# Patient Record
Sex: Female | Born: 1960 | Race: White | Hispanic: No | Marital: Married | Smoking: Never smoker
Health system: Southern US, Community
[De-identification: ages and names within clinical notes are randomized; demographics above are authoritative.]

## PROBLEM LIST (undated history)

## (undated) DIAGNOSIS — K219 Gastro-esophageal reflux disease without esophagitis: Secondary | ICD-10-CM

## (undated) DIAGNOSIS — F419 Anxiety disorder, unspecified: Secondary | ICD-10-CM

## (undated) DIAGNOSIS — I447 Left bundle-branch block, unspecified: Secondary | ICD-10-CM

## (undated) DIAGNOSIS — J309 Allergic rhinitis, unspecified: Secondary | ICD-10-CM

## (undated) DIAGNOSIS — E119 Type 2 diabetes mellitus without complications: Secondary | ICD-10-CM

## (undated) DIAGNOSIS — I10 Essential (primary) hypertension: Secondary | ICD-10-CM

## (undated) DIAGNOSIS — E785 Hyperlipidemia, unspecified: Secondary | ICD-10-CM

## (undated) DIAGNOSIS — C801 Malignant (primary) neoplasm, unspecified: Secondary | ICD-10-CM

## (undated) DIAGNOSIS — R0600 Dyspnea, unspecified: Secondary | ICD-10-CM

## (undated) DIAGNOSIS — J45909 Unspecified asthma, uncomplicated: Secondary | ICD-10-CM

## (undated) DIAGNOSIS — J398 Other specified diseases of upper respiratory tract: Secondary | ICD-10-CM

## (undated) DIAGNOSIS — E039 Hypothyroidism, unspecified: Secondary | ICD-10-CM

## (undated) HISTORY — DX: Unspecified asthma, uncomplicated: J45.909

## (undated) HISTORY — PX: TONSILLECTOMY: SUR1361

## (undated) HISTORY — PX: BREAST SURGERY: SHX581

## (undated) HISTORY — DX: Essential (primary) hypertension: I10

## (undated) HISTORY — DX: Hyperlipidemia, unspecified: E78.5

## (undated) HISTORY — PX: COLONOSCOPY: SHX174

## (undated) HISTORY — DX: Allergic rhinitis, unspecified: J30.9

## (undated) HISTORY — PX: DILATION AND CURETTAGE OF UTERUS: SHX78

## (undated) HISTORY — DX: Left bundle-branch block, unspecified: I44.7

## (undated) HISTORY — DX: Other specified diseases of upper respiratory tract: J39.8

---

## 2017-07-09 DIAGNOSIS — Z9289 Personal history of other medical treatment: Secondary | ICD-10-CM

## 2017-07-09 HISTORY — DX: Personal history of other medical treatment: Z92.89

## 2017-11-22 ENCOUNTER — Institutional Professional Consult (permissible substitution): Payer: Self-pay | Admitting: Pulmonary Disease

## 2017-12-16 ENCOUNTER — Encounter: Payer: Self-pay | Admitting: Emergency Medicine

## 2017-12-16 ENCOUNTER — Ambulatory Visit: Payer: BLUE CROSS/BLUE SHIELD | Admitting: Emergency Medicine

## 2017-12-16 VITALS — BP 120/78 | HR 76 | Ht 61.0 in | Wt 197.4 lb

## 2017-12-16 DIAGNOSIS — R062 Wheezing: Secondary | ICD-10-CM | POA: Diagnosis not present

## 2017-12-16 DIAGNOSIS — R053 Chronic cough: Secondary | ICD-10-CM

## 2017-12-16 DIAGNOSIS — R05 Cough: Secondary | ICD-10-CM | POA: Diagnosis not present

## 2017-12-16 MED ORDER — VALSARTAN 160 MG PO TABS: 160.0000 mg | ORAL_TABLET | Freq: Every day | ORAL | 4 refills | Status: DC

## 2017-12-16 NOTE — Progress Notes (Signed)
Subjective:    Patient ID: Caitlyn Anderson, female    DOB: Jan 11, 1961, 57 y.o.   MRN: [id number]  HPI 57 year old woman, never smoker, with a history of hypertension (on lisinopril) left bundle branch block, diabetes, hypercholesterolemia, allergic rhinitis. She was given a diagnosis of asthma around age 46.   She is a self referral today for SOB, noise with breathing, cough. She has a lot of nasal congestion, underwent allergy testing in the past and was on immunotherapy. May have helped her some. She has anxiety as well, wonders if this could be a contributor.   She describes severe SOB w exertion, even w walking short distance. Localizes noise to her UA nad it is loud, easy to hear by others. She coughs every day, worse in the am and sometimes at night, does not wake her from sleep. She is on flonase. She has been on symbicort for 3 yrs, unclear whether it helps her. She also use to be on loratadine. She has been on lisinopril for several years. She has GERD, uses TUMS about once a week.    Review of Systems  Constitutional: Negative for fever and unexpected weight change.  HENT: Positive for congestion, postnasal drip, rhinorrhea, sinus pressure and sneezing. Negative for dental problem, ear pain, nosebleeds, sore throat and trouble swallowing.   Eyes: Positive for itching. Negative for redness.  Respiratory: Positive for cough, chest tightness, shortness of breath and wheezing.   Cardiovascular: Positive for palpitations. Negative for leg swelling.  Gastrointestinal: Negative for nausea and vomiting.  Genitourinary: Negative for dysuria.  Musculoskeletal: Negative for joint swelling.  Skin: Negative for rash.  Allergic/Immunologic: Positive for environmental allergies. Negative for food allergies and immunocompromised state.  Neurological: Negative for headaches.  Hematological: Does not bruise/bleed easily.  Psychiatric/Behavioral: Negative for dysphoric mood. The patient is  nervous/anxious.    Past Medical History:  Diagnosis Date  . Allergic rhinitis   . Asthma   . Hyperlipidemia   . Hypertension   . Left bundle branch block      No family history on file.   Social History   Socioeconomic History  . Marital status: Married    Spouse name: Not on file  . Number of children: Not on file  . Years of education: Not on file  . Highest education level: Not on file  Occupational History  . Not on file  Social Needs  . Financial resource strain: Not on file  . Food insecurity:    Worry: Not on file    Inability: Not on file  . Transportation needs:    Medical: Not on file    Non-medical: Not on file  Tobacco Use  . Smoking status: Never Smoker  . Smokeless tobacco: Never Used  Substance and Sexual Activity  . Alcohol use: Not on file  . Drug use: Not on file  . Sexual activity: Not on file  Lifestyle  . Physical activity:    Days per week: Not on file    Minutes per session: Not on file  . Stress: Not on file  Relationships  . Social connections:    Talks on phone: Not on file    Gets together: Not on file    Attends religious service: Not on file    Active member of club or organization: Not on file    Attends meetings of clubs or organizations: Not on file    Relationship status: Not on file  . Intimate partner violence:    Fear  of current or ex partner: Not on file    Emotionally abused: Not on file    Physically abused: Not on file    Forced sexual activity: Not on file  Other Topics Concern  . Not on file  Social History Narrative  . 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   Allergies  Allergen Reactions  . Penicillins Hives and Rash     Outpatient Medications Prior to Visit  Medication Sig Dispense Refill  . albuterol (PROAIR HFA) 108 (90 Base) MCG/ACT inhaler Inhale 2 puffs into the lungs 4 (four) times daily as needed.    Marland Kitchen aspirin 81 MG chewable tablet Chew 81 mg  by mouth daily.    Marland Kitchen atorvastatin (LIPITOR) 10 MG tablet Take 2 tablets by mouth daily.    . budesonide-formoterol (SYMBICORT) 160-4.5 MCG/ACT inhaler Take 2 puffs by mouth 2 (two) times daily.    . Cholecalciferol 1000 units capsule Take 1 capsule by mouth daily.    . cycloSPORINE (RESTASIS) 0.05 % ophthalmic emulsion 1 drop 2 (two) times daily.    Marland Kitchen escitalopram (LEXAPRO) 10 MG tablet Take 1 capsule by mouth daily.    . fluticasone (FLONASE) 50 MCG/ACT nasal spray Inhale 1 spray into the lungs daily.    Marland Kitchen levothyroxine (SYNTHROID, LEVOTHROID) 75 MCG tablet Take 75 mcg by mouth daily before breakfast.    . metFORMIN (GLUCOPHAGE) 1000 MG tablet Take 1 capsule by mouth daily.    Marland Kitchen lisinopril (PRINIVIL,ZESTRIL) 10 MG tablet Take 1 capsule by mouth daily.     No facility-administered medications prior to visit.         Objective:   Physical Exam Vitals:   12/16/17 1628 12/16/17 1632  BP:  120/78  Pulse:  76  SpO2:  95%  Weight: 197 lb 6.4 oz (89.5 kg)   Height: 5\' 1"  (1.549 m)    Gen: Pleasant, overwt woman, in no distress,  normal affect  ENT: No lesions,  mouth clear,  oropharynx clear, no postnasal drip  Neck: No JVD, she does have some soft insp and exp stridor  Lungs: No use of accessory muscles, soft exp wheeze or referred UA noise  Cardiovascular: RRR, heart sounds normal, no murmur or gallops, trace peripheral edema  Musculoskeletal: No deformities, no cyanosis or clubbing  Neuro: alert, non focal  Skin: Warm, no lesions or rash      Assessment & Plan:  Wheezing She carries a diagnosis of asthma although she clearly describes upper airway symptoms.  She is not sure that her Symbicort or albuterol ever change her symptoms very much.  She very well may have some asthma but I believe that upper airway instability is driving most of her daily symptoms and problems.  We will need to tease out how much true obstruction she has and decide how to treat.  For now would like  to stop the Symbicort since could be an upper airway irritant.  We will stop her lisinopril and change to valsartan.  We will try to treat allergic rhinitis more aggressively although she may end up needing to see an allergist to be back on immunotherapy.  Unclear how much GERD she may have but she does describe some.  We may decide to start her on empiric GERD therapy as we go forward.  She needs full pulmonary function testing for [place] to determine whether or bronchodilators are indicated.  We will do this  at her next visit.  Please stop Symbicort for now. Keep your albuterol available to use 2 puffs if needed for shortness of breath, chest tightness, cough, wheezing. We will perform full pulmonary function testing at your next visit.  Stop lisinopril We will start valsartan 160 mg once a day. Please continue fluticasone nasal spray, take 2 sprays each nostril once a day. Please start Zyrtec 10 mg once daily. Depending on how you do we may need to refer you back to an allergist to consider allergy shots again. Keep track of how often you have reflux symptoms, how often you use Tums.  We will talk about this at your next visit. Follow with Dr Lamonte Sakai in 1 month or next available with PFT on the same day  Baltazar Apo, MD, PhD 12/16/2017, 5:25 PM [facility] [phone] or if no answer [phone]

## 2017-12-16 NOTE — Patient Instructions (Addendum)
Please stop Symbicort for now. Keep your albuterol available to use 2 puffs if needed for shortness of breath, chest tightness, cough, wheezing. We will perform full pulmonary function testing at your next visit.  Stop lisinopril We will start valsartan 160 mg once a day. Please continue fluticasone nasal spray, take 2 sprays each nostril once a day. Please start Zyrtec 10 mg once daily. Depending on how you do we may need to refer you back to an allergist to consider allergy shots again. Keep track of how often you have reflux symptoms, how often you use Tums.  We will talk about this at your next visit. Follow with Caitlyn Anderson in 1 month or next available with PFT on the same day

## 2017-12-16 NOTE — Assessment & Plan Note (Signed)
She carries a diagnosis of asthma although she clearly describes upper airway symptoms.  She is not sure that her Symbicort or albuterol ever change her symptoms very much.  She very well may have some asthma but I believe that upper airway instability is driving most of her daily symptoms and problems.  We will need to tease out how much true obstruction she has and decide how to treat.  For now would like to stop the Symbicort since could be an upper airway irritant.  We will stop her lisinopril and change to valsartan.  We will try to treat allergic rhinitis more aggressively although she may end up needing to see an allergist to be back on immunotherapy.  Unclear how much GERD she may have but she does describe some.  We may decide to start her on empiric GERD therapy as we go forward.  She needs full pulmonary function testing for [place] to determine whether or bronchodilators are indicated.  We will do this at her next visit.  Please stop Symbicort for now. Keep your albuterol available to use 2 puffs if needed for shortness of breath, chest tightness, cough, wheezing. We will perform full pulmonary function testing at your next visit.  Stop lisinopril We will start valsartan 160 mg once a day. Please continue fluticasone nasal spray, take 2 sprays each nostril once a day. Please start Zyrtec 10 mg once daily. Depending on how you do we may need to refer you back to an allergist to consider allergy shots again. Keep track of how often you have reflux symptoms, how often you use Tums.  We will talk about this at your next visit. Follow with Dr Lamonte Sakai in 1 month or next available with PFT on the same day

## 2018-01-03 ENCOUNTER — Telehealth: Payer: Self-pay | Admitting: Emergency Medicine

## 2018-01-03 MED ORDER — PREDNISONE 10 MG PO TABS: ORAL_TABLET | ORAL | 0 refills | Status: DC

## 2018-01-03 NOTE — Telephone Encounter (Signed)
Called and spoke with patient she is aware. Medication sent in.

## 2018-01-03 NOTE — Telephone Encounter (Signed)
Have her take prednisone 40mg  daily for 5 days. Then call us to let us know if she is improved.

## 2018-01-03 NOTE — Telephone Encounter (Signed)
Called and spoke with patient regarding not breathing well in last 4 days. Pt last seen RB on 12/16/17 for chronic cough. Pt reports increase of SOB at rest and more with exertion, worsening wheezing, dry cough. Pt denies chest tightness, denies any fever or chills. Pt is using albuterol inhaler 2 puffs 3 times daily, valsartan daily, fluticasone daily, and zyrtec daily. Offered a acute ov today; pt denies as she lives in [place]. Pt is requesting if RB could send any medication in to help her feel better.  RB please advise

## 2018-01-28 ENCOUNTER — Ambulatory Visit (INDEPENDENT_AMBULATORY_CARE_PROVIDER_SITE_OTHER): Payer: BLUE CROSS/BLUE SHIELD | Admitting: Emergency Medicine

## 2018-01-28 ENCOUNTER — Ambulatory Visit: Payer: BLUE CROSS/BLUE SHIELD | Admitting: Emergency Medicine

## 2018-01-28 ENCOUNTER — Encounter: Payer: Self-pay | Admitting: Emergency Medicine

## 2018-01-28 DIAGNOSIS — R062 Wheezing: Secondary | ICD-10-CM

## 2018-01-28 DIAGNOSIS — R0600 Dyspnea, unspecified: Secondary | ICD-10-CM | POA: Insufficient documentation

## 2018-01-28 DIAGNOSIS — R0609 Other forms of dyspnea: Secondary | ICD-10-CM

## 2018-01-28 DIAGNOSIS — R05 Cough: Secondary | ICD-10-CM | POA: Diagnosis not present

## 2018-01-28 DIAGNOSIS — R053 Chronic cough: Secondary | ICD-10-CM

## 2018-01-28 LAB — PULMONARY FUNCTION TEST
DL/VA % pred: 93 %
DL/VA: 3.97 ml/min/mmHg/L
DLCO UNC % PRED: 93 %
DLCO UNC: 17.63 ml/min/mmHg
FEF 25-75 POST: 1.05 L/s
FEF 25-75 PRE: 1.33 L/s
FEF2575-%CHANGE-POST: -20 %
FEF2575-%PRED-POST: 48 %
FEF2575-%PRED-PRE: 61 %
FEV1-%Change-Post: -11 %
FEV1-%Pred-Post: 76 %
FEV1-%Pred-Pre: 86 %
FEV1-Post: 1.7 L
FEV1-Pre: 1.92 L
FEV1FVC-%CHANGE-POST: 0 %
FEV1FVC-%PRED-PRE: 83 %
FEV6-%CHANGE-POST: -12 %
FEV6-%Pred-Post: 92 %
FEV6-%Pred-Pre: 105 %
FEV6-POST: 2.54 L
FEV6-Pre: 2.9 L
FEV6FVC-%CHANGE-POST: 0 %
FEV6FVC-%Pred-Post: 102 %
FEV6FVC-%Pred-Pre: 102 %
FVC-%CHANGE-POST: -10 %
FVC-%PRED-POST: 91 %
FVC-%Pred-Pre: 102 %
FVC-Post: 2.61 L
FVC-Pre: 2.93 L
PRE FEV1/FVC RATIO: 65 %
Post FEV1/FVC ratio: 65 %
Post FEV6/FVC ratio: 99 %
Pre FEV6/FVC Ratio: 99 %
RV % pred: 143 %
RV: 2.53 L
TLC % pred: 118 %
TLC: 5.26 L

## 2018-01-28 NOTE — Assessment & Plan Note (Signed)
Exertional shortness of breath that is almost always associated with stridor.  Her pulmonary function testing shows some possible mild obstruction but as mentioned evidence for a fixed obstruction.  I believe this needs to be evaluated as above.  Keep albuterol available to use as needed.  No indication for schedule bronchodilator at this time.

## 2018-01-28 NOTE — Assessment & Plan Note (Signed)
Pulmonary function testing show some mild obstruction, must consider possible presence of mild intermittent asthma.  More interestingly the flow volume loops show evidence for a fixed obstruction in the upper airway.  Certainly she may have a component of VCD or intermittent obstruction but her flow volume loops are all consistent and I am concerned about a fixed lesion.  We talked about this in detail.  We will arrange for a bronchoscopy to inspect her airways.  Continue her albuterol as needed for now.  We will continue to try and treat potential contributors to upper airway irritation including GERD and rhinitis.  We will arrange for bronchoscopy as above.

## 2018-01-28 NOTE — Patient Instructions (Addendum)
Please continue Zyrtec and take his own nasal spray as you have been taking it. Please start Nexium 40 mg daily until next visit.  Take this medication 1 hour before eating. We will not restart Symbicort at this time. Keep albuterol available to use 2 puffs if needed for shortness of breath, wheezing, chest tightness. We will arrange for bronchoscopy to inspect your upper airway, trachea and bronchial tubes. Follow with Dr Lamonte Sakai in 1 month or next available.

## 2018-01-28 NOTE — H&P (View-Only) (Signed)
Subjective:    Patient ID: Caitlyn Anderson, female    DOB: [birth year], 59 y.o.   MRN: [id number]  HPI 57 year old woman, never smoker, with a history of hypertension (on lisinopril) left bundle branch block, diabetes, hypercholesterolemia, allergic rhinitis. She was given a diagnosis of asthma around age 82.   She is a self referral today for SOB, noise with breathing, cough. She has a lot of nasal congestion, underwent allergy testing in the past and was on immunotherapy. May have helped her some. She has anxiety as well, wonders if this could be a contributor.   She describes severe SOB w exertion, even w walking short distance. Localizes noise to her UA nad it is loud, easy to hear by others. She coughs every day, worse in the am and sometimes at night, does not wake her from sleep. She is on flonase. She has been on symbicort for 3 yrs, unclear whether it helps her. She also use to be on loratadine. She has been on lisinopril for several years. She has GERD, uses TUMS about once a week.   ROV 01/28/18 --this is a follow-up visit and a never smoker for dyspnea on exertion, stridor, cough.  At her initial visit I stopped her lisinopril and change to valsartan.  We also stopped Symbicort.  We added Zyrtec to fluticasone nasal spray.  She tolerated stopping Symbicort. Question whether she may have GERD that needs to be treated as well -   Uses Tums.  She reports some increased GERD sx. She reports today that her cough is improved but she continues to have upper airway noise and what sounds like stridor. Since last visit she was treated x 1 with prednisone for increased dyspnea and tightness. Some improvement in PND.   She underwent pulmonary function testing on 7/23 and I have reviewed.  This shows evidence for mild obstruction with out a clear bronchodilator response.  Interestingly her flow volume loop has a pattern consistent with a possible fixed intrathoracic obstruction.  Her lung volumes are  hyperinflated and her diffusion capacity is normal.    Review of Systems  Constitutional: Negative for fever and unexpected weight change.  HENT: Positive for congestion, postnasal drip, rhinorrhea, sinus pressure and sneezing. Negative for dental problem, ear pain, nosebleeds, sore throat and trouble swallowing.   Eyes: Positive for itching. Negative for redness.  Respiratory: Positive for cough, chest tightness, shortness of breath and wheezing.   Cardiovascular: Positive for palpitations. Negative for leg swelling.  Gastrointestinal: Negative for nausea and vomiting.  Genitourinary: Negative for dysuria.  Musculoskeletal: Negative for joint swelling.  Skin: Negative for rash.  Allergic/Immunologic: Positive for environmental allergies. Negative for food allergies and immunocompromised state.  Neurological: Negative for headaches.  Hematological: Does not bruise/bleed easily.  Psychiatric/Behavioral: Negative for dysphoric mood. The patient is nervous/anxious.    Past Medical History:  Diagnosis Date  . Allergic rhinitis   . Asthma   . Hyperlipidemia   . Hypertension   . Left bundle branch block      History reviewed. No pertinent family history.   Social History   Socioeconomic History  . Marital status: Married    Spouse name: Not on file  . Number of children: Not on file  . Years of education: Not on file  . Highest education level: Not on file  Occupational History  . Not on file  Social Needs  . Financial resource strain: Not on file  . Food insecurity:    Worry: Not on  file    Inability: Not on file  . Transportation needs:    Medical: Not on file    Non-medical: Not on file  Tobacco Use  . Smoking status: Never Smoker  . Smokeless tobacco: Never Used  Substance and Sexual Activity  . Alcohol use: Not on file  . Drug use: Not on file  . Sexual activity: Not on file  Lifestyle  . Physical activity:    Days per week: Not on file    Minutes per session:  Not on file  . Stress: Not on file  Relationships  . Social connections:    Talks on phone: Not on file    Gets together: Not on file    Attends religious service: Not on file    Active member of club or organization: Not on file    Attends meetings of clubs or organizations: Not on file    Relationship status: Not on file  . Intimate partner violence:    Fear of current or ex partner: Not on file    Emotionally abused: Not on file    Physically abused: Not on file    Forced sexual activity: Not on file  Other Topics Concern  . Not on file  Social History Narrative  . 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   Allergies  Allergen Reactions  . Penicillins Hives and Rash     Outpatient Medications Prior to Visit  Medication Sig Dispense Refill  . albuterol (PROAIR HFA) 108 (90 Base) MCG/ACT inhaler Inhale 2 puffs into the lungs 4 (four) times daily as needed.    Marland Kitchen aspirin 81 MG chewable tablet Chew 81 mg by mouth daily.    Marland Kitchen atorvastatin (LIPITOR) 10 MG tablet Take 2 tablets by mouth daily.    . budesonide-formoterol (SYMBICORT) 160-4.5 MCG/ACT inhaler Take 2 puffs by mouth 2 (two) times daily.    . Cholecalciferol 1000 units capsule Take 1 capsule by mouth daily.    Marland Kitchen escitalopram (LEXAPRO) 10 MG tablet Take 1 capsule by mouth daily.    . fluticasone (FLONASE) 50 MCG/ACT nasal spray Inhale 1 spray into the lungs daily.    Marland Kitchen levothyroxine (SYNTHROID, LEVOTHROID) 75 MCG tablet Take 75 mcg by mouth daily before breakfast.    . metFORMIN (GLUCOPHAGE) 1000 MG tablet Take 1 capsule by mouth daily.    . valsartan (DIOVAN) 160 MG tablet Take 1 tablet (160 mg total) by mouth daily. 31 tablet 4  . cycloSPORINE (RESTASIS) 0.05 % ophthalmic emulsion 1 drop 2 (two) times daily.    . predniSONE (DELTASONE) 10 MG tablet Take 40 mg daily for 5 days 20 tablet 0   No facility-administered medications prior to visit.         Objective:    Physical Exam Vitals:   01/28/18 1514  BP: 122/84  Pulse: (!) 104  SpO2: 92%   Gen: Pleasant, overwt woman, in no distress,  normal affect  ENT: No lesions,  mouth clear,  oropharynx clear, no postnasal drip  Neck: No JVD, she does have some soft insp and exp stridor  Lungs: No use of accessory muscles, soft exp wheeze or referred UA noise  Cardiovascular: RRR, heart sounds normal, no murmur or gallops, trace peripheral edema  Musculoskeletal: No deformities, no cyanosis or clubbing  Neuro: alert, non focal  Skin: Warm, no lesions or rash      Assessment & Plan:  Wheezing  Pulmonary function testing show some mild obstruction, must consider possible presence of mild intermittent asthma.  More interestingly the flow volume loops show evidence for a fixed obstruction in the upper airway.  Certainly she may have a component of VCD or intermittent obstruction but her flow volume loops are all consistent and I am concerned about a fixed lesion.  We talked about this in detail.  We will arrange for a bronchoscopy to inspect her airways.  Continue her albuterol as needed for now.  We will continue to try and treat potential contributors to upper airway irritation including GERD and rhinitis.  We will arrange for bronchoscopy as above.  Dyspnea Exertional shortness of breath that is almost always associated with stridor.  Her pulmonary function testing shows some possible mild obstruction but as mentioned evidence for a fixed obstruction.  I believe this needs to be evaluated as above.  Keep albuterol available to use as needed.  No indication for schedule bronchodilator at this time.  Baltazar Apo, MD, PhD 01/28/2018, 5:34 PM [facility] [phone] or if no answer [phone]

## 2018-01-28 NOTE — Progress Notes (Signed)
Subjective:    Patient ID: Caitlyn Anderson, female    DOB: 10-Oct-1960, 57 y.o.   MRN: [id number]  HPI 57 year old woman, never smoker, with a history of hypertension (on lisinopril) left bundle branch block, diabetes, hypercholesterolemia, allergic rhinitis. She was given a diagnosis of asthma around age 10.   She is a self referral today for SOB, noise with breathing, cough. She has a lot of nasal congestion, underwent allergy testing in the past and was on immunotherapy. May have helped her some. She has anxiety as well, wonders if this could be a contributor.   She describes severe SOB w exertion, even w walking short distance. Localizes noise to her UA nad it is loud, easy to hear by others. She coughs every day, worse in the am and sometimes at night, does not wake her from sleep. She is on flonase. She has been on symbicort for 3 yrs, unclear whether it helps her. She also use to be on loratadine. She has been on lisinopril for several years. She has GERD, uses TUMS about once a week.   ROV 01/28/18 --this is a follow-up visit and a never smoker for dyspnea on exertion, stridor, cough.  At her initial visit I stopped her lisinopril and change to valsartan.  We also stopped Symbicort.  We added Zyrtec to fluticasone nasal spray.  She tolerated stopping Symbicort. Question whether she may have GERD that needs to be treated as well -   Uses Tums.  She reports some increased GERD sx. She reports today that her cough is improved but she continues to have upper airway noise and what sounds like stridor. Since last visit she was treated x 1 with prednisone for increased dyspnea and tightness. Some improvement in PND.   She underwent pulmonary function testing on 7/23 and I have reviewed.  This shows evidence for mild obstruction with out a clear bronchodilator response.  Interestingly her flow volume loop has a pattern consistent with a possible fixed intrathoracic obstruction.  Her lung volumes are  hyperinflated and her diffusion capacity is normal.    Review of Systems  Constitutional: Negative for fever and unexpected weight change.  HENT: Positive for congestion, postnasal drip, rhinorrhea, sinus pressure and sneezing. Negative for dental problem, ear pain, nosebleeds, sore throat and trouble swallowing.   Eyes: Positive for itching. Negative for redness.  Respiratory: Positive for cough, chest tightness, shortness of breath and wheezing.   Cardiovascular: Positive for palpitations. Negative for leg swelling.  Gastrointestinal: Negative for nausea and vomiting.  Genitourinary: Negative for dysuria.  Musculoskeletal: Negative for joint swelling.  Skin: Negative for rash.  Allergic/Immunologic: Positive for environmental allergies. Negative for food allergies and immunocompromised state.  Neurological: Negative for headaches.  Hematological: Does not bruise/bleed easily.  Psychiatric/Behavioral: Negative for dysphoric mood. The patient is nervous/anxious.    Past Medical History:  Diagnosis Date  . Allergic rhinitis   . Asthma   . Hyperlipidemia   . Hypertension   . Left bundle branch block      History reviewed. No pertinent family history.   Social History   Socioeconomic History  . Marital status: Married    Spouse name: Not on file  . Number of children: Not on file  . Years of education: Not on file  . Highest education level: Not on file  Occupational History  . Not on file  Social Needs  . Financial resource strain: Not on file  . Food insecurity:    Worry: Not on  file    Inability: Not on file  . Transportation needs:    Medical: Not on file    Non-medical: Not on file  Tobacco Use  . Smoking status: Never Smoker  . Smokeless tobacco: Never Used  Substance and Sexual Activity  . Alcohol use: Not on file  . Drug use: Not on file  . Sexual activity: Not on file  Lifestyle  . Physical activity:    Days per week: Not on file    Minutes per session:  Not on file  . Stress: Not on file  Relationships  . Social connections:    Talks on phone: Not on file    Gets together: Not on file    Attends religious service: Not on file    Active member of club or organization: Not on file    Attends meetings of clubs or organizations: Not on file    Relationship status: Not on file  . Intimate partner violence:    Fear of current or ex partner: Not on file    Emotionally abused: Not on file    Physically abused: Not on file    Forced sexual activity: Not on file  Other Topics Concern  . Not on file  Social History Narrative  . 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   Allergies  Allergen Reactions  . Penicillins Hives and Rash     Outpatient Medications Prior to Visit  Medication Sig Dispense Refill  . albuterol (PROAIR HFA) 108 (90 Base) MCG/ACT inhaler Inhale 2 puffs into the lungs 4 (four) times daily as needed.    Marland Kitchen aspirin 81 MG chewable tablet Chew 81 mg by mouth daily.    Marland Kitchen atorvastatin (LIPITOR) 10 MG tablet Take 2 tablets by mouth daily.    . budesonide-formoterol (SYMBICORT) 160-4.5 MCG/ACT inhaler Take 2 puffs by mouth 2 (two) times daily.    . Cholecalciferol 1000 units capsule Take 1 capsule by mouth daily.    Marland Kitchen escitalopram (LEXAPRO) 10 MG tablet Take 1 capsule by mouth daily.    . fluticasone (FLONASE) 50 MCG/ACT nasal spray Inhale 1 spray into the lungs daily.    Marland Kitchen levothyroxine (SYNTHROID, LEVOTHROID) 75 MCG tablet Take 75 mcg by mouth daily before breakfast.    . metFORMIN (GLUCOPHAGE) 1000 MG tablet Take 1 capsule by mouth daily.    . valsartan (DIOVAN) 160 MG tablet Take 1 tablet (160 mg total) by mouth daily. 31 tablet 4  . cycloSPORINE (RESTASIS) 0.05 % ophthalmic emulsion 1 drop 2 (two) times daily.    . predniSONE (DELTASONE) 10 MG tablet Take 40 mg daily for 5 days 20 tablet 0   No facility-administered medications prior to visit.         Objective:    Physical Exam Vitals:   01/28/18 1514  BP: 122/84  Pulse: (!) 104  SpO2: 92%   Gen: Pleasant, overwt woman, in no distress,  normal affect  ENT: No lesions,  mouth clear,  oropharynx clear, no postnasal drip  Neck: No JVD, she does have some soft insp and exp stridor  Lungs: No use of accessory muscles, soft exp wheeze or referred UA noise  Cardiovascular: RRR, heart sounds normal, no murmur or gallops, trace peripheral edema  Musculoskeletal: No deformities, no cyanosis or clubbing  Neuro: alert, non focal  Skin: Warm, no lesions or rash      Assessment & Plan:  Wheezing  Pulmonary function testing show some mild obstruction, must consider possible presence of mild intermittent asthma.  More interestingly the flow volume loops show evidence for a fixed obstruction in the upper airway.  Certainly she may have a component of VCD or intermittent obstruction but her flow volume loops are all consistent and I am concerned about a fixed lesion.  We talked about this in detail.  We will arrange for a bronchoscopy to inspect her airways.  Continue her albuterol as needed for now.  We will continue to try and treat potential contributors to upper airway irritation including GERD and rhinitis.  We will arrange for bronchoscopy as above.  Dyspnea Exertional shortness of breath that is almost always associated with stridor.  Her pulmonary function testing shows some possible mild obstruction but as mentioned evidence for a fixed obstruction.  I believe this needs to be evaluated as above.  Keep albuterol available to use as needed.  No indication for schedule bronchodilator at this time.  Baltazar Apo, MD, PhD 01/28/2018, 5:34 PM [facility] [phone] or if no answer [phone]

## 2018-01-28 NOTE — Progress Notes (Signed)
PFT done today. 

## 2018-02-03 ENCOUNTER — Encounter (HOSPITAL_COMMUNITY): Payer: Self-pay | Admitting: Respiratory Therapy

## 2018-02-03 ENCOUNTER — Ambulatory Visit (HOSPITAL_COMMUNITY)
Admission: RE | Admit: 2018-02-03 | Discharge: 2018-02-03 | Disposition: A | Discharge status: Home / Self Care | Payer: BLUE CROSS/BLUE SHIELD | Source: Ambulatory Visit | Attending: Emergency Medicine | Admitting: Emergency Medicine

## 2018-02-03 ENCOUNTER — Encounter (HOSPITAL_COMMUNITY)
Admission: RE | Disposition: A | Discharge status: Home / Self Care | Payer: Self-pay | Source: Ambulatory Visit | Attending: Emergency Medicine

## 2018-02-03 DIAGNOSIS — J398 Other specified diseases of upper respiratory tract: Secondary | ICD-10-CM

## 2018-02-03 DIAGNOSIS — Z7984 Long term (current) use of oral hypoglycemic drugs: Secondary | ICD-10-CM | POA: Diagnosis not present

## 2018-02-03 DIAGNOSIS — R061 Stridor: Secondary | ICD-10-CM | POA: Diagnosis not present

## 2018-02-03 DIAGNOSIS — R062 Wheezing: Secondary | ICD-10-CM

## 2018-02-03 DIAGNOSIS — K219 Gastro-esophageal reflux disease without esophagitis: Secondary | ICD-10-CM | POA: Diagnosis not present

## 2018-02-03 DIAGNOSIS — R0602 Shortness of breath: Secondary | ICD-10-CM | POA: Insufficient documentation

## 2018-02-03 DIAGNOSIS — I10 Essential (primary) hypertension: Secondary | ICD-10-CM | POA: Diagnosis not present

## 2018-02-03 DIAGNOSIS — F419 Anxiety disorder, unspecified: Secondary | ICD-10-CM | POA: Insufficient documentation

## 2018-02-03 DIAGNOSIS — J45909 Unspecified asthma, uncomplicated: Secondary | ICD-10-CM | POA: Insufficient documentation

## 2018-02-03 DIAGNOSIS — E119 Type 2 diabetes mellitus without complications: Secondary | ICD-10-CM | POA: Diagnosis not present

## 2018-02-03 DIAGNOSIS — E7800 Pure hypercholesterolemia, unspecified: Secondary | ICD-10-CM | POA: Insufficient documentation

## 2018-02-03 DIAGNOSIS — E785 Hyperlipidemia, unspecified: Secondary | ICD-10-CM | POA: Insufficient documentation

## 2018-02-03 DIAGNOSIS — Z7982 Long term (current) use of aspirin: Secondary | ICD-10-CM | POA: Insufficient documentation

## 2018-02-03 DIAGNOSIS — Z880 Allergy status to penicillin: Secondary | ICD-10-CM | POA: Insufficient documentation

## 2018-02-03 DIAGNOSIS — I447 Left bundle-branch block, unspecified: Secondary | ICD-10-CM | POA: Insufficient documentation

## 2018-02-03 DIAGNOSIS — Z79899 Other long term (current) drug therapy: Secondary | ICD-10-CM | POA: Diagnosis not present

## 2018-02-03 DIAGNOSIS — R0609 Other forms of dyspnea: Secondary | ICD-10-CM

## 2018-02-03 HISTORY — PX: VIDEO BRONCHOSCOPY: SHX5072

## 2018-02-03 SURGERY — VIDEO BRONCHOSCOPY WITHOUT FLUORO
Anesthesia: Moderate Sedation | Laterality: Bilateral

## 2018-02-03 MED ORDER — PHENYLEPHRINE HCL 0.25 % NA SOLN
NASAL | Status: DC | PRN
  Administered 2018-02-03: 2 via NASAL

## 2018-02-03 MED ORDER — LIDOCAINE HCL (PF) 1 % IJ SOLN
INTRAMUSCULAR | Status: DC | PRN
  Administered 2018-02-03: 6 mL

## 2018-02-03 MED ORDER — LIDOCAINE HCL URETHRAL/MUCOSAL 2 % EX GEL
CUTANEOUS | Status: DC | PRN
  Administered 2018-02-03: 1

## 2018-02-03 MED ORDER — MIDAZOLAM HCL 10 MG/2ML IJ SOLN
INTRAMUSCULAR | Status: DC | PRN
  Administered 2018-02-03: 1 mg via INTRAVENOUS
  Administered 2018-02-03: 3 mg via INTRAVENOUS
  Administered 2018-02-03: 1 mg via INTRAVENOUS

## 2018-02-03 MED ORDER — MIDAZOLAM HCL 5 MG/ML IJ SOLN
INTRAMUSCULAR | Status: AC
  Filled 2018-02-03: qty 2

## 2018-02-03 MED ORDER — FENTANYL CITRATE (PF) 100 MCG/2ML IJ SOLN
INTRAMUSCULAR | Status: DC | PRN
  Administered 2018-02-03: 50 ug via INTRAVENOUS
  Administered 2018-02-03 (×2): 25 ug via INTRAVENOUS

## 2018-02-03 MED ORDER — FENTANYL CITRATE (PF) 100 MCG/2ML IJ SOLN
INTRAMUSCULAR | Status: AC
  Filled 2018-02-03: qty 4

## 2018-02-03 MED ORDER — SODIUM CHLORIDE 0.9 % IV SOLN
Freq: Once | INTRAVENOUS | Status: AC
  Administered 2018-02-03: 13:00:00 via INTRAVENOUS

## 2018-02-03 NOTE — Discharge Instructions (Signed)
Flexible Bronchoscopy, Care After These instructions give you information on caring for yourself after your procedure. Your doctor may also give you more specific instructions. Call your doctor if you have any problems or questions after your procedure. Follow these instructions at home:  Do not eat or drink anything for 2 hours after your procedure. If you try to eat or drink before the medicine wears off, food or drink could go into your lungs. You could also burn yourself.  After 2 hours have passed and when you can cough and gag normally, you may eat soft food and drink liquids slowly.  The day after the test, you may eat your normal diet.  You may do your normal activities.  Keep all doctor visits. Get help right away if:  You get more and more short of breath.  You get light-headed.  You feel like you are going to pass out (faint).  You have chest pain.  You have new problems that worry you.  You cough up more than a little blood.  You cough up more blood than before.  Do not eat or drink anything until 3:30 pm on 02/03/2018.  Please call our office for any questions or concerns. [phone].    This information is not intended to replace advice given to you by your health care provider. Make sure you discuss any questions you have with your health care provider. Document Released: 04/22/2009 Document Revised: 12/01/2015 Document Reviewed: 02/27/2013 Elsevier Interactive Patient Education  2017 [employer].

## 2018-02-03 NOTE — Interval H&P Note (Signed)
PCCM Interval Note  Patient presents today for further evaluation of her dyspnea and her abnormal spirometry which was suggestive of a possible fixed intrathoracic obstruction.  That she is had slight increase in her shortness of breath since her last visit, has had some increased nasal obstruction and congestion that may be contributing.  No other new issues reported.  Her cough is about the same.  No evidence of dysphasia.  No change in her upper airway wheezing that she hears intermittently especially with exertion.  Risk and benefits of bronchoscopy discussed and explained.  She understands and agrees to proceed.  No barriers identified.  Note she does have some moderate hypertension currently she did take her blood pressure medicine last night per her usual schedule.  I will follow her pressure once sedation is initiated, consider treatment if no resolution.  Baltazar Apo, MD, PhD 02/03/2018, 1:10 PM [facility] [phone] or if no answer [phone]

## 2018-02-03 NOTE — Progress Notes (Signed)
Video bronchoscopy performed.  Intervention bronchial brushing.  No complications noted.  Will continue to monitor.

## 2018-02-03 NOTE — Op Note (Signed)
[facility] Cardiopulmonary Patient Name: Caitlyn Anderson Pocedure Date: 02/03/2018 MRN: [id number] Attending MD: Collene Gobble , MD Date of Birth: 09-Jul-1961 CSN: Finalized Age: 57 Admit Type: Outpatient Gender: Female Procedure:            Bronchoscopy Indications:          Shortness of breath, Stridor Providers:            Collene Gobble, MD, Alice "Alex" Dunlap RRT, RCP, Phillis Knack RRT, RCP Referring MD:          Medicines:            Midazolam 5 mg IV, Fentanyl 573 mcg IV Complications:        No immediate complications Estimated Blood Loss: Estimated blood loss: none. Procedure:            Pre-Anesthesia Assessment:                       - A History and Physical has been performed. Patient                        meds and allergies have been reviewed. The risks and                        benefits of the procedure and the sedation options and                        risks were discussed with the patient. All questions                        were answered and informed consent was obtained.                        Patient identification and proposed procedure were                        verified prior to the procedure by the physician in the                        procedure room. Mental Status Examination: alert and                        oriented. Airway Examination: normal oropharyngeal                        airway. Respiratory Examination: clear to auscultation.                        CV Examination: RRR, no murmurs, no S3 or S4. ASA Grade                        Assessment: II - A patient with mild systemic disease.                        After reviewing the risks and benefits, the patient was                        deemed in  satisfactory condition to undergo the                        procedure. The anesthesia plan was to use moderate                        sedation / analgesia (conscious sedation). Immediately                        prior to  administration of medications, the patient was                        re-assessed for adequacy to receive sedatives. The                        heart rate, respiratory rate, oxygen saturations, blood                        pressure, adequacy of pulmonary ventilation, and                        response to care were monitored throughout the                        procedure. The physical status of the patient was                        re-assessed after the procedure.                       After obtaining informed consent, the bronchoscope was                        passed under direct vision. Throughout the procedure,                        the patient's blood pressure, pulse, and oxygen                        saturations were monitored continuously. the BF-H190                        ([id number]) Olympus Diagnostic Bronchoscope was                        introduced through the right nostril and advanced to                        the carina. The procedure was accomplished without                        difficulty. The patient tolerated the procedure well. Scope In: 1:17:26 PM Scope Out: 1:25:22 PM Findings:      Larynx: GERD findings were visualized.      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 The lesion has a benign, weblike appearance. The airway lumen       is about 80% occluded. The lesion was successfully traversed with some       mild force, but no further inspection could be performed because she  could not move any air with the bronchoscope traversing the obstruction.       There was no significant bleeding noted at the stricture site due to       scope trauma.      Brushings of a lesion were obtained in the trachea with a cytology brush       and sent for routine cytology. One sample was obtained. Impression:           - Shortness of breath                       - Stridor                       - Abnormality in the posterior pharynx  suspected to be                        secondary to gastroesophageal reflux disease (GERD) was                        found.                       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 The                        lesion has a benign, webline appearance.                       - Brushings were obtained at the tracheal stricture. Moderate Sedation:      Moderate (conscious) sedation was personally administered by the       endoscopist. The following parameters were monitored: oxygen saturation,       heart rate, blood pressure, respiratory rate, EKG, adequacy of pulmonary       ventilation, and response to care. Total physician intraservice time was       18 minutes. Recommendation:       - Consider refer to/consult with Thoracic Surgery or                        Interventional Pulmonology.                       - Await brushing results. Procedure Code(s):    --- Professional ---                       [phone], Bronchoscopy, rigid or flexible, including                        fluoroscopic guidance, when performed; with brushing or                        protected brushings                       99152, Moderate sedation services provided by the same                        physician or other qualified health care professional                        performing the diagnostic or therapeutic  service that                        the sedation supports, requiring the presence of an                        independent trained observer to assist in the                        monitoring of the patient's level of consciousness and                        physiological status; initial 15 minutes of                        intraservice time, patient age 63 years or older Diagnosis Code(s):    --- Professional ---                       R06.02, Shortness of breath                       K22.9, Disease of esophagus, unspecified                       J98.4, Other disorders of lung                        R06.1, Stridor CPT copyright 2017 American Medical Association. All rights reserved. The codes documented in this report are preliminary and upon coder review may  be revised to meet current compliance requirements. Collene Gobble, MD Collene Gobble, MD 02/03/2018 1:54:48 PM Number of Addenda: 0

## 2018-02-04 ENCOUNTER — Encounter (HOSPITAL_COMMUNITY): Payer: Self-pay | Admitting: Emergency Medicine

## 2018-02-05 ENCOUNTER — Telehealth: Payer: Self-pay | Admitting: Emergency Medicine

## 2018-02-05 DIAGNOSIS — J398 Other specified diseases of upper respiratory tract: Secondary | ICD-10-CM

## 2018-02-05 NOTE — Telephone Encounter (Signed)
Spoke with pt's husband, Shanon Brow. States that Dr. Lamonte Sakai wants the pt to have surgery for blockage. Reports that he was supposed to call them about getting this set up but they haven't heard anything.  Dr. Lamonte Sakai - please advise. Thanks!

## 2018-02-07 NOTE — Telephone Encounter (Signed)
Patient is calling back and wanted to see if RB found someone to do her surgery and if her results were back. CB is [phone].

## 2018-02-07 NOTE — Telephone Encounter (Signed)
Spoke with the pt's spouse  He is asking about procedure that RB had discussed setting up for pt  Please advise thanks

## 2018-02-10 NOTE — Telephone Encounter (Signed)
Pt is calling back [phone]

## 2018-02-10 NOTE — Telephone Encounter (Signed)
Spoke with pt. She is still waiting to hear back from [facility] about her original message and the results from her biopsy.  RB - please advise. Thanks.

## 2018-02-10 NOTE — Telephone Encounter (Signed)
Pt husband is calling back because he hasn't heard back yet. CB is [phone].

## 2018-02-11 ENCOUNTER — Telehealth: Payer: Self-pay | Admitting: Emergency Medicine

## 2018-02-11 NOTE — Telephone Encounter (Signed)
Pt called back, aware of order placed.  Will come to get labs as she is available.  Nothing further needed.

## 2018-02-11 NOTE — Telephone Encounter (Signed)
Per [facility], pt is needing a BMET lab for CT scans due to pt being diabetic Placed lab order today LVM for pt to return call back in need of having BMET completed

## 2018-02-11 NOTE — Telephone Encounter (Signed)
Pt was calling regarding another phone note.  RB please advise on biopsy results.  Thanks!

## 2018-02-11 NOTE — Addendum Note (Signed)
Addended by: Georjean Mode on: 02/11/2018 03:34 PM   Modules accepted: Orders

## 2018-02-11 NOTE — Telephone Encounter (Signed)
Please let them know that I reviewed her case with Dr Roxan Hockey in Thoracic Surgery to discuss the best next steps. He recommended that we perform a Ct scan of her neck and chest to better define the extent of tracheal narrowing, and then have her review the scans with Dr Roxan Hockey here in [place] to discuss options for procedure here vs referral out of town.   If she agrees please set up CT neck and Ct chest, both with contrast, to evaluate tracheal stricture. And refer her to see Dr Roxan Hockey at [facility] to review the scans.

## 2018-02-11 NOTE — Telephone Encounter (Signed)
Called and spoke with pt regarding RB recommendations Placed orders for CT neck, CT chest both with contrast Evaluate for tracheal stricture Placed referral for Dr. Roxan Hockey TCTS Pt verbalized understanding, and had no further questions. Nothing further needed.

## 2018-02-11 NOTE — Telephone Encounter (Signed)
Pt requesting biopsy results.   RB please advise.  Thanks!

## 2018-02-11 NOTE — Telephone Encounter (Signed)
Pt is calling back [phone]

## 2018-02-14 ENCOUNTER — Other Ambulatory Visit (INDEPENDENT_AMBULATORY_CARE_PROVIDER_SITE_OTHER): Payer: BLUE CROSS/BLUE SHIELD

## 2018-02-14 DIAGNOSIS — J398 Other specified diseases of upper respiratory tract: Secondary | ICD-10-CM

## 2018-02-14 LAB — BASIC METABOLIC PANEL
BUN: 13 mg/dL (ref 6–23)
CALCIUM: 9.8 mg/dL (ref 8.4–10.5)
CO2: 31 mEq/L (ref 19–32)
CREATININE: 1.07 mg/dL (ref 0.40–1.20)
Chloride: 99 mEq/L (ref 96–112)
GFR: 55.78 mL/min — AB (ref >60.00)
Glucose, Bld: 122 mg/dL — ABNORMAL HIGH (ref 70–99)
Potassium: 4 mEq/L (ref 3.5–5.1)
Sodium: 137 mEq/L (ref 135–145)

## 2018-02-19 ENCOUNTER — Ambulatory Visit (INDEPENDENT_AMBULATORY_CARE_PROVIDER_SITE_OTHER)
Admission: RE | Admit: 2018-02-19 | Discharge: 2018-02-19 | Disposition: A | Discharge status: Home / Self Care | Payer: BLUE CROSS/BLUE SHIELD | Source: Ambulatory Visit | Attending: Emergency Medicine | Admitting: Emergency Medicine

## 2018-02-19 DIAGNOSIS — J398 Other specified diseases of upper respiratory tract: Secondary | ICD-10-CM

## 2018-02-19 MED ORDER — IOPAMIDOL (ISOVUE-300) INJECTION 61%
80.0000 mL | Freq: Once | INTRAVENOUS | Status: AC | PRN
  Administered 2018-02-19: 80 mL via INTRAVENOUS

## 2018-02-28 ENCOUNTER — Encounter: Payer: Self-pay | Admitting: Emergency Medicine

## 2018-02-28 ENCOUNTER — Encounter: Payer: BLUE CROSS/BLUE SHIELD | Admitting: Thoracic Surgery (Cardiothoracic Vascular Surgery)

## 2018-02-28 ENCOUNTER — Ambulatory Visit: Payer: BLUE CROSS/BLUE SHIELD | Admitting: Emergency Medicine

## 2018-02-28 DIAGNOSIS — R062 Wheezing: Secondary | ICD-10-CM

## 2018-02-28 DIAGNOSIS — J398 Other specified diseases of upper respiratory tract: Secondary | ICD-10-CM | POA: Diagnosis not present

## 2018-02-28 NOTE — Progress Notes (Signed)
Subjective:    Patient ID: Caitlyn Anderson, female    DOB: [birth year], 57 y.o.   MRN: [id number]  HPI  ROV 02/28/18 --Caitlyn Anderson returns today to discuss her shortness of breath stridor and cough.  She underwent bronchoscopy on 02/03/2018 after her spirometry showed an abnormal flow volume loop consistent with a suspected intrathoracic fixed obstruction.  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  The bronchoscope was able to pass with some difficulty through the remaining orifice and the distal airways appeared to be normal.  Based on this I obtained a CT scan of her neck and chest on 02/19/2018 which I have reviewed.  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 Cytology on the lesion showed atypical cells, no malignancy.  The thoracic trachea and other airways appear to be normal.  I do not see any parenchymal abnormality.  In addition to the work-up above we have continued her on Nexium for any potential contribution of GERD to her upper airway irritation, continued Zyrtec as well.  She is scheduled to see Dr. Roxan Hockey with thoracic surgery next week to discuss the options for addressing the proximal tracheal anatomical obstruction.   For some reason her insurance is rejecting the FOB as an "investigational procedure" - not covering it.    Review of Systems  Constitutional: Negative for fever and unexpected weight change.  HENT: Positive for congestion, postnasal drip, rhinorrhea, sinus pressure and sneezing. Negative for dental problem, ear pain, nosebleeds, sore throat and trouble swallowing.   Eyes: Positive for itching. Negative for redness.  Respiratory: Positive for cough, chest tightness, shortness of breath and wheezing.   Cardiovascular: Positive for palpitations. Negative for leg swelling.  Gastrointestinal: Negative for nausea and vomiting.    Genitourinary: Negative for dysuria.  Musculoskeletal: Negative for joint swelling.  Skin: Negative for rash.  Allergic/Immunologic: Positive for environmental allergies. Negative for food allergies and immunocompromised state.  Neurological: Negative for headaches.  Hematological: Does not bruise/bleed easily.  Psychiatric/Behavioral: Negative for dysphoric mood. The patient is nervous/anxious.    Past Medical History:  Diagnosis Date  . Allergic rhinitis   . Asthma   . Hyperlipidemia   . Hypertension   . Left bundle branch block      No family history on file.   Social History   Socioeconomic History  . Marital status: Married    Spouse name: Not on file  . Number of children: Not on file  . Years of education: Not on file  . Highest education level: Not on file  Occupational History  . Not on file  Social Needs  . Financial resource strain: Not on file  . Food insecurity:    Worry: Not on file    Inability: Not on file  . Transportation needs:    Medical: Not on file    Non-medical: Not on file  Tobacco Use  . Smoking status: Never Smoker  . Smokeless tobacco: Never Used  Substance and Sexual Activity  . Alcohol use: Not on file  . Drug use: Not on file  . Sexual activity: Not on file  Lifestyle  . Physical activity:    Days per week: Not on file    Minutes per session: Not on file  . Stress: Not on file  Relationships  . Social connections:    Talks on phone: Not on file    Gets together: Not on  file    Attends religious service: Not on file    Active member of club or organization: Not on file    Attends meetings of clubs or organizations: Not on file    Relationship status: Not on file  . Intimate partner violence:    Fear of current or ex partner: Not on file    Emotionally abused: Not on file    Physically abused: Not on file    Forced sexual activity: Not on file  Other Topics Concern  . Not on file  Social History Narrative  . Not on file   has done office work.  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   Allergies  Allergen Reactions  . Penicillins Hives and Rash     Outpatient Medications Prior to Visit  Medication Sig Dispense Refill  . albuterol (PROAIR HFA) 108 (90 Base) MCG/ACT inhaler Inhale 2 puffs into the lungs 4 (four) times daily as needed.    Marland Kitchen aspirin 81 MG chewable tablet Chew 81 mg by mouth daily.    Marland Kitchen atorvastatin (LIPITOR) 10 MG tablet Take 2 tablets by mouth daily.    . budesonide-formoterol (SYMBICORT) 160-4.5 MCG/ACT inhaler Take 2 puffs by mouth 2 (two) times daily.    . Cholecalciferol 1000 units capsule Take 1 capsule by mouth daily.    Marland Kitchen escitalopram (LEXAPRO) 10 MG tablet Take 1 capsule by mouth daily.    . fluticasone (FLONASE) 50 MCG/ACT nasal spray Inhale 1 spray into the lungs daily.    Marland Kitchen levothyroxine (SYNTHROID, LEVOTHROID) 75 MCG tablet Take 75 mcg by mouth daily before breakfast.    . metFORMIN (GLUCOPHAGE) 1000 MG tablet Take 1 capsule by mouth daily.    . valsartan (DIOVAN) 160 MG tablet Take 1 tablet (160 mg total) by mouth daily. 31 tablet 4   No facility-administered medications prior to visit.         Objective:   Physical Exam Vitals:   02/28/18 1210  BP: (!) 162/74  Pulse: (!) 134  SpO2: 94%  Weight: 194 lb (88 kg)  Height: 5' (1.524 m)   Gen: Pleasant, overwt woman, in no distress,  normal affect  ENT: No lesions,  mouth clear,  oropharynx clear, no postnasal drip  Neck: No JVD, clear no stridor  Lungs: No use of accessory muscles, no stridor today, no referred noise  Cardiovascular: RRR, heart sounds normal, no murmur or gallops, trace peripheral edema  Musculoskeletal: No deformities, no cyanosis or clubbing  Neuro: alert, non focal  Skin: Warm, no lesions or rash      Assessment & Plan:  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  Her CT scan of the neck shows that this is apparently a thin web.   I referred her to see Dr. Roxan Hockey with thoracic surgery to discuss the options that may include laser debridement, balloon dilation, etc. Once performed I can repeat her spirometry to establish a new baseline, allow [place] to follow for any interval recurrence.    Wheezing Suspect that this is principally upper airway but spirometry after any definitive tracheal procedure will allow me to look for any underlying mild asthma or obstructive disease.  Baltazar Apo, MD, PhD 02/28/2018, 12:28 PM [facility] [phone] or if no answer [phone]

## 2018-02-28 NOTE — Assessment & Plan Note (Signed)
Suspected based on spirometry and then confirmed on bronchoscopy.  Her CT scan of the neck shows that this is apparently a thin web.  I referred her to see Dr. Roxan Hockey with thoracic surgery to discuss the options that may include laser debridement, balloon dilation, etc. Once performed I can repeat her spirometry to establish a new baseline, allow [place] to follow for any interval recurrence.

## 2018-02-28 NOTE — Patient Instructions (Signed)
Please keep your appointment with Dr. Roxan Hockey next week.  I suspect that there is a procedure that can be performed to help open your tracheal blockage and make your breathing better. We will investigate and try to assist with insurance coverage for your bronchoscopy.  Not clear to me why this was not covered adequately. Continue your other medications as you are taking them for now. We will probably repeat your spirometry after any procedure by Dr. Roxan Hockey to establish a baseline and to investigate for any possible underlying asthma that is currently difficult to detect. Follow with Dr Lamonte Sakai in 2 months or sooner if you have any problems.

## 2018-02-28 NOTE — Assessment & Plan Note (Signed)
Suspect that this is principally upper airway but spirometry after any definitive tracheal procedure will allow me to look for any underlying mild asthma or obstructive disease.

## 2018-03-03 ENCOUNTER — Encounter: Payer: Self-pay | Admitting: Thoracic Surgery (Cardiothoracic Vascular Surgery)

## 2018-03-03 ENCOUNTER — Other Ambulatory Visit: Payer: Self-pay

## 2018-03-03 ENCOUNTER — Institutional Professional Consult (permissible substitution): Payer: BLUE CROSS/BLUE SHIELD | Admitting: Thoracic Surgery (Cardiothoracic Vascular Surgery)

## 2018-03-03 VITALS — BP 122/74 | HR 85 | Resp 18 | Ht 60.0 in | Wt 194.0 lb

## 2018-03-03 DIAGNOSIS — J398 Other specified diseases of upper respiratory tract: Secondary | ICD-10-CM | POA: Diagnosis not present

## 2018-03-03 NOTE — Progress Notes (Signed)
PCP is Dairl Ponder, MD Referring Provider is Collene Gobble, MD  Chief Complaint  Patient presents with  . Shortness of Breath    stridor, cough...referral for tracheal stricture.Marland KitchenMarland KitchenCT NECK/CHEST 02/19/18, BRONCH 02/03/18    HPI: Caitlyn Anderson is sent for consultation regarding a tracheal stricture.  Caitlyn Anderson is a 57 year old woman with a past medical history significant for hypertension, hyperlipidemia, type 2 diabetes, a left bundle branch block, allergies, arthritis, obesity, hypothyroidism, and "asthma."  He says she is been having difficulty with asthma and wheezing for many years.  It has gotten worse over the past few years.  She gets short of breath with exertion and feels tired much of the time.  She has not had any response to bronchodilators.  She saw Dr. West Carbo.  Her pulmonary function testing was consistent with a fixed upper airway obstruction.  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  The lesion appeared benign grossly.  Brushings showed atypical cells.  CT of the chest and neck was done.  It showed a simple stricture 2 cm below the larynx.  It also showed an asymmetrically prominent left tonsil without a discrete mass.  She had a tonsillectomy as a child  Zubrod Score: At the time of surgery this patient's most appropriate activity status/level should be described as: []     0    Normal activity, no symptoms [x]     1    Restricted in physical strenuous activity but ambulatory, able to do out light work []     2    Ambulatory and capable of self care, unable to do work activities, up and about >50 % of waking hours                              []     3    Only limited self care, in bed greater than 50% of waking hours []     4    Completely disabled, no self care, confined to bed or chair []     5    Moribund   Past Medical History:  Diagnosis Date  . Allergic rhinitis   . Asthma   .  Hyperlipidemia   . Hypertension   . Left bundle branch block     Past Surgical History:  Procedure Laterality Date  . BREAST SURGERY    . CESAREAN SECTION    . DILATION AND CURETTAGE OF UTERUS    . TONSILLECTOMY    . VIDEO BRONCHOSCOPY Bilateral 02/03/2018   Procedure: VIDEO BRONCHOSCOPY WITHOUT FLUORO;  Surgeon: Collene Gobble, MD;  Location: [facility] ENDOSCOPY;  Service: Cardiopulmonary;  Laterality: Bilateral;    No family history on file.  Social History Social History   Tobacco Use  . Smoking status: Never Smoker  . Smokeless tobacco: Never Used  Substance Use Topics  . Alcohol use: Not on file  . Drug use: Not on file    Current Outpatient Medications  Medication Sig Dispense Refill  . albuterol (PROAIR HFA) 108 (90 Base) MCG/ACT inhaler Inhale 2 puffs into the lungs 4 (four) times daily as needed.    Marland Kitchen aspirin 81 MG chewable tablet Chew 81 mg by mouth daily.    Marland Kitchen atorvastatin (LIPITOR) 10 MG tablet Take 2 tablets by mouth daily.    . cetirizine (ZYRTEC) 10 MG tablet Take 10 mg by mouth daily.    Marland Kitchen  Cholecalciferol 1000 units capsule Take 1 capsule by mouth daily.    Marland Kitchen escitalopram (LEXAPRO) 10 MG tablet Take 1 capsule by mouth daily.    . fluticasone (FLONASE) 50 MCG/ACT nasal spray Inhale 1 spray into the lungs daily.    Marland Kitchen levothyroxine (SYNTHROID, LEVOTHROID) 75 MCG tablet Take 75 mcg by mouth daily before breakfast.    . metFORMIN (GLUCOPHAGE) 1000 MG tablet Take 1 capsule by mouth daily.    . valsartan (DIOVAN) 160 MG tablet Take 1 tablet (160 mg total) by mouth daily. 31 tablet 4   No current facility-administered medications for this visit.     Allergies  Allergen Reactions  . Penicillins Hives and Rash    Review of Systems  Constitutional: Positive for fatigue. Negative for activity change and unexpected weight change.  HENT: Negative for trouble swallowing and voice change.   Respiratory: Positive for cough, shortness of breath, wheezing and stridor.    Gastrointestinal: Positive for abdominal pain (reflux).  Genitourinary: Negative for difficulty urinating and dysuria.  Musculoskeletal: Positive for arthralgias and gait problem.  Neurological: Positive for numbness (hands). Negative for syncope.  Hematological: Negative for adenopathy. Does not bruise/bleed easily.  All other systems reviewed and are negative.   BP 122/74 (BP Location: Right Arm, Patient Position: Sitting, Cuff Size: Large)   Pulse 85   Resp 18   Ht 5' (1.524 m)   Wt 194 lb (88 kg)   SpO2 94% Comment: ON RA  BMI 37.89 kg/m  Physical Exam  Constitutional: She is oriented to person, place, and time. She appears well-developed and well-nourished. No distress.  HENT:  Head: Normocephalic and atraumatic.  Mouth/Throat: No oropharyngeal exudate.  Eyes: Pupils are equal, round, and reactive to light. Conjunctivae and EOM are normal. No scleral icterus.  Neck: Neck supple. No thyromegaly present.  Cardiovascular: Normal rate, regular rhythm, normal heart sounds and intact distal pulses. Exam reveals no gallop and no friction rub.  No murmur heard. Pulmonary/Chest: Effort normal. Stridor present. No respiratory distress. She has no wheezes. She has no rales.  Abdominal: Soft. She exhibits no distension. There is no tenderness.  Musculoskeletal: She exhibits no edema or deformity.  Lymphadenopathy:    She has no cervical adenopathy.  Neurological: She is alert and oriented to person, place, and time. No cranial nerve deficit. She exhibits normal muscle tone. Coordination normal.  Skin: Skin is warm and dry.  Vitals reviewed.    Diagnostic Tests: CT NECK WITH CONTRAST  TECHNIQUE: Multidetector CT imaging of the neck was performed using the standard protocol following the bolus administration of intravenous contrast.  CONTRAST:  63mL ISOVUE-300 IOPAMIDOL (ISOVUE-300) INJECTION 61%  COMPARISON:  None.  FINDINGS: PHARYNX AND LARYNX: Asymmetrically prominent  LEFT lingual tonsil without discrete mass. Normal larynx. Linear soft tissue proximal trachea, approximately 2 cm below the larynx with mural puckering.  SALIVARY GLANDS: A trophic submandibular glands. Fatty replaced parotid glands.  THYROID: Normal.  LYMPH NODES: No lymphadenopathy by CT size criteria.  VASCULAR: Mild calcific atherosclerosis RIGHT carotid bifurcation.  LIMITED INTRACRANIAL: Normal.  VISUALIZED ORBITS: Normal.  MASTOIDS AND VISUALIZED PARANASAL SINUSES: Trace paranasal sinus mucosal thickening without air-fluid levels. Minimal RIGHT mastoid effusion.  SKELETON: Nonacute. Moderate to severe C5-6 and C6-7 degenerative discs with multilevel moderate facet arthropathy. Severe RIGHT C3-4 facet arthropathy.  UPPER CHEST: Lung apices are clear. No superior mediastinal lymphadenopathy.  OTHER: None.  IMPRESSION: 1. 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 2. Asymmetrically prominent LEFT lingual tonsil, recommend direct  inspection.   Electronically Signed   By: Elon Alas M.D.   On: 02/20/2018 05:06 I personally reviewed the Ct images and concur with the findings noted above  Pulmonary function testing FVC= 2.93 (102%) FEV1= 1.92(86%) Worse with bronchodilator dlco 17.63 (93%)  Impression: Mrs. Reffitt is a 57 year old woman with a long history of "asthma."  Her symptoms have progressed over the past few years.  She saw Dr. Lamonte Sakai who noted that she had evidence of a fixed upper airway obstruction.  Bronchoscopy revealed a well within the trachea a couple of centimeters distal to the vocal cords.  The airway appeared normal beyond that.  He felt that this had a benign appearance.  Brushings showed atypical cells but based on the photograph this does not look like a tumor.  Given the relatively discrete nature of the stenosis I think endoscopic treatment would be the first choice.  Surgical resection is also an option, but  with significantly more morbidity.  There is a higher likelihood of recurrence with endoscopic treatment but surgery would remain an option if needed down the road.  I described the post procedure Mrs. Dulude and her husband.  They understand this would be endoscopic in nature.  They understand there is no guarantee of a complete success.  They understand there is a possibility of recurrence.  We discussed the indications, risk, benefits, and alternatives.  They understand the risks include, but not limited to death, loss of airway, hypoxic injury, MI, stroke, bleeding, airway fire, as well as the possibility of other unforeseeable complications.  She understands that the risk of these types of complications are very small.  Asymmetric left tonsil on CT.  She had a tonsillectomy as a child.  This may just be postsurgical changes, but I think this should be evaluated by ENT before we do any interventional bronchoscopy procedure.  Plan: Referral to ENT to evaluate asymmetric left tonsil noted on CT.  Flexible and rigid bronchoscopy and possible laser bronchoscopy for treatment of tracheal stenosis.  Melrose Nakayama, MD [facility] [phone]

## 2018-03-03 NOTE — H&P (View-Only) (Signed)
PCP is Dairl Ponder, MD Referring Provider is Collene Gobble, MD  Chief Complaint  Patient presents with  . Shortness of Breath    stridor, cough...referral for tracheal stricture.Marland KitchenMarland KitchenCT NECK/CHEST 02/19/18, BRONCH 02/03/18    HPI: Caitlyn Anderson is sent for consultation regarding a tracheal stricture.  Caitlyn Anderson is a 57 year old woman with a past medical history significant for hypertension, hyperlipidemia, type 2 diabetes, a left bundle branch block, allergies, arthritis, obesity, hypothyroidism, and "asthma."  He says she is been having difficulty with asthma and wheezing for many years.  It has gotten worse over the past few years.  She gets short of breath with exertion and feels tired much of the time.  She has not had any response to bronchodilators.  She saw Dr. West Carbo.  Her pulmonary function testing was consistent with a fixed upper airway obstruction.  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  The lesion appeared benign grossly.  Brushings showed atypical cells.  CT of the chest and neck was done.  It showed a simple stricture 2 cm below the larynx.  It also showed an asymmetrically prominent left tonsil without a discrete mass.  She had a tonsillectomy as a child  Zubrod Score: At the time of surgery this patient's most appropriate activity status/level should be described as: []     0    Normal activity, no symptoms [x]     1    Restricted in physical strenuous activity but ambulatory, able to do out light work []     2    Ambulatory and capable of self care, unable to do work activities, up and about >50 % of waking hours                              []     3    Only limited self care, in bed greater than 50% of waking hours []     4    Completely disabled, no self care, confined to bed or chair []     5    Moribund   Past Medical History:  Diagnosis Date  . Allergic rhinitis   . Asthma   .  Hyperlipidemia   . Hypertension   . Left bundle branch block     Past Surgical History:  Procedure Laterality Date  . BREAST SURGERY    . CESAREAN SECTION    . DILATION AND CURETTAGE OF UTERUS    . TONSILLECTOMY    . VIDEO BRONCHOSCOPY Bilateral 02/03/2018   Procedure: VIDEO BRONCHOSCOPY WITHOUT FLUORO;  Surgeon: Collene Gobble, MD;  Location: [facility] END[facility];  Service: Cardiopulmonary;  Laterality: Bilateral;    No family history on file.  Social History Social History   Tobacco Use  . Smoking status: Never Smoker  . Smokeless tobacco: Never Used  Substance Use Topics  . Alcohol use: Not on file  . Drug use: Not on file    Current Outpatient Medications  Medication Sig Dispense Refill  . albuterol (PROAIR HFA) 108 (90 Base) MCG/ACT inhaler Inhale 2 puffs into the lungs 4 (four) times daily as needed.    Marland Kitchen aspirin 81 MG chewable tablet Chew 81 mg by mouth daily.    Marland Kitchen atorvastatin (LIPITOR) 10 MG tablet Take 2 tablets by mouth daily.    . cetirizine (ZYRTEC) 10 MG tablet Take 10 mg by mouth daily.    Marland Kitchen  Cholecalciferol 1000 units capsule Take 1 capsule by mouth daily.    Marland Kitchen escitalopram (LEXAPRO) 10 MG tablet Take 1 capsule by mouth daily.    . fluticasone (FLONASE) 50 MCG/ACT nasal spray Inhale 1 spray into the lungs daily.    Marland Kitchen levothyroxine (SYNTHROID, LEVOTHROID) 75 MCG tablet Take 75 mcg by mouth daily before breakfast.    . metFORMIN (GLUCOPHAGE) 1000 MG tablet Take 1 capsule by mouth daily.    . valsartan (DIOVAN) 160 MG tablet Take 1 tablet (160 mg total) by mouth daily. 31 tablet 4   No current facility-administered medications for this visit.     Allergies  Allergen Reactions  . Penicillins Hives and Rash    Review of Systems  Constitutional: Positive for fatigue. Negative for activity change and unexpected weight change.  HENT: Negative for trouble swallowing and voice change.   Respiratory: Positive for cough, shortness of breath, wheezing and stridor.    Gastrointestinal: Positive for abdominal pain (reflux).  Genitourinary: Negative for difficulty urinating and dysuria.  Musculoskeletal: Positive for arthralgias and gait problem.  Neurological: Positive for numbness (hands). Negative for syncope.  Hematological: Negative for adenopathy. Does not bruise/bleed easily.  All other systems reviewed and are negative.   BP 122/74 (BP Location: Right Arm, Patient Position: Sitting, Cuff Size: Large)   Pulse 85   Resp 18   Ht 5' (1.524 m)   Wt 194 lb (88 kg)   SpO2 94% Comment: ON RA  BMI 37.89 kg/m  Physical Exam  Constitutional: She is oriented to person, place, and time. She appears well-developed and well-nourished. No distress.  HENT:  Head: Normocephalic and atraumatic.  Mouth/Throat: No oropharyngeal exudate.  Eyes: Pupils are equal, round, and reactive to light. Conjunctivae and EOM are normal. No scleral icterus.  Neck: Neck supple. No thyromegaly present.  Cardiovascular: Normal rate, regular rhythm, normal heart sounds and intact distal pulses. Exam reveals no gallop and no friction rub.  No murmur heard. Pulmonary/Chest: Effort normal. Stridor present. No respiratory distress. She has no wheezes. She has no rales.  Abdominal: Soft. She exhibits no distension. There is no tenderness.  Musculoskeletal: She exhibits no edema or deformity.  Lymphadenopathy:    She has no cervical adenopathy.  Neurological: She is alert and oriented to person, place, and time. No cranial nerve deficit. She exhibits normal muscle tone. Coordination normal.  Skin: Skin is warm and dry.  Vitals reviewed.    Diagnostic Tests: CT NECK WITH CONTRAST  TECHNIQUE: Multidetector CT imaging of the neck was performed using the standard protocol following the bolus administration of intravenous contrast.  CONTRAST:  85mL ISOVUE-300 IOPAMIDOL (ISOVUE-300) INJECTION 61%  COMPARISON:  None.  FINDINGS: PHARYNX AND LARYNX: Asymmetrically prominent  LEFT lingual tonsil without discrete mass. Normal larynx. Linear soft tissue proximal trachea, approximately 2 cm below the larynx with mural puckering.  SALIVARY GLANDS: A trophic submandibular glands. Fatty replaced parotid glands.  THYROID: Normal.  LYMPH NODES: No lymphadenopathy by CT size criteria.  VASCULAR: Mild calcific atherosclerosis RIGHT carotid bifurcation.  LIMITED INTRACRANIAL: Normal.  VISUALIZED ORBITS: Normal.  MASTOIDS AND VISUALIZED PARANASAL SINUSES: Trace paranasal sinus mucosal thickening without air-fluid levels. Minimal RIGHT mastoid effusion.  SKELETON: Nonacute. Moderate to severe C5-6 and C6-7 degenerative discs with multilevel moderate facet arthropathy. Severe RIGHT C3-4 facet arthropathy.  UPPER CHEST: Lung apices are clear. No superior mediastinal lymphadenopathy.  OTHER: None.  IMPRESSION: 1. 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 2. Asymmetrically prominent LEFT lingual tonsil, recommend direct  inspection.   Electronically Signed   By: Elon Alas M.D.   On: 02/20/2018 05:06 I personally reviewed the Ct images and concur with the findings noted above  Pulmonary function testing FVC= 2.93 (102%) FEV1= 1.92(86%) Worse with bronchodilator dlco 17.63 (93%)  Impression: Caitlyn Anderson is a 57 year old woman with a long history of "asthma."  Her symptoms have progressed over the past few years.  She saw Dr. Lamonte Sakai who noted that she had evidence of a fixed upper airway obstruction.  Bronchoscopy revealed a well within the trachea a couple of centimeters distal to the vocal cords.  The airway appeared normal beyond that.  He felt that this had a benign appearance.  Brushings showed atypical cells but based on the photograph this does not look like a tumor.  Given the relatively discrete nature of the stenosis I think endoscopic treatment would be the first choice.  Surgical resection is also an option, but  with significantly more morbidity.  There is a higher likelihood of recurrence with endoscopic treatment but surgery would remain an option if needed down the road.  I described the post procedure Caitlyn Anderson and her husband.  They understand this would be endoscopic in nature.  They understand there is no guarantee of a complete success.  They understand there is a possibility of recurrence.  We discussed the indications, risk, benefits, and alternatives.  They understand the risks include, but not limited to death, loss of airway, hypoxic injury, MI, stroke, bleeding, airway fire, as well as the possibility of other unforeseeable complications.  She understands that the risk of these types of complications are very small.  Asymmetric left tonsil on CT.  She had a tonsillectomy as a child.  This may just be postsurgical changes, but I think this should be evaluated by ENT before we do any interventional bronchoscopy procedure.  Plan: Referral to ENT to evaluate asymmetric left tonsil noted on CT.  Flexible and rigid bronchoscopy and possible laser bronchoscopy for treatment of tracheal stenosis.  Melrose Nakayama, MD [facility] [phone]

## 2018-03-14 ENCOUNTER — Other Ambulatory Visit: Payer: Self-pay | Admitting: *Deleted

## 2018-03-14 DIAGNOSIS — J398 Other specified diseases of upper respiratory tract: Secondary | ICD-10-CM

## 2018-03-21 ENCOUNTER — Other Ambulatory Visit: Payer: Self-pay

## 2018-03-21 ENCOUNTER — Encounter (HOSPITAL_COMMUNITY): Payer: Self-pay | Admitting: *Deleted

## 2018-03-21 NOTE — Progress Notes (Signed)
Caitlyn Anderson has type II DM, she does not check CBG. Patient's PCP is Dr Norville Haggard, cardiologist is Dr Toula Moos , both in [place].  I have requested records from both offices.  Caitlyn Anderson reports that she has a BBB, but does not have any problems with her heart.

## 2018-03-24 ENCOUNTER — Ambulatory Visit (HOSPITAL_COMMUNITY)
Admission: RE | Admit: 2018-03-24 | Discharge: 2018-03-24 | Disposition: A | Discharge status: Home / Self Care | Payer: BLUE CROSS/BLUE SHIELD | Source: Ambulatory Visit | Attending: Thoracic Surgery (Cardiothoracic Vascular Surgery) | Admitting: Thoracic Surgery (Cardiothoracic Vascular Surgery)

## 2018-03-24 ENCOUNTER — Ambulatory Visit (HOSPITAL_COMMUNITY): Payer: BLUE CROSS/BLUE SHIELD | Admitting: Certified Registered"

## 2018-03-24 ENCOUNTER — Encounter (HOSPITAL_COMMUNITY): Payer: Self-pay | Admitting: *Deleted

## 2018-03-24 ENCOUNTER — Encounter (HOSPITAL_COMMUNITY)
Admission: RE | Disposition: A | Discharge status: Home / Self Care | Payer: Self-pay | Source: Ambulatory Visit | Attending: Thoracic Surgery (Cardiothoracic Vascular Surgery)

## 2018-03-24 ENCOUNTER — Ambulatory Visit (HOSPITAL_COMMUNITY): Payer: BLUE CROSS/BLUE SHIELD

## 2018-03-24 DIAGNOSIS — M1990 Unspecified osteoarthritis, unspecified site: Secondary | ICD-10-CM | POA: Insufficient documentation

## 2018-03-24 DIAGNOSIS — Z7951 Long term (current) use of inhaled steroids: Secondary | ICD-10-CM | POA: Insufficient documentation

## 2018-03-24 DIAGNOSIS — I10 Essential (primary) hypertension: Secondary | ICD-10-CM | POA: Insufficient documentation

## 2018-03-24 DIAGNOSIS — E119 Type 2 diabetes mellitus without complications: Secondary | ICD-10-CM | POA: Diagnosis not present

## 2018-03-24 DIAGNOSIS — I447 Left bundle-branch block, unspecified: Secondary | ICD-10-CM | POA: Insufficient documentation

## 2018-03-24 DIAGNOSIS — J398 Other specified diseases of upper respiratory tract: Secondary | ICD-10-CM | POA: Insufficient documentation

## 2018-03-24 DIAGNOSIS — Z6838 Body mass index (BMI) 38.0-38.9, adult: Secondary | ICD-10-CM | POA: Diagnosis not present

## 2018-03-24 DIAGNOSIS — J45909 Unspecified asthma, uncomplicated: Secondary | ICD-10-CM | POA: Diagnosis not present

## 2018-03-24 DIAGNOSIS — E039 Hypothyroidism, unspecified: Secondary | ICD-10-CM | POA: Diagnosis not present

## 2018-03-24 DIAGNOSIS — E669 Obesity, unspecified: Secondary | ICD-10-CM | POA: Insufficient documentation

## 2018-03-24 DIAGNOSIS — Z7984 Long term (current) use of oral hypoglycemic drugs: Secondary | ICD-10-CM | POA: Insufficient documentation

## 2018-03-24 DIAGNOSIS — Z7982 Long term (current) use of aspirin: Secondary | ICD-10-CM | POA: Diagnosis not present

## 2018-03-24 DIAGNOSIS — Z79899 Other long term (current) drug therapy: Secondary | ICD-10-CM | POA: Diagnosis not present

## 2018-03-24 DIAGNOSIS — E785 Hyperlipidemia, unspecified: Secondary | ICD-10-CM | POA: Diagnosis not present

## 2018-03-24 HISTORY — PX: LASER BRONCHOSCOPY: SHX6534

## 2018-03-24 HISTORY — DX: Gastro-esophageal reflux disease without esophagitis: K21.9

## 2018-03-24 HISTORY — PX: FLEXIBLE BRONCHOSCOPY: SHX5094

## 2018-03-24 HISTORY — DX: Type 2 diabetes mellitus without complications: E11.9

## 2018-03-24 HISTORY — DX: Dyspnea, unspecified: R06.00

## 2018-03-24 HISTORY — DX: Hypothyroidism, unspecified: E03.9

## 2018-03-24 LAB — COMPREHENSIVE METABOLIC PANEL
ALK PHOS: 66 U/L (ref 38–126)
ALT: 24 U/L (ref 0–44)
ANION GAP: 11 (ref 5–15)
AST: 25 U/L (ref 15–41)
Albumin: 3.9 g/dL (ref 3.5–5.0)
BUN: 11 mg/dL (ref 6–20)
CO2: 23 mmol/L (ref 22–32)
Calcium: 9.6 mg/dL (ref 8.9–10.3)
Chloride: 106 mmol/L (ref 98–111)
Creatinine, Ser: 0.86 mg/dL (ref 0.44–1.00)
GFR calc non Af Amer: >60 mL/min (ref >60)
Glucose, Bld: 149 mg/dL — ABNORMAL HIGH (ref 70–99)
POTASSIUM: 4 mmol/L (ref 3.5–5.1)
SODIUM: 140 mmol/L (ref 135–145)
Total Bilirubin: 0.7 mg/dL (ref 0.3–1.2)
Total Protein: 7.2 g/dL (ref 6.5–8.1)

## 2018-03-24 LAB — CBC
HCT: 46 % (ref 36.0–46.0)
Hemoglobin: 15.5 g/dL — ABNORMAL HIGH (ref 12.0–15.0)
MCH: 32 pg (ref 26.0–34.0)
MCHC: 33.7 g/dL (ref 30.0–36.0)
MCV: 95 fL (ref 78.0–100.0)
PLATELETS: 260 10*3/uL (ref 150–400)
RBC: 4.84 MIL/uL (ref 3.87–5.11)
RDW: 12.5 % (ref 11.5–15.5)
WBC: 8.5 10*3/uL (ref 4.0–10.5)

## 2018-03-24 LAB — GLUCOSE, CAPILLARY
GLUCOSE-CAPILLARY: 140 mg/dL — AB (ref 70–99)
Glucose-Capillary: 129 mg/dL — ABNORMAL HIGH (ref 70–99)

## 2018-03-24 LAB — PROTIME-INR
INR: 1
Prothrombin Time: 13.1 seconds (ref 11.4–15.2)

## 2018-03-24 LAB — APTT: APTT: 36 s (ref 24–36)

## 2018-03-24 SURGERY — BRONCHOSCOPY, FLEXIBLE
Anesthesia: General

## 2018-03-24 MED ORDER — ONDANSETRON HCL 4 MG/2ML IJ SOLN
INTRAMUSCULAR | Status: DC | PRN
  Administered 2018-03-24: 4 mg via INTRAVENOUS

## 2018-03-24 MED ORDER — FENTANYL CITRATE (PF) 100 MCG/2ML IJ SOLN
INTRAMUSCULAR | Status: DC | PRN
  Administered 2018-03-24 (×2): 50 ug via INTRAVENOUS

## 2018-03-24 MED ORDER — ROCURONIUM BROMIDE 50 MG/5ML IV SOSY
PREFILLED_SYRINGE | INTRAVENOUS | Status: AC
  Filled 2018-03-24: qty 10

## 2018-03-24 MED ORDER — MIDAZOLAM HCL 2 MG/2ML IJ SOLN
INTRAMUSCULAR | Status: AC
  Filled 2018-03-24: qty 2

## 2018-03-24 MED ORDER — 0.9 % SODIUM CHLORIDE (POUR BTL) OPTIME
TOPICAL | Status: DC | PRN
  Administered 2018-03-24: 1000 mL

## 2018-03-24 MED ORDER — PROPOFOL 500 MG/50ML IV EMUL
INTRAVENOUS | Status: DC | PRN
  Administered 2018-03-24: 75 ug/kg/min via INTRAVENOUS

## 2018-03-24 MED ORDER — OXYCODONE HCL 5 MG PO TABS: 5.0000 mg | ORAL_TABLET | Freq: Once | ORAL | Status: DC | PRN

## 2018-03-24 MED ORDER — OXYCODONE HCL 5 MG/5ML PO SOLN: 5.0000 mg | Freq: Once | ORAL | Status: DC | PRN

## 2018-03-24 MED ORDER — SODIUM CHLORIDE 0.9 % IV SOLN
INTRAVENOUS | Status: DC | PRN
  Administered 2018-03-24: 25 ug/min via INTRAVENOUS

## 2018-03-24 MED ORDER — ONDANSETRON HCL 4 MG/2ML IJ SOLN: 4.0000 mg | Freq: Four times a day (QID) | INTRAMUSCULAR | Status: DC | PRN

## 2018-03-24 MED ORDER — EPINEPHRINE PF 1 MG/ML IJ SOLN
INTRAMUSCULAR | Status: AC
  Filled 2018-03-24: qty 1

## 2018-03-24 MED ORDER — LIDOCAINE 2% (20 MG/ML) 5 ML SYRINGE
INTRAMUSCULAR | Status: AC
  Filled 2018-03-24: qty 5

## 2018-03-24 MED ORDER — LACTATED RINGERS IV SOLN
INTRAVENOUS | Status: DC
  Administered 2018-03-24: 10:00:00 via INTRAVENOUS

## 2018-03-24 MED ORDER — MIDAZOLAM HCL 2 MG/2ML IJ SOLN
INTRAMUSCULAR | Status: DC | PRN
  Administered 2018-03-24: 2 mg via INTRAVENOUS

## 2018-03-24 MED ORDER — PHENYLEPHRINE 40 MCG/ML (10ML) SYRINGE FOR IV PUSH (FOR BLOOD PRESSURE SUPPORT)
PREFILLED_SYRINGE | INTRAVENOUS | Status: DC | PRN
  Administered 2018-03-24: 40 ug via INTRAVENOUS
  Administered 2018-03-24: 80 ug via INTRAVENOUS
  Administered 2018-03-24 (×2): 40 ug via INTRAVENOUS
  Administered 2018-03-24: 80 ug via INTRAVENOUS
  Administered 2018-03-24: 40 ug via INTRAVENOUS

## 2018-03-24 MED ORDER — LIDOCAINE 2% (20 MG/ML) 5 ML SYRINGE
INTRAMUSCULAR | Status: DC | PRN
  Administered 2018-03-24: 60 mg via INTRAVENOUS

## 2018-03-24 MED ORDER — STERILE WATER FOR IRRIGATION IR SOLN
Status: DC | PRN
  Administered 2018-03-24: 1000 mL

## 2018-03-24 MED ORDER — FENTANYL CITRATE (PF) 250 MCG/5ML IJ SOLN
INTRAMUSCULAR | Status: AC
  Filled 2018-03-24: qty 5

## 2018-03-24 MED ORDER — PHENYLEPHRINE 40 MCG/ML (10ML) SYRINGE FOR IV PUSH (FOR BLOOD PRESSURE SUPPORT)
PREFILLED_SYRINGE | INTRAVENOUS | Status: AC
  Filled 2018-03-24: qty 10

## 2018-03-24 MED ORDER — ROCURONIUM BROMIDE 10 MG/ML (PF) SYRINGE
PREFILLED_SYRINGE | INTRAVENOUS | Status: DC | PRN
  Administered 2018-03-24: 15 mg via INTRAVENOUS
  Administered 2018-03-24: 50 mg via INTRAVENOUS

## 2018-03-24 MED ORDER — PROPOFOL 10 MG/ML IV BOLUS
INTRAVENOUS | Status: DC | PRN
  Administered 2018-03-24: 160 mg via INTRAVENOUS

## 2018-03-24 MED ORDER — PROPOFOL 1000 MG/100ML IV EMUL
INTRAVENOUS | Status: AC
  Filled 2018-03-24: qty 100

## 2018-03-24 MED ORDER — DEXAMETHASONE SODIUM PHOSPHATE 10 MG/ML IJ SOLN
INTRAMUSCULAR | Status: DC | PRN
  Administered 2018-03-24: 10 mg via INTRAVENOUS

## 2018-03-24 MED ORDER — ONDANSETRON HCL 4 MG/2ML IJ SOLN
INTRAMUSCULAR | Status: AC
  Filled 2018-03-24: qty 2

## 2018-03-24 MED ORDER — PROPOFOL 10 MG/ML IV BOLUS
INTRAVENOUS | Status: AC
  Filled 2018-03-24: qty 20

## 2018-03-24 MED ORDER — FENTANYL CITRATE (PF) 100 MCG/2ML IJ SOLN: 25.0000 ug | INTRAMUSCULAR | Status: DC | PRN

## 2018-03-24 MED ORDER — SUGAMMADEX SODIUM 200 MG/2ML IV SOLN
INTRAVENOUS | Status: DC | PRN
  Administered 2018-03-24: 200 mg via INTRAVENOUS

## 2018-03-24 MED ORDER — ONDANSETRON HCL 4 MG/2ML IJ SOLN: INTRAMUSCULAR | Status: DC | PRN

## 2018-03-24 MED ORDER — DEXAMETHASONE SODIUM PHOSPHATE 10 MG/ML IJ SOLN
INTRAMUSCULAR | Status: AC
  Filled 2018-03-24: qty 1

## 2018-03-24 SURGICAL SUPPLY — 41 items
ADAPTER CATH SYR TO TUBING 38M (ADAPTER) ×3 IMPLANT
BNDG GAUZE ELAST 4 BULKY (GAUZE/BANDAGES/DRESSINGS) IMPLANT
CANISTER SUCT 3000ML PPV (MISCELLANEOUS) ×3 IMPLANT
CONT SPEC 4OZ CLIKSEAL STRL BL (MISCELLANEOUS) ×12 IMPLANT
COVER BACK TABLE 60X90IN (DRAPES) ×3 IMPLANT
DRAPE INCISE IOBAN 66X45 STRL (DRAPES) IMPLANT
FILTER STRAW FLUID ASPIR (MISCELLANEOUS) IMPLANT
FORCEPS BIOP RJ4 1.8 (CUTTING FORCEPS) ×3 IMPLANT
FORCEPS RADIAL JAW LRG 4 PULM (INSTRUMENTS) IMPLANT
GAS CARTRIDGE  LASER (MISCELLANEOUS) ×3 IMPLANT
GAUZE SPONGE 4X4 12PLY STRL (GAUZE/BANDAGES/DRESSINGS) ×3 IMPLANT
GAUZE VASELINE FOILPK 1/2 X 72 (GAUZE/BANDAGES/DRESSINGS) IMPLANT
GLOVE SURG SIGNA 7.5 PF LTX (GLOVE) ×3 IMPLANT
GOWN STRL REUS W/ TWL LRG LVL3 (GOWN DISPOSABLE) ×1 IMPLANT
GOWN STRL REUS W/ TWL XL LVL3 (GOWN DISPOSABLE) ×1 IMPLANT
GOWN STRL REUS W/TWL LRG LVL3 (GOWN DISPOSABLE) ×2
GOWN STRL REUS W/TWL XL LVL3 (GOWN DISPOSABLE) ×2
GUARD TEETH (MISCELLANEOUS) IMPLANT
KIT BASIN OR (CUSTOM PROCEDURE TRAY) ×3 IMPLANT
KIT CLEAN ENDO COMPLIANCE (KITS) ×3 IMPLANT
KIT TURNOVER KIT B (KITS) ×3 IMPLANT
LASER FIBER FLEXIBLE (MISCELLANEOUS) IMPLANT
LASER FIBER SLIMLINE 550 DISP (MISCELLANEOUS) ×3 IMPLANT
NEEDLE BLUNT 16X1.5 OR ONLY (NEEDLE) ×3 IMPLANT
NS IRRIG 1000ML POUR BTL (IV SOLUTION) ×6 IMPLANT
PAD ARMBOARD 7.5X6 YLW CONV (MISCELLANEOUS) ×6 IMPLANT
PAD EYE OVAL STERILE LF (GAUZE/BANDAGES/DRESSINGS) IMPLANT
RADIAL JAW LRG 4 PULMONARY (INSTRUMENTS)
SNARE SHORT THROW 13M SML OVAL (MISCELLANEOUS) IMPLANT
SOLUTION ANTI FOG 6CC (MISCELLANEOUS) ×3 IMPLANT
SYR 20ML ECCENTRIC (SYRINGE) ×9 IMPLANT
SYR BULB IRRIGATION 50ML (SYRINGE) ×3 IMPLANT
SYR TOOMEY 50ML (SYRINGE) ×3 IMPLANT
TOWEL GREEN STERILE (TOWEL DISPOSABLE) ×3 IMPLANT
TOWEL GREEN STERILE FF (TOWEL DISPOSABLE) ×3 IMPLANT
TRAP SPECIMEN MUCOUS 40CC (MISCELLANEOUS) ×3 IMPLANT
TUBE CONNECTING 12'X1/4 (SUCTIONS) ×1
TUBE CONNECTING 12X1/4 (SUCTIONS) ×2 IMPLANT
TUBE CONNECTING 20'X1/4 (TUBING) ×1
TUBE CONNECTING 20X1/4 (TUBING) ×2 IMPLANT
WATER STERILE IRR 1000ML POUR (IV SOLUTION) IMPLANT

## 2018-03-24 NOTE — Interval H&P Note (Signed)
History and Physical Interval Note:  03/24/2018 10:25 AM  Caitlyn Anderson  has presented today for surgery, with the diagnosis of TRACHEAL STENOSIS  The various methods of treatment have been discussed with the patient and family. After consideration of risks, benefits and other options for treatment, the patient has consented to  Procedure(s): FLEXIBLE BRONCHOSCOPY (N/A) RIGID BRONCHOSCOPY (N/A) possible LASER BRONCHOSCOPY FOR RESECTION OF TRACHEAL WEB (N/A) as a surgical intervention .  The patient's history has been reviewed, patient examined, no change in status, stable for surgery.  I have reviewed the patient's chart and labs.  Questions were answered to the patient's satisfaction.     Melrose Nakayama

## 2018-03-24 NOTE — Anesthesia Procedure Notes (Signed)
Procedure Name: Intubation Date/Time: 03/24/2018 11:54 AM Performed by: Barrington Ellison, CRNA Pre-anesthesia Checklist: Patient identified, Emergency Drugs available, Suction available and Patient being monitored Patient Re-evaluated:Patient Re-evaluated prior to induction Oxygen Delivery Method: Circle System Utilized Preoxygenation: Pre-oxygenation with 100% oxygen Induction Type: IV induction Ventilation: Mask ventilation without difficulty Laryngoscope Size: Mac and 3 Grade View: Grade II Tube type: Oral Tube size: 7.0 mm Number of attempts: 3 Airway Equipment and Method: Stylet and Oral airway Placement Confirmation: ETT inserted through vocal cords under direct vision,  positive ETCO2 and breath sounds checked- equal and bilateral Secured at: 19 cm Tube secured with: Tape Dental Injury: Teeth and Oropharynx as per pre-operative assessment  Difficulty Due To: Difficulty was anticipated Comments: Intubation by MDA on 3rd attempt, very narrow airway, difficulty passing cuff thru cords, recommend small ETT for future intubations

## 2018-03-24 NOTE — Brief Op Note (Signed)
03/24/2018  12:26 PM  PATIENT:  Caitlyn Anderson  57 y.o. female  PRE-OPERATIVE DIAGNOSIS:  TRACHEAL STENOSIS  POST-OPERATIVE DIAGNOSIS:  TRACHEAL STENOSIS  PROCEDURE:  Procedure(s): FLEXIBLE BRONCHOSCOPY (N/A) LASER BRONCHOSCOPY FOR RESECTION OF TRACHEAL WEB (N/A)  SURGEON:  Surgeon(s) and Role:    * Melrose Nakayama, MD - Primary  PHYSICIAN ASSISTANT:   ASSISTANTS: none   ANESTHESIA:   general  EBL:  3 mL   BLOOD ADMINISTERED:none  DRAINS: none   LOCAL MEDICATIONS USED:  NONE  SPECIMEN:  Source of Specimen:  tracheal web  DISPOSITION OF SPECIMEN:  PATHOLOGY  COUNTS:  NO endoscopic  TOURNIQUET:  * No tourniquets in log *  DICTATION: .Other Dictation: Dictation Number -  PLAN OF CARE: Discharge to home after PACU  PATIENT DISPOSITION:  PACU - hemodynamically stable.   Delay start of Pharmacological VTE agent (>24hrs) due to surgical blood loss or risk of bleeding: not applicable

## 2018-03-24 NOTE — Op Note (Signed)
NAME: Caitlyn Anderson, Caitlyn Anderson MEDICAL RECORD HB:[id number] ACCOUNT [id number] DATE OF BIRTH:[birth year] FACILITY: [facility] LOCATION: [facility] PHYSICIAN:Norman Bier Chaya Jan, MD  OPERATIVE REPORT  DATE OF PROCEDURE:  03/24/2018  PREOPERATIVE DIAGNOSIS:  Tracheal stenosis.  POSTOPERATIVE DIAGNOSIS:  Tracheal stenosis.  PROCEDURE:  Flexible fiberoptic bronchoscopy with laser resection of tracheal web.  SURGEON:  Remo Lipps Jaqwan Wieber,MD  ASSISTANT:  None.  ANESTHESIA:  General.  FINDINGS:  A web-like stenosis in the trachea just below the vocal cords.  Biopsies sent for permanent pathology.  Widely patent trachea post-resection.  CLINICAL NOTE:  The patient is a 57 year old woman who presented with a history of wheezing and shortness of breath which had worsened recently.  Pulmonary function testing was consistent with a fixed airway obstruction.  Dr. Baltazar Apo did  bronchoscopy and noted a tracheal web, which narrowed the trachea by approximately 80% brushings showed atypical cells, but the lesion appeared grossly benign.  The patient was advised to undergo bronchoscopy and possible rigid bronchoscopy for laser  ablation of the tracheal web.  The indications, risks, benefits, and alternatives were discussed in detail with the patient.  She understood and accepted the risks and agreed to proceed.  DESCRIPTION OF PROCEDURE:  The patient brought to the operating room on 03/24/2018.  She had induction of general anesthesia by Dr. Marcie Bal.  She was bag mask ventilated intermittently.  Ventilation was halted and the bronchoscope was inserted.  The  bronchoscope passed the cords easily.  There was a web-like stenosis approximately 8-9 cm below the cords.  Biopsies were taken from 2 sites and sent for pathology.  Saturations were monitored closely by anesthesia and when the saturations dropped the  bronchoscope was removed and the bag mask ventilation with room air was repeated.  This was done repeatedly  throughout the procedure.  The bronchoscope then was placed and the laser fiber was placed.  It was noted that there was extensive thick  secretions distal to the airway stenosis, laser ablation was performed of the tracheal web.  At times the biopsy forceps were used to remove the tissue; at other times of the laser essentially vaporized the tissue.  The trachea was widely patent.  A  final inspection revealed no bleeding.  The bronchoscope was removed for the final time, the patient was intubated.    She then was gradually awakened from general anesthesia and was extubated in the operating room and taken to the [facility] in good condition.  AN/NUANCE  D:03/24/2018 T:03/24/2018 JOB:[id number]

## 2018-03-24 NOTE — Anesthesia Preprocedure Evaluation (Addendum)
Anesthesia Evaluation  Patient identified by MRN, date of birth, ID band Patient awake    Reviewed: Allergy & Precautions, H&P , NPO status , Patient's Chart, lab work & pertinent test results  Airway Mallampati: II   Neck ROM: full    Dental  (+) Teeth Intact, Dental Advisory Given   Pulmonary shortness of breath, asthma ,    breath sounds clear to auscultation       Cardiovascular hypertension,  Rhythm:regular Rate:Normal  LBBB   Neuro/Psych    GI/Hepatic GERD  ,  Endo/Other  diabetes, Type 2Hypothyroidism obese  Renal/GU      Musculoskeletal   Abdominal   Peds  Hematology   Anesthesia Other Findings   Reproductive/Obstetrics                            Anesthesia Physical Anesthesia Plan  ASA: II  Anesthesia Plan: General   Post-op Pain Management:    Induction: Intravenous  PONV Risk Score and Plan: 3 and Ondansetron, Dexamethasone, Midazolam and Treatment may vary due to age or medical condition  Airway Management Planned: Oral ETT  Additional Equipment:   Intra-op Plan:   Post-operative Plan:   Informed Consent: I have reviewed the patients History and Physical, chart, labs and discussed the procedure including the risks, benefits and alternatives for the proposed anesthesia with the patient or authorized representative who has indicated his/her understanding and acceptance.     Plan Discussed with: CRNA, Anesthesiologist and Surgeon  Anesthesia Plan Comments:         Anesthesia Quick Evaluation

## 2018-03-24 NOTE — Transfer of Care (Signed)
Immediate Anesthesia Transfer of Care Note  Patient: Caitlyn Anderson  Procedure(s) Performed: FLEXIBLE BRONCHOSCOPY (N/A ) LASER BRONCHOSCOPY FOR RESECTION OF TRACHEAL WEB (N/A )  Patient Location: PACU  Anesthesia Type:General  Level of Consciousness: awake, alert  and oriented  Airway & Oxygen Therapy: Patient Spontanous Breathing and Patient connected to nasal cannula oxygen  Post-op Assessment: Report given to RN  Post vital signs: Reviewed and stable  Last Vitals:  Vitals Value Taken Time  BP 111/70 03/24/2018 12:13 PM  Temp 36.3 C 03/24/2018 12:14 PM  Pulse 78 03/24/2018 12:22 PM  Resp 16 03/24/2018 12:22 PM  SpO2 97 % 03/24/2018 12:22 PM  Vitals shown include unvalidated device data.  Last Pain:  Vitals:   03/24/18 1214  TempSrc:   PainSc: 3       Patients Stated Pain Goal: 4 (78/67/54 [id number])  Complications: No apparent anesthesia complications

## 2018-03-24 NOTE — Discharge Instructions (Signed)
Do not drive or engage in heavy physical activity for 24 hours  You may cough up small amounts of blood over the next few days  You may use acetaminophen (Tylenol) if needed for discomfort  Call [phone] if you develop chest pain, shortness of breath, fever > 101 F or cough up more than 2 tablespoons of blood  My office will contact you with a follow up appointment

## 2018-03-25 ENCOUNTER — Encounter (HOSPITAL_COMMUNITY): Payer: Self-pay | Admitting: Thoracic Surgery (Cardiothoracic Vascular Surgery)

## 2018-03-25 NOTE — Anesthesia Postprocedure Evaluation (Signed)
Anesthesia Post Note  Patient: Caitlyn Anderson  Procedure(s) Performed: FLEXIBLE BRONCHOSCOPY (N/A ) LASER BRONCHOSCOPY FOR RESECTION OF TRACHEAL WEB (N/A )     Patient location during evaluation: PACU Anesthesia Type: General Level of consciousness: awake and alert Pain management: pain level controlled Vital Signs Assessment: post-procedure vital signs reviewed and stable Respiratory status: spontaneous breathing, nonlabored ventilation, respiratory function stable and patient connected to nasal cannula oxygen Cardiovascular status: blood pressure returned to baseline and stable Postop Assessment: no apparent nausea or vomiting Anesthetic complications: no    Last Vitals:  Vitals:   03/24/18 1230 03/24/18 1251  BP: (!) 127/116 115/83  Pulse: 74 74  Resp: 13 15  Temp:  36.7 C  SpO2: 98% 94%    Last Pain:  Vitals:   03/24/18 1251  TempSrc: Oral  PainSc: 2                  Trace Wirick S

## 2018-03-28 ENCOUNTER — Telehealth: Payer: Self-pay

## 2018-03-28 NOTE — Telephone Encounter (Signed)
Attempted to call patient and husband, Kinn.  Patient's husband called and left a message at the office stating that his wife was coughing and coughing up fluid and stated she was having "trouble".  Call both the patient's number listed and her husbands number.  Voicemail was left.  Will continue to monitor.

## 2018-03-29 ENCOUNTER — Other Ambulatory Visit: Payer: Self-pay

## 2018-03-29 ENCOUNTER — Emergency Department (HOSPITAL_COMMUNITY): Payer: BLUE CROSS/BLUE SHIELD

## 2018-03-29 ENCOUNTER — Inpatient Hospital Stay (HOSPITAL_COMMUNITY)
Admission: EM | Admit: 2018-03-29 | Discharge: 2018-04-03 | DRG: 156 | Disposition: A | Discharge status: Home / Self Care | Payer: BLUE CROSS/BLUE SHIELD | Attending: Family Medicine | Admitting: Family Medicine

## 2018-03-29 ENCOUNTER — Encounter (HOSPITAL_COMMUNITY): Payer: Self-pay | Admitting: Emergency Medicine

## 2018-03-29 DIAGNOSIS — Z7951 Long term (current) use of inhaled steroids: Secondary | ICD-10-CM

## 2018-03-29 DIAGNOSIS — K219 Gastro-esophageal reflux disease without esophagitis: Secondary | ICD-10-CM | POA: Diagnosis not present

## 2018-03-29 DIAGNOSIS — T380X5A Adverse effect of glucocorticoids and synthetic analogues, initial encounter: Secondary | ICD-10-CM | POA: Diagnosis not present

## 2018-03-29 DIAGNOSIS — M1612 Unilateral primary osteoarthritis, left hip: Secondary | ICD-10-CM | POA: Diagnosis present

## 2018-03-29 DIAGNOSIS — J398 Other specified diseases of upper respiratory tract: Secondary | ICD-10-CM | POA: Diagnosis present

## 2018-03-29 DIAGNOSIS — Z79890 Hormone replacement therapy (postmenopausal): Secondary | ICD-10-CM

## 2018-03-29 DIAGNOSIS — J386 Stenosis of larynx: Principal | ICD-10-CM | POA: Diagnosis present

## 2018-03-29 DIAGNOSIS — D72829 Elevated white blood cell count, unspecified: Secondary | ICD-10-CM | POA: Diagnosis present

## 2018-03-29 DIAGNOSIS — I447 Left bundle-branch block, unspecified: Secondary | ICD-10-CM | POA: Diagnosis not present

## 2018-03-29 DIAGNOSIS — E039 Hypothyroidism, unspecified: Secondary | ICD-10-CM | POA: Diagnosis present

## 2018-03-29 DIAGNOSIS — E785 Hyperlipidemia, unspecified: Secondary | ICD-10-CM | POA: Diagnosis not present

## 2018-03-29 DIAGNOSIS — Z8249 Family history of ischemic heart disease and other diseases of the circulatory system: Secondary | ICD-10-CM

## 2018-03-29 DIAGNOSIS — Z23 Encounter for immunization: Secondary | ICD-10-CM

## 2018-03-29 DIAGNOSIS — Z7982 Long term (current) use of aspirin: Secondary | ICD-10-CM | POA: Diagnosis not present

## 2018-03-29 DIAGNOSIS — I10 Essential (primary) hypertension: Secondary | ICD-10-CM | POA: Diagnosis present

## 2018-03-29 DIAGNOSIS — J45909 Unspecified asthma, uncomplicated: Secondary | ICD-10-CM | POA: Diagnosis present

## 2018-03-29 DIAGNOSIS — E876 Hypokalemia: Secondary | ICD-10-CM | POA: Diagnosis present

## 2018-03-29 DIAGNOSIS — E1165 Type 2 diabetes mellitus with hyperglycemia: Secondary | ICD-10-CM | POA: Diagnosis present

## 2018-03-29 DIAGNOSIS — Z79899 Other long term (current) drug therapy: Secondary | ICD-10-CM | POA: Diagnosis not present

## 2018-03-29 DIAGNOSIS — F419 Anxiety disorder, unspecified: Secondary | ICD-10-CM | POA: Diagnosis present

## 2018-03-29 DIAGNOSIS — R061 Stridor: Secondary | ICD-10-CM | POA: Diagnosis not present

## 2018-03-29 DIAGNOSIS — Z880 Allergy status to penicillin: Secondary | ICD-10-CM

## 2018-03-29 DIAGNOSIS — R5381 Other malaise: Secondary | ICD-10-CM

## 2018-03-29 LAB — CBC WITH DIFFERENTIAL/PLATELET
Basophils Absolute: 0 10*3/uL (ref 0.0–0.1)
Basophils Relative: 0 %
EOS PCT: 2 %
Eosinophils Absolute: 0.3 10*3/uL (ref 0.0–0.7)
HCT: 41 % (ref 36.0–46.0)
HEMOGLOBIN: 14 g/dL (ref 12.0–15.0)
LYMPHS ABS: 2.2 10*3/uL (ref 0.7–4.0)
Lymphocytes Relative: 17 %
MCH: 32.7 pg (ref 26.0–34.0)
MCHC: 34.1 g/dL (ref 30.0–36.0)
MCV: 95.8 fL (ref 78.0–100.0)
MONOS PCT: 7 %
Monocytes Absolute: 0.9 10*3/uL (ref 0.1–1.0)
NEUTROS PCT: 74 %
Neutro Abs: 9.6 10*3/uL — ABNORMAL HIGH (ref 1.7–7.7)
Platelets: 265 10*3/uL (ref 150–400)
RBC: 4.28 MIL/uL (ref 3.87–5.11)
RDW: 13 % (ref 11.5–15.5)
WBC: 13 10*3/uL — ABNORMAL HIGH (ref 4.0–10.5)

## 2018-03-29 LAB — BASIC METABOLIC PANEL
Anion gap: 9 (ref 5–15)
BUN: 10 mg/dL (ref 6–20)
CHLORIDE: 101 mmol/L (ref 98–111)
CO2: 28 mmol/L (ref 22–32)
CREATININE: 0.75 mg/dL (ref 0.44–1.00)
Calcium: 9.2 mg/dL (ref 8.9–10.3)
GFR calc Af Amer: >60 mL/min (ref >60)
GFR calc non Af Amer: >60 mL/min (ref >60)
Glucose, Bld: 143 mg/dL — ABNORMAL HIGH (ref 70–99)
Potassium: 3.7 mmol/L (ref 3.5–5.1)
SODIUM: 138 mmol/L (ref 135–145)

## 2018-03-29 LAB — TROPONIN I: Troponin I: <0.03 ng/mL (ref <0.03)

## 2018-03-29 MED ORDER — IPRATROPIUM-ALBUTEROL 0.5-2.5 (3) MG/3ML IN SOLN
3.0000 mL | Freq: Once | RESPIRATORY_TRACT | Status: AC
  Administered 2018-03-29: 3 mL via RESPIRATORY_TRACT
  Filled 2018-03-29: qty 3

## 2018-03-29 MED ORDER — IOPAMIDOL (ISOVUE-300) INJECTION 61%
100.0000 mL | Freq: Once | INTRAVENOUS | Status: AC | PRN
  Administered 2018-03-29: 100 mL via INTRAVENOUS

## 2018-03-29 MED ORDER — LEVOTHYROXINE SODIUM 100 MCG IV SOLR
37.5000 ug | Freq: Every day | INTRAVENOUS | Status: DC
  Administered 2018-03-30: 37.5 ug via INTRAVENOUS
  Filled 2018-03-29: qty 5

## 2018-03-29 MED ORDER — HEPARIN SODIUM (PORCINE) 5000 UNIT/ML IJ SOLN: 5000.0000 [IU] | Freq: Three times a day (TID) | INTRAMUSCULAR | Status: DC

## 2018-03-29 MED ORDER — INSULIN ASPART 100 UNIT/ML ~~LOC~~ SOLN
0.0000 [IU] | SUBCUTANEOUS | Status: DC
  Administered 2018-03-30: 8 [IU] via SUBCUTANEOUS
  Administered 2018-03-30: 5 [IU] via SUBCUTANEOUS
  Administered 2018-03-30 (×3): 3 [IU] via SUBCUTANEOUS
  Administered 2018-03-30: 5 [IU] via SUBCUTANEOUS
  Administered 2018-03-31: 2 [IU] via SUBCUTANEOUS
  Administered 2018-03-31: 3 [IU] via SUBCUTANEOUS
  Administered 2018-03-31: 5 [IU] via SUBCUTANEOUS
  Administered 2018-03-31 (×2): 3 [IU] via SUBCUTANEOUS
  Administered 2018-04-01: 2 [IU] via SUBCUTANEOUS
  Administered 2018-04-01: 3 [IU] via SUBCUTANEOUS
  Administered 2018-04-01: 2 [IU] via SUBCUTANEOUS

## 2018-03-29 MED ORDER — SODIUM CHLORIDE 0.9 % IV SOLN: INTRAVENOUS | Status: DC

## 2018-03-29 MED ORDER — RACEPINEPHRINE HCL 2.25 % IN NEBU: 0.5000 mL | INHALATION_SOLUTION | RESPIRATORY_TRACT | Status: DC | PRN

## 2018-03-29 MED ORDER — METHYLPREDNISOLONE SODIUM SUCC 125 MG IJ SOLR
125.0000 mg | Freq: Once | INTRAMUSCULAR | Status: AC
  Administered 2018-03-29: 125 mg via INTRAVENOUS
  Filled 2018-03-29: qty 2

## 2018-03-29 MED ORDER — METHYLPREDNISOLONE SODIUM SUCC 125 MG IJ SOLR
60.0000 mg | Freq: Four times a day (QID) | INTRAMUSCULAR | Status: DC
  Administered 2018-03-29 – 2018-03-30 (×3): 60 mg via INTRAVENOUS
  Filled 2018-03-29 (×3): qty 2

## 2018-03-29 MED ORDER — HEPARIN SODIUM (PORCINE) 5000 UNIT/ML IJ SOLN
5000.0000 [IU] | Freq: Three times a day (TID) | INTRAMUSCULAR | Status: DC
  Administered 2018-03-30 – 2018-04-03 (×11): 5000 [IU] via SUBCUTANEOUS
  Filled 2018-03-29 (×11): qty 1

## 2018-03-29 MED ORDER — LORAZEPAM 2 MG/ML IJ SOLN
0.5000 mg | Freq: Four times a day (QID) | INTRAMUSCULAR | Status: DC | PRN
  Administered 2018-03-30 – 2018-03-31 (×2): 0.5 mg via INTRAVENOUS
  Filled 2018-03-29 (×2): qty 1

## 2018-03-29 MED ORDER — ALBUTEROL SULFATE (2.5 MG/3ML) 0.083% IN NEBU: 2.5000 mg | INHALATION_SOLUTION | RESPIRATORY_TRACT | Status: DC | PRN

## 2018-03-29 MED ORDER — IPRATROPIUM-ALBUTEROL 0.5-2.5 (3) MG/3ML IN SOLN
3.0000 mL | Freq: Four times a day (QID) | RESPIRATORY_TRACT | Status: DC
  Administered 2018-03-30 – 2018-04-03 (×16): 3 mL via RESPIRATORY_TRACT
  Filled 2018-03-29 (×16): qty 3

## 2018-03-29 MED ORDER — HYDRALAZINE HCL 20 MG/ML IJ SOLN: 10.0000 mg | Freq: Four times a day (QID) | INTRAMUSCULAR | Status: DC | PRN

## 2018-03-29 MED ORDER — SODIUM CHLORIDE 0.9 % IV SOLN
INTRAVENOUS | Status: DC
  Administered 2018-03-29: 20:00:00 via INTRAVENOUS
  Administered 2018-03-30: 1000 mL via INTRAVENOUS

## 2018-03-29 NOTE — ED Notes (Signed)
Pt given moist mouth swab for dry mouth per Dr Thurnell Garbe okay

## 2018-03-29 NOTE — ED Triage Notes (Signed)
Pt states that she had a tracheal blockage on Monday at [facility] she is having sob coughing up stuff and fever since then.

## 2018-03-29 NOTE — ED Provider Notes (Signed)
[facility] EMERGENCY DEPARTMENT Provider Note   CSN: [id number] Arrival date & time: 03/29/18  1616     History   Chief Complaint Chief Complaint  Patient presents with  . Shortness of Breath    HPI Caitlyn Anderson is a 57 y.o. female with a history of DM, asthma, allergic rhinitis, GERD, HTN and is 6 days post op from bronchoscopy and ablation of a tracheal web by Dr. Roxan Hockey with persistent and worsening shortness of breath along with low grade fever to 99.5 and cough productive of yellow sputum production.  She denies any bloody discharge, denies chest pain, n/v abdominal pain, palpitations.  She last used her albuterol mdi this am without relief.  The history is provided by the patient and the spouse.    Past Medical History:  Diagnosis Date  . Allergic rhinitis   . Asthma   . 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 Dyspnea   . Family history of adverse reaction to anesthesia   . GERD (gastroesophageal reflux disease)   . Hyperlipidemia   . Hypertension   . Hypothyroidism   . Left bundle branch block     Patient Active Problem List   Diagnosis Date Noted  . Tracheal stricture   . Dyspnea 01/28/2018  . Wheezing 12/16/2017    Past Surgical History:  Procedure Laterality Date  . BREAST SURGERY Left    lumpectomy  . CESAREAN SECTION    . COLONOSCOPY    . DILATION AND CURETTAGE OF UTERUS    . FLEXIBLE BRONCHOSCOPY N/A 03/24/2018   Procedure: FLEXIBLE BRONCHOSCOPY;  Surgeon: Melrose Nakayama, MD;  Location: [place];  Service: Thoracic;  Laterality: N/A;  . LASER BRONCHOSCOPY N/A 03/24/2018   Procedure: LASER BRONCHOSCOPY FOR RESECTION OF TRACHEAL WEB;  Surgeon: Melrose Nakayama, MD;  Location: [facility] OR;  Service: Thoracic;  Laterality: N/A;  . TONSILLECTOMY    . VIDEO BRONCHOSCOPY Bilateral 02/03/2018   Procedure: VIDEO BRONCHOSCOPY WITHOUT FLUORO;  Surgeon: Collene Gobble, MD;  Location: [facility] ENDOSCOPY;  Service: Cardiopulmonary;   Laterality: Bilateral;     OB History   None      Home Medications    Prior to Admission medications   Medication Sig Start Date End Date Taking? Authorizing Provider  acetaminophen (TYLENOL) 500 MG tablet Take 1,000 mg by mouth every 6 (six) hours as needed for mild pain.   Yes [provider]  albuterol (PROAIR HFA) 108 (90 Base) MCG/ACT inhaler Inhale 2 puffs into the lungs 4 (four) times daily as needed for wheezing or shortness of breath.    Yes [provider]  ALPRAZolam (XANAX) 0.25 MG tablet Take 0.25 mg by mouth daily as needed for anxiety.   Yes [provider]  aspirin 81 MG tablet Take 81 mg by mouth daily.    Yes [provider]  atorvastatin (LIPITOR) 20 MG tablet Take 20 tablets by mouth daily.    Yes [provider]  cetirizine (ZYRTEC) 10 MG tablet Take 10 mg by mouth at bedtime.    Yes [provider]  Cholecalciferol 2000 units TABS Take 2,000 Units by mouth daily.    Yes [provider]  escitalopram (LEXAPRO) 10 MG tablet Take 10 mg by mouth daily.    Yes [provider]  esomeprazole (NEXIUM) 20 MG capsule Take 40 mg by mouth daily at 12 noon.   Yes [provider]  fluticasone (FLONASE) 50 MCG/ACT nasal spray Inhale 1  spray into the lungs daily.   Yes [provider]  Hypromellose 0.3 % SOLN Place 1 application into both eyes at bedtime.   Yes [provider]  levothyroxine (SYNTHROID, LEVOTHROID) 75 MCG tablet Take 75 mcg by mouth daily before breakfast.   Yes [provider]  metFORMIN (GLUCOPHAGE) 1000 MG tablet Take 1,000 mg by mouth daily.    Yes [provider]  valsartan (DIOVAN) 160 MG tablet Take 1 tablet (160 mg total) by mouth daily. 12/16/17  Yes Collene Gobble, MD    Family History No family history on file.  Social History Social History   Tobacco Use  . Smoking status: Never Smoker  . Smokeless tobacco: Never Used  Substance  Use Topics  . Alcohol use: Not Currently  . Drug use: Not Currently     Allergies   Penicillins   Review of Systems Review of Systems  Constitutional: Positive for chills and fever.  HENT: Positive for congestion and rhinorrhea. Negative for ear pain, sinus pressure, sore throat, trouble swallowing and voice change.   Eyes: Negative for discharge.  Respiratory: Positive for cough, chest tightness, shortness of breath and stridor. Negative for wheezing.   Cardiovascular: Negative for chest pain and palpitations.  Gastrointestinal: Negative for abdominal pain, nausea and vomiting.  Genitourinary: Negative.      Physical Exam Updated Vital Signs BP (!) 145/82   Pulse 89   Temp 97.9 F (36.6 C) (Oral)   Resp 15   Ht 5' (1.524 m)   Wt 89.4 kg   SpO2 97%   BMI 38.47 kg/m   Physical Exam  Constitutional: She appears well-developed and well-nourished.  HENT:  Head: Normocephalic and atraumatic.  Eyes: Conjunctivae are normal.  Neck: Normal range of motion.  Cardiovascular: Normal rate, regular rhythm, normal heart sounds and intact distal pulses.  Pulmonary/Chest: Effort normal. She has decreased breath sounds. She has no wheezes. She has no rhonchi. She has no rales.  Stridor appreciated.    Abdominal: Soft. Bowel sounds are normal. There is no tenderness.  Musculoskeletal: Normal range of motion.  Neurological: She is alert.  Skin: Skin is warm and dry.  Psychiatric: She has a normal mood and affect.  Nursing note and vitals reviewed.    ED Treatments / Results  Labs (all labs ordered are listed, but only abnormal results are displayed) Labs Reviewed  CBC WITH DIFFERENTIAL/PLATELET - Abnormal; Notable for the following components:      Result Value   WBC 13.0 (*)    Neutro Abs 9.6 (*)    All other components within normal limits  BASIC METABOLIC PANEL - Abnormal; Notable for the following components:   Glucose, Bld 143 (*)    All other components within  normal limits  TROPONIN I    EKG EKG Interpretation  Date/Time:  Saturday March 29 2018 17:54:43 EDT Ventricular Rate:  88 PR Interval:    QRS Duration: 141 QT Interval:  400 QTC Calculation: 484 R Axis:   30 Text Interpretation:  Sinus rhythm IVCD, consider atypical LBBB No old tracing to compare Confirmed by Francine Graven [phone]) on 03/29/2018 6:09:12 PM   Radiology Dg Chest 2 View  Result Date: 03/29/2018 CLINICAL DATA:  Cough and shortness of breath. Fever. EXAM: CHEST - 2 VIEW COMPARISON:  03/24/2018. FINDINGS: Normal sized heart. Clear lungs. Thoracic spine degenerative changes. IMPRESSION: No acute abnormality. Electronically Signed   By: Claudie Revering M.D.   On: 03/29/2018 18:27   Ct Soft Tissue  Neck W Contrast  Result Date: 03/29/2018 CLINICAL DATA:  Shortness of breath, productive cough, fever, sore throat, and stridor for 5 days, recent surgery for tracheal blockage. EXAM: CT NECK WITH CONTRAST TECHNIQUE: Multidetector CT imaging of the neck was performed using the standard protocol following the bolus administration of intravenous contrast. CONTRAST:  190m ISOVUE-300 IOPAMIDOL (ISOVUE-300) INJECTION 61% COMPARISON:  CT neck 02/19/2018. FINDINGS: Pharynx and larynx: Since the previous study, the patient has undergone laser resection of a tracheal web. 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 This is likely due to postoperative edema, as there is no focal hematoma. Using electronic measurements, the region of maximal narrowing corresponds to an area of 0.2 cm squared which appears significantly worse than the preoperative glottic cross-section, and is also significantly more narrowed than the glottic area of 1.5 cm squared more inferiorly. Salivary glands: No inflammation, mass, or stone. Thyroid: Normal. Lymph nodes: None enlarged or abnormal density. Vascular: Atheromatous change RIGHT carotid bifurcation Limited intracranial: Negative Visualized orbits:  Negative Mastoids and visualized paranasal sinuses: No significant fluid accumulation Skeleton: Spondylosis, described previously. 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 This correlates with the clinical findings of severe stridor. These results were called by telephone at the time of interpretation on 03/29/2018 at 7:57 pm to [facility] these results. Electronically Signed   By: JStaci RighterM.D.   On: 03/29/2018 19:58   Ct Chest W Contrast  Result Date: 03/29/2018 CLINICAL DATA:  Productive cough and fever.  Shortness of breath. EXAM: CT CHEST WITH CONTRAST TECHNIQUE: Multidetector CT imaging of the chest was performed during intravenous contrast administration. CONTRAST:  1062mISOVUE-300 IOPAMIDOL (ISOVUE-300) INJECTION 61% COMPARISON:  No comparison studies available. FINDINGS: Cardiovascular: The heart size is normal. No substantial pericardial effusion. Mediastinum/Nodes: No mediastinal lymphadenopathy. There is no hilar lymphadenopathy. The esophagus has normal imaging features. There is no axillary lymphadenopathy. Lungs/Pleura: The central tracheobronchial airways are patent. Subglottic trachea appears narrowed at 4 mm (image 14/series 4). This is progressive comparing to the study from about a month ago. 4 mm right middle lobe pulmonary nodule is unchanged. Lungs otherwise normal. Upper Abdomen: The liver shows diffusely decreased attenuation suggesting steatosis. Musculoskeletal: No worrisome lytic or sclerotic osseous abnormality. IMPRESSION: 1. Subglottic tracheal stenosis appears more prominent today than on prior study. Patient underwent laser resection of tracheal web in this region on 03/24/2018. 2. 4 mm right middle lobe pulmonary nodule. No follow-up needed if patient  is low-risk. Non-contrast chest CT can be considered in 12 months if patient is high-risk. This recommendation follows the consensus statement: Guidelines for Management of Incidental Pulmonary Nodules Detected on CT Images: From the Fleischner Society 2017; Radiology 2017; 284:228-243. Electronically Signed   By: ErMisty Stanley.D.   On: 03/29/2018 19:42    Procedures Procedures (including critical care time)  Medications Ordered in ED Medications  0.9 %  sodium chloride infusion ( Intravenous New Bag/Given 03/29/18 2010)  ipratropium-albuterol (DUONEB) 0.5-2.5 (3) MG/3ML nebulizer solution 3 mL (3 mLs Nebulization Given 03/29/18 1704)  methylPREDNISolone sodium succinate (SOLU-MEDROL) 125 mg/2 mL injection 125 mg (125 mg Intravenous Given 03/29/18 1840)  iopamidol (ISOVUE-300) 61 % injection 100 mL (100 mLs Intravenous Contrast Given 03/29/18 1848)     Initial Impression / Assessment and Plan / ED Course  I have reviewed the triage vital  signs and the nursing notes.  Pertinent labs & imaging results that were available during my care of the patient were reviewed by me and considered in my medical decision making (see chart for details).     Imaging reviewed and discussed with pt. Pt to be transferred to ICU at [facility], per conversation by Dr. Thurnell Garbe with Dr. Roxy Horseman. Refer to Dr. Thurnell Garbe' note.  Final Clinical Impressions(s) / ED Diagnoses   Final diagnoses:  Chronic stridor  Tracheal stenosis    ED Discharge Orders    None       Landis Martins 03/29/18 2034    Francine Graven, DO 04/02/18 1542

## 2018-03-29 NOTE — ED Provider Notes (Signed)
Medical screening examination/treatment/procedure(s) were conducted as a shared visit with non-physician practitioner(s) and myself.  I personally evaluated the patient during the encounter.   Patient Vitals for the past 24 hrs:  BP Temp Temp src Pulse Resp SpO2 Height Weight  03/29/18 2005 (!) 145/82 - - 89 15 97 % - -  03/29/18 1845 - - - 91 16 98 % - -  03/29/18 1815 - - - 84 16 97 % - -  03/29/18 1800 (!) 108/93 - - 92 12 97 % - -  03/29/18 1734 (!) 144/86 - - 79 15 96 % - -  03/29/18 1730 (!) 144/84 - - 84 - 99 % - -  03/29/18 1654 - - - 85 - 97 % - -  03/29/18 1621 (!) 158/86 97.9 F (36.6 C) Oral 100 16 97 % 5' (1.524 m) 89.4 kg   57yo F, c/o increasing SOB, cough and subjective home fevers for the past 4 to 5 days. Pt has hx tracheal stenosis, s/p flexible fiberoptic bronchoscopy with laser resection of tracheal web on 03/24/18 (Dr. Roxan Hockey). Pt states she "noisy breathing" as been her baseline. Afebrile in ED with stable O2 Sats 96-99% R/A. NAD, AAO, resps easy, speaking full sentences with ease, lungs CTA bilat, +stridor, neuro non-focal. IV solumedrol and short neb given. Pt states she feels somewhat better now. Stridor now insp only. CT as below.    2005:  T/C returned from [place] Dr. Servando Snare, case discussed, including:  HPI, pertinent PM/SHx, VS/PE, dx testing, ED course and treatment:  Agreeable to consult, requests to admit/transfer to [facility] ICU to [facility] service. Dx and testing d/w pt and family.  Questions answered.  Verb understanding, agreeable to admit/transfer to [facility].  2050:  T/C returned from [place] Dr. Oletta Darter, case discussed, including:  HPI, pertinent PM/SHx, VS/PE, dx testing, ED course and treatment:  Agreeable to admit, requests to write temporary orders, obtain ICU bed to Dr. Michelle Piper service. [facility] aware.    MDM Reviewed: previous chart, nursing note and vitals Reviewed previous: labs, ECG and CT scan Interpretation: labs, ECG, x-ray and CT  scan Consults: Cardiothoracic Surgery, PCCM.    EKG Interpretation  Date/Time:  Saturday March 29 2018 17:54:43 EDT Ventricular Rate:  88 PR Interval:    QRS Duration: 141 QT Interval:  400 QTC Calculation: 484 R Axis:   30 Text Interpretation:  Sinus rhythm IVCD, consider atypical LBBB No old tracing to compare Confirmed by Francine Graven [phone]) on 03/29/2018 6:09:12 PM   Results for orders placed or performed during the hospital encounter of 03/29/18  CBC with Differential  Result Value Ref Range   WBC 13.0 (H) 4.0 - 10.5 K/uL   RBC 4.28 3.87 - 5.11 MIL/uL   Hemoglobin 14.0 12.0 - 15.0 g/dL   HCT 41.0 36.0 - 46.0 %   MCV 95.8 78.0 - 100.0 fL   MCH 32.7 26.0 - 34.0 pg   MCHC 34.1 30.0 - 36.0 g/dL   RDW 13.0 11.5 - 15.5 %   Platelets 265 150 - 400 K/uL   Neutrophils Relative % 74 %   Neutro Abs 9.6 (H) 1.7 - 7.7 K/uL   Lymphocytes Relative 17 %   Lymphs Abs 2.2 0.7 - 4.0 K/uL   Monocytes Relative 7 %   Monocytes Absolute 0.9 0.1 - 1.0 K/uL   Eosinophils Relative 2 %   Eosinophils Absolute 0.3 0.0 - 0.7 K/uL   Basophils Relative 0 %   Basophils Absolute 0.0 0.0 -  0.1 K/uL  Basic metabolic panel  Result Value Ref Range   Sodium 138 135 - 145 mmol/L   Potassium 3.7 3.5 - 5.1 mmol/L   Chloride 101 98 - 111 mmol/L   CO2 28 22 - 32 mmol/L   Glucose, Bld 143 (H) 70 - 99 mg/dL   BUN 10 6 - 20 mg/dL   Creatinine, Ser 0.75 0.44 - 1.00 mg/dL   Calcium 9.2 8.9 - 10.3 mg/dL   GFR calc non Af Amer >60 >60 mL/min   GFR calc Af Amer >60 >60 mL/min   Anion gap 9 5 - 15  Troponin I  Result Value Ref Range   Troponin I <0.03 <0.03 ng/mL   Dg Chest 2 View Result Date: 03/29/2018 CLINICAL DATA:  Cough and shortness of breath. Fever. EXAM: CHEST - 2 VIEW COMPARISON:  03/24/2018. FINDINGS: Normal sized heart. Clear lungs. Thoracic spine degenerative changes. IMPRESSION: No acute abnormality. Electronically Signed   By: Claudie Revering M.D.   On: 03/29/2018 18:27   Ct Soft  Tissue Neck W Contrast Result Date: 03/29/2018 CLINICAL DATA:  Shortness of breath, productive cough, fever, sore throat, and stridor for 5 days, recent surgery for tracheal blockage. EXAM: CT NECK WITH CONTRAST TECHNIQUE: Multidetector CT imaging of the neck was performed using the standard protocol following the bolus administration of intravenous contrast. CONTRAST:  186mL ISOVUE-300 IOPAMIDOL (ISOVUE-300) INJECTION 61% COMPARISON:  CT neck 02/19/2018. FINDINGS: Pharynx and larynx: Since the previous study, the patient has undergone laser resection of a tracheal web. 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 This is likely due to postoperative edema, as there is no focal hematoma. Using electronic measurements, the region of maximal narrowing corresponds to an area of 0.2 cm squared which appears significantly worse than the preoperative glottic cross-section, and is also significantly more narrowed than the glottic area of 1.5 cm squared more inferiorly. Salivary glands: No inflammation, mass, or stone. Thyroid: Normal. Lymph nodes: None enlarged or abnormal density. Vascular: Atheromatous change RIGHT carotid bifurcation Limited intracranial: Negative Visualized orbits: Negative Mastoids and visualized paranasal sinuses: No significant fluid accumulation Skeleton: Spondylosis, described previously. 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 This correlates with the clinical findings of severe stridor. These results were called by telephone at the time of interpretation on 03/29/2018 at 7:57 pm to Dr. [facility] who verbally acknowledged these results. Electronically Signed   By: Staci Righter M.D.   On: 03/29/2018 19:58   Ct Chest W Contrast Result Date: 03/29/2018 CLINICAL DATA:  Productive cough and  fever.  Shortness of breath. EXAM: CT CHEST WITH CONTRAST TECHNIQUE: Multidetector CT imaging of the chest was performed during intravenous contrast administration. CONTRAST:  139mL ISOVUE-300 IOPAMIDOL (ISOVUE-300) INJECTION 61% COMPARISON:  No comparison studies available. FINDINGS: Cardiovascular: The heart size is normal. No substantial pericardial effusion. Mediastinum/Nodes: No mediastinal lymphadenopathy. There is no hilar lymphadenopathy. The esophagus has normal imaging features. There is no axillary lymphadenopathy. Lungs/Pleura: The central tracheobronchial airways are patent. Subglottic trachea appears narrowed at 4 mm (image 14/series 4). This is progressive comparing to the study from about a month ago. 4 mm right middle lobe pulmonary nodule is unchanged. Lungs otherwise normal. Upper Abdomen: The liver shows diffusely decreased attenuation suggesting steatosis. Musculoskeletal: No worrisome lytic or sclerotic osseous abnormality. IMPRESSION: 1. Subglottic tracheal stenosis appears more prominent today than on  prior study. Patient underwent laser resection of tracheal web in this region on 03/24/2018. 2. 4 mm right middle lobe pulmonary nodule. No follow-up needed if patient is low-risk. Non-contrast chest CT can be considered in 12 months if patient is high-risk. This recommendation follows the consensus statement: Guidelines for Management of Incidental Pulmonary Nodules Detected on CT Images: From the Fleischner Society 2017; Radiology 2017; 284:228-243. Electronically Signed   By: Misty Stanley M.D.   On: 03/29/2018 19:42      Francine Graven, DO 04/02/18 1541

## 2018-03-29 NOTE — H&P (Signed)
NAME:  Caitlyn Anderson, MRN:  [id number], DOB:  December 23, 1960, LOS: 0 ADMISSION DATE:  03/29/2018, CONSULTATION DATE:  9/21 REFERRING MD:  Dr. Thurnell Garbe , CHIEF COMPLAINT:  Stridor    History Present Illness   57 year old female with PMH of DM, Asthma, Allergic Rhinitis, GERD, HTN, Tracheal Stenosis   Presents to [facility] ED on 9/21 shortness of breath, productive cough, and low grade fever for last 4-5 days. 9/16 patient underwent ablation of tracheal web by Dr. Roxan Hockey. Upon arrival to ED patient noted to have Expiratory Stridor. CT Chest with subglottic tracheal stenosis in which is more prominent then in previous study. Dr. Servando Snare consulted, patient to be admitted under PCCM service to [facility] ICU.   Significant Hospital Events   9/21 > Presented to [facility] ED > Transfer to [facility]   Consults: date of consult/date signed off & final recs:  CT Surgery 9/21 PCCM 9/21  Procedures (surgical and bedside):  9/16 > Ablation of Tracheal Web  Significant Diagnostic Tests:  CXR 9/21 > No acute  CT Neck 9/21 > Circumferential subglottic stenosis at the site of laser resection of a tracheal web, with an approximate area of 0.2 cm squared, significantly worse than the preoperative appearance. This correlates with the clinical findings of severe stridor CT Chest 9/21 > Subglottic tracheal stenosis appears more prominent today than on prior study. Patient underwent laser resection of tracheal web in this region on 03/24/2018. 2. 4 mm right middle lobe pulmonary nodule. No follow-up needed if patient is low-risk. Non-contrast chest CT can be considered in 12 months if patient is high-risk.   Micro Data: Blood 9/21 >> Sputum 9/21 >>  U/A 9/21 >>  Antimicrobials:  N/A    Subjective:  Arrived from [facility] Speaking in Full sentences States no improvement in dyspnea   Objective   Blood pressure (!) 164/107, pulse 79, temperature 97.9 F (36.6 C), temperature source Oral, resp. rate 14,  height 5' (1.524 m), weight 89.4 kg, SpO2 97 %.       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  -Given Solu-Medrol, Albuterol  s/p Ablation of Tracheal web on 9/16 Plan  -CT Surgery Following  -Solu-Medrol 60 q6h -Maintain Oxygen Saturation >92 -Scheduled Duoneb, PRN Racepi   -Maintain NPO -Monitor Airway, may need intubation   H/O HTN Plan  -Cardiac Monitoring -Maintain Systolic <683 -PRN Hydralazine   H/O Asthma  Plan  -PRN Albuterol   Leucocytosis, Likely reactive   Plan  -Trend WBC and Fever Curve -PAN Culture  -PCT and LA pending   DM Plan  -Trend glucose -SSI  -Hold Metformin   GERD  Plan  -Pepcid   Anxiety  Plan  -PRN Ativan   Disposition / Summary of Today's Plan 03/29/18   57 year old female s/p Tracheal Web Ablation presents with dyspnea and stridor. CT surgery to consult in AM. Admitted to ICU for airway monitoring     Diet: NPO DVT prophylaxis: Heparin SQ GI prophylaxis: Pepcid  Hyperglycemia protocol Mobility:Bedrest  Code Status: FC Family Communication: No  family at bedside   Labs   CBC: Recent Labs  Lab 03/24/18 0941 03/29/18 1706  WBC 8.5 13.0*  NEUTROABS  --  9.6*  HGB 15.5* 14.0  HCT 46.0 41.0  MCV 95.0 95.8  PLT 260 937   Basic Metabolic Panel: Recent Labs  Lab 03/24/18 0941 03/29/18 1706  NA 140 138  K 4.0 3.7  CL 106 101  CO2 23 28  GLUCOSE 149* 143*  BUN 11 10  CREATININE 0.86 0.75  CALCIUM 9.6 9.2   GFR: Estimated Creatinine Clearance: 75.4 mL/min (by C-G formula based on SCr of 0.75 mg/dL). Recent Labs  Lab  03/24/18 0941 03/29/18 1706  WBC 8.5 13.0*   Liver Function Tests: Recent Labs  Lab 03/24/18 0941  AST 25  ALT 24  ALKPHOS 66  BILITOT 0.7  PROT 7.2  ALBUMIN 3.9   No results for input(s): LIPASE, AMYLASE in the last 168 hours. No results for input(s): AMMONIA in the last 168 hours. ABG No results found for: PHART, PCO2ART, PO2ART, HCO3, TCO2, ACIDBASEDEF, O2SAT  Coagulation Profile: Recent Labs  Lab 03/24/18 0941  INR 1.00   Cardiac Enzymes: Recent Labs  Lab 03/29/18 1845  TROPONINI <0.03   HbA1C: No results found for: HGBA1C CBG: Recent Labs  Lab 03/24/18 0949 03/24/18 1215  GLUCAP 129* 140*    Admitting History of Present Illness.   As Above  Review of Systems:   All negative; except for those that are bolded, which indicate positives.  Constitutional: weight loss, weight gain, night sweats, fevers, chills, fatigue, weakness.  HEENT: headaches, sore throat, sneezing, nasal congestion, post nasal drip, difficulty swallowing, tooth/dental problems, visual complaints, visual changes, ear aches. Neuro: difficulty with speech, weakness, numbness, ataxia. CV:  chest pain, orthopnea, PND, swelling in lower extremities, dizziness, palpitations, syncope.  Resp: cough, hemoptysis, dyspnea, wheezing. GI: heartburn, indigestion, abdominal pain, nausea, vomiting, diarrhea, constipation, change in bowel habits, loss of appetite, hematemesis, melena, hematochezia.  GU: dysuria, change in color of urine, urgency or frequency, flank pain, hematuria. MSK: joint pain or swelling, decreased range of motion. Psych: change in mood or affect, depression, anxiety, suicidal ideations, homicidal ideations. Skin: rash, itching, bruising.  Past medical history  She,  has a past medical history of Allergic rhinitis, Asthma, Diabetes mellitus without complication ([place]), Dyspnea, Family history of adverse reaction to anesthesia, GERD (gastroesophageal reflux disease), Hyperlipidemia,  Hypertension, Hypothyroidism, and Left bundle branch block.   Surgical History    Past Surgical History:  Procedure Laterality Date  . BREAST SURGERY Left    lumpectomy  . CESAREAN SECTION    . COLONOSCOPY    . DILATION AND CURETTAGE OF UTERUS    . FLEXIBLE BRONCHOSCOPY N/A 03/24/2018   Procedure: FLEXIBLE BRONCHOSCOPY;  Surgeon: Melrose Nakayama, MD;  Location: [facility];  Service: Thoracic;  Laterality: N/A;  . LASER BRONCHOSCOPY N/A 03/24/2018   Procedure: LASER BRONCHOSCOPY FOR RESECTION OF TRACHEAL WEB;  Surgeon: Melrose Nakayama, MD;  Location: [facility] OR;  Service: Thoracic;  Laterality: N/A;  . TONSILLECTOMY    . VIDEO BRONCHOSCOPY Bilateral 02/03/2018   Procedure: VIDEO BRONCHOSCOPY WITHOUT FLUORO;  Surgeon: Collene Gobble, MD;  Location: [facility] ENDOSCOPY;  Service: Cardiopulmonary;  Laterality: Bilateral;     Social History   Social History   Socioeconomic History  . Marital status: Married    Spouse name: Not on file  . Number of children: Not on file  . Years of education: Not on file  . Highest education level:  Not on file  Occupational History  . Not on file  Social Needs  . Financial resource strain: Not on file  . Food insecurity:    Worry: Not on file    Inability: Not on file  . Transportation needs:    Medical: Not on file    Non-medical: Not on file  Tobacco Use  . Smoking status: Never Smoker  . Smokeless tobacco: Never Used  Substance and Sexual Activity  . Alcohol use: Not Currently  . Drug use: Not Currently  . Sexual activity: Not on file  Lifestyle  . Physical activity:    Days per week: Not on file    Minutes per session: Not on file  . Stress: Not on file  Relationships  . Social connections:    Talks on phone: Not on file    Gets together: Not on file    Attends religious service: Not on file    Active member of club or organization: Not on file    Attends meetings of clubs or organizations: Not on file    Relationship status: Not on  file  . Intimate partner violence:    Fear of current or ex partner: Not on file    Emotionally abused: Not on file    Physically abused: Not on file    Forced sexual activity: Not on file  Other Topics Concern  . Not on file  Social History Narrative  . Not on file  ,  reports that she has never smoked. She has never used smokeless tobacco. She reports that she drank alcohol. She reports that she has current or past drug history.   Family history   Her family history is not on file.   Allergies Allergies  Allergen Reactions  . Penicillins Hives and Rash    Has patient had a PCN reaction causing immediate rash, facial/tongue/throat swelling, SOB or lightheadedness with hypotension: No Has patient had a PCN reaction causing severe rash involving mucus membranes or skin necrosis: No Has patient had a PCN reaction that required hospitalization: No Has patient had a PCN reaction occurring within the last 10 years: No If all of the above answers are "NO", then may proceed with Cephalosporin use.     Home meds  Prior to Admission medications   Medication Sig Start Date End Date Taking? Authorizing Provider  acetaminophen (TYLENOL) 500 MG tablet Take 1,000 mg by mouth every 6 (six) hours as needed for mild pain.   Yes [provider]  albuterol (PROAIR HFA) 108 (90 Base) MCG/ACT inhaler Inhale 2 puffs into the lungs 4 (four) times daily as needed for wheezing or shortness of breath.    Yes [provider]  ALPRAZolam (XANAX) 0.25 MG tablet Take 0.25 mg by mouth daily as needed for anxiety.   Yes [provider]  aspirin 81 MG tablet Take 81 mg by mouth daily.    Yes [provider]  atorvastatin (LIPITOR) 20 MG tablet Take 20 tablets by mouth daily.    Yes [provider]  cetirizine (ZYRTEC) 10 MG tablet Take 10 mg by mouth at bedtime.    Yes [provider]  Cholecalciferol 2000 units TABS Take 2,000 Units by mouth daily.    Yes  [provider]  escitalopram (LEXAPRO) 10 MG tablet Take 10 mg by mouth daily.    Yes [provider]  esomeprazole (NEXIUM) 20 MG capsule Take 40 mg by mouth daily at 12 noon.   Yes [provider]  fluticasone (FLONASE) 50 MCG/ACT nasal spray Inhale 1 spray into the lungs daily.   Yes [provider]  Hypromellose 0.3 % SOLN Place 1 application into both eyes at bedtime.   Yes [provider]  levothyroxine (SYNTHROID, LEVOTHROID) 75 MCG tablet Take 75 mcg by mouth daily before breakfast.   Yes [provider]  metFORMIN (GLUCOPHAGE) 1000 MG tablet Take 1,000 mg by mouth daily.    Yes [provider]  valsartan (DIOVAN) 160 MG tablet Take 1 tablet (160 mg total) by mouth daily. 12/16/17  Yes Byrum, Rose Fillers, MD     Hayden Pedro, AGACNP-BC [facility]  Pgr: [phone]  PCCM Pgr: [phone]

## 2018-03-30 DIAGNOSIS — J386 Stenosis of larynx: Secondary | ICD-10-CM | POA: Diagnosis present

## 2018-03-30 DIAGNOSIS — R061 Stridor: Secondary | ICD-10-CM

## 2018-03-30 DIAGNOSIS — R509 Fever, unspecified: Secondary | ICD-10-CM

## 2018-03-30 DIAGNOSIS — J45909 Unspecified asthma, uncomplicated: Secondary | ICD-10-CM | POA: Diagnosis present

## 2018-03-30 DIAGNOSIS — T380X5A Adverse effect of glucocorticoids and synthetic analogues, initial encounter: Secondary | ICD-10-CM | POA: Diagnosis present

## 2018-03-30 DIAGNOSIS — Z880 Allergy status to penicillin: Secondary | ICD-10-CM | POA: Diagnosis not present

## 2018-03-30 DIAGNOSIS — R5381 Other malaise: Secondary | ICD-10-CM | POA: Diagnosis not present

## 2018-03-30 DIAGNOSIS — F419 Anxiety disorder, unspecified: Secondary | ICD-10-CM | POA: Diagnosis present

## 2018-03-30 DIAGNOSIS — E039 Hypothyroidism, unspecified: Secondary | ICD-10-CM | POA: Diagnosis present

## 2018-03-30 DIAGNOSIS — Z23 Encounter for immunization: Secondary | ICD-10-CM | POA: Diagnosis not present

## 2018-03-30 DIAGNOSIS — E876 Hypokalemia: Secondary | ICD-10-CM | POA: Diagnosis present

## 2018-03-30 DIAGNOSIS — F411 Generalized anxiety disorder: Secondary | ICD-10-CM | POA: Diagnosis not present

## 2018-03-30 DIAGNOSIS — K219 Gastro-esophageal reflux disease without esophagitis: Secondary | ICD-10-CM | POA: Diagnosis present

## 2018-03-30 DIAGNOSIS — Z7982 Long term (current) use of aspirin: Secondary | ICD-10-CM | POA: Diagnosis not present

## 2018-03-30 DIAGNOSIS — J9601 Acute respiratory failure with hypoxia: Secondary | ICD-10-CM | POA: Diagnosis not present

## 2018-03-30 DIAGNOSIS — E785 Hyperlipidemia, unspecified: Secondary | ICD-10-CM | POA: Diagnosis present

## 2018-03-30 DIAGNOSIS — Z7951 Long term (current) use of inhaled steroids: Secondary | ICD-10-CM | POA: Diagnosis not present

## 2018-03-30 DIAGNOSIS — J398 Other specified diseases of upper respiratory tract: Secondary | ICD-10-CM | POA: Diagnosis not present

## 2018-03-30 DIAGNOSIS — Z8249 Family history of ischemic heart disease and other diseases of the circulatory system: Secondary | ICD-10-CM | POA: Diagnosis not present

## 2018-03-30 DIAGNOSIS — I10 Essential (primary) hypertension: Secondary | ICD-10-CM | POA: Diagnosis present

## 2018-03-30 DIAGNOSIS — Z79890 Hormone replacement therapy (postmenopausal): Secondary | ICD-10-CM | POA: Diagnosis not present

## 2018-03-30 DIAGNOSIS — I447 Left bundle-branch block, unspecified: Secondary | ICD-10-CM | POA: Diagnosis present

## 2018-03-30 DIAGNOSIS — E1165 Type 2 diabetes mellitus with hyperglycemia: Secondary | ICD-10-CM | POA: Diagnosis present

## 2018-03-30 DIAGNOSIS — D72829 Elevated white blood cell count, unspecified: Secondary | ICD-10-CM | POA: Diagnosis present

## 2018-03-30 DIAGNOSIS — M1612 Unilateral primary osteoarthritis, left hip: Secondary | ICD-10-CM | POA: Diagnosis present

## 2018-03-30 DIAGNOSIS — Z79899 Other long term (current) drug therapy: Secondary | ICD-10-CM | POA: Diagnosis not present

## 2018-03-30 LAB — BASIC METABOLIC PANEL
Anion gap: 10 (ref 5–15)
BUN: 12 mg/dL (ref 6–20)
CALCIUM: 9.2 mg/dL (ref 8.9–10.3)
CO2: 25 mmol/L (ref 22–32)
CREATININE: 0.82 mg/dL (ref 0.44–1.00)
Chloride: 102 mmol/L (ref 98–111)
GFR calc non Af Amer: >60 mL/min (ref >60)
Glucose, Bld: 198 mg/dL — ABNORMAL HIGH (ref 70–99)
Potassium: 4.8 mmol/L (ref 3.5–5.1)
SODIUM: 137 mmol/L (ref 135–145)

## 2018-03-30 LAB — URINALYSIS, ROUTINE W REFLEX MICROSCOPIC
Bilirubin Urine: NEGATIVE
GLUCOSE, UA: NEGATIVE mg/dL
Hgb urine dipstick: NEGATIVE
KETONES UR: 5 mg/dL — AB
NITRITE: NEGATIVE
PROTEIN: NEGATIVE mg/dL
Specific Gravity, Urine: 1.034 — ABNORMAL HIGH (ref 1.005–1.030)
WAXY CASTS UA: 1
pH: 6 (ref 5.0–8.0)

## 2018-03-30 LAB — CBC
HCT: 43 % (ref 36.0–46.0)
Hemoglobin: 14.2 g/dL (ref 12.0–15.0)
MCH: 31.8 pg (ref 26.0–34.0)
MCHC: 33 g/dL (ref 30.0–36.0)
MCV: 96.4 fL (ref 78.0–100.0)
PLATELETS: 283 10*3/uL (ref 150–400)
RBC: 4.46 MIL/uL (ref 3.87–5.11)
RDW: 12.6 % (ref 11.5–15.5)
WBC: 12.8 10*3/uL — AB (ref 4.0–10.5)

## 2018-03-30 LAB — PHOSPHORUS: Phosphorus: 2.9 mg/dL (ref 2.5–4.6)

## 2018-03-30 LAB — EXPECTORATED SPUTUM ASSESSMENT W GRAM STAIN, RFLX TO RESP C

## 2018-03-30 LAB — GLUCOSE, CAPILLARY
GLUCOSE-CAPILLARY: 166 mg/dL — AB (ref 70–99)
GLUCOSE-CAPILLARY: 240 mg/dL — AB (ref 70–99)
GLUCOSE-CAPILLARY: 242 mg/dL — AB (ref 70–99)
Glucose-Capillary: 193 mg/dL — ABNORMAL HIGH (ref 70–99)
Glucose-Capillary: 200 mg/dL — ABNORMAL HIGH (ref 70–99)
Glucose-Capillary: 208 mg/dL — ABNORMAL HIGH (ref 70–99)
Glucose-Capillary: 209 mg/dL — ABNORMAL HIGH (ref 70–99)

## 2018-03-30 LAB — MAGNESIUM
MAGNESIUM: 2.1 mg/dL (ref 1.7–2.4)
Magnesium: 2 mg/dL (ref 1.7–2.4)

## 2018-03-30 LAB — MRSA PCR SCREENING: MRSA by PCR: POSITIVE — AB

## 2018-03-30 LAB — EXPECTORATED SPUTUM ASSESSMENT W REFEX TO RESP CULTURE

## 2018-03-30 LAB — LACTIC ACID, PLASMA: LACTIC ACID, VENOUS: 1.4 mmol/L (ref 0.5–1.9)

## 2018-03-30 LAB — PROCALCITONIN

## 2018-03-30 LAB — HIV ANTIBODY (ROUTINE TESTING W REFLEX): HIV Screen 4th Generation wRfx: NONREACTIVE

## 2018-03-30 MED ORDER — LEVOTHYROXINE SODIUM 75 MCG PO TABS
75.0000 ug | ORAL_TABLET | Freq: Every day | ORAL | Status: DC
  Administered 2018-03-30 – 2018-04-03 (×5): 75 ug via ORAL
  Filled 2018-03-30 (×5): qty 1

## 2018-03-30 MED ORDER — ATORVASTATIN CALCIUM 40 MG PO TABS
40.0000 mg | ORAL_TABLET | Freq: Every day | ORAL | Status: DC
  Administered 2018-03-31 – 2018-04-03 (×2): 40 mg via ORAL
  Filled 2018-03-30 (×5): qty 1

## 2018-03-30 MED ORDER — CHLORHEXIDINE GLUCONATE CLOTH 2 % EX PADS
6.0000 | MEDICATED_PAD | Freq: Every day | CUTANEOUS | Status: DC
  Administered 2018-03-30 – 2018-04-02 (×4): 6 via TOPICAL

## 2018-03-30 MED ORDER — FAMOTIDINE IN NACL 20-0.9 MG/50ML-% IV SOLN
20.0000 mg | Freq: Two times a day (BID) | INTRAVENOUS | Status: DC
  Administered 2018-03-30 – 2018-03-31 (×3): 20 mg via INTRAVENOUS
  Filled 2018-03-30 (×3): qty 50

## 2018-03-30 MED ORDER — ESCITALOPRAM OXALATE 10 MG PO TABS
10.0000 mg | ORAL_TABLET | Freq: Every day | ORAL | Status: DC
  Administered 2018-03-30 – 2018-04-03 (×5): 10 mg via ORAL
  Filled 2018-03-30 (×5): qty 1

## 2018-03-30 MED ORDER — MUPIROCIN 2 % EX OINT
1.0000 "application " | TOPICAL_OINTMENT | Freq: Two times a day (BID) | CUTANEOUS | Status: AC
  Administered 2018-03-30 – 2018-04-03 (×9): 1 via NASAL
  Filled 2018-03-30: qty 22

## 2018-03-30 MED ORDER — PANTOPRAZOLE SODIUM 40 MG PO TBEC
40.0000 mg | DELAYED_RELEASE_TABLET | Freq: Every day | ORAL | Status: DC
  Administered 2018-03-30 – 2018-04-03 (×5): 40 mg via ORAL
  Filled 2018-03-30 (×5): qty 1

## 2018-03-30 MED ORDER — ASPIRIN EC 81 MG PO TBEC
81.0000 mg | DELAYED_RELEASE_TABLET | Freq: Every day | ORAL | Status: DC
  Administered 2018-03-30 – 2018-04-03 (×5): 81 mg via ORAL
  Filled 2018-03-30 (×5): qty 1

## 2018-03-30 MED ORDER — PREDNISONE 20 MG PO TABS
50.0000 mg | ORAL_TABLET | Freq: Every day | ORAL | Status: AC
  Administered 2018-03-31 – 2018-04-02 (×3): 50 mg via ORAL
  Filled 2018-03-30 (×3): qty 1

## 2018-03-30 MED ORDER — ATORVASTATIN CALCIUM 80 MG PO TABS
400.0000 mg | ORAL_TABLET | Freq: Every day | ORAL | Status: DC
  Filled 2018-03-30: qty 5

## 2018-03-30 MED ORDER — IRBESARTAN 150 MG PO TABS
150.0000 mg | ORAL_TABLET | Freq: Every day | ORAL | Status: DC
  Administered 2018-03-30 – 2018-04-03 (×5): 150 mg via ORAL
  Filled 2018-03-30 (×5): qty 1

## 2018-03-30 MED ORDER — MUPIROCIN 2 % EX OINT
TOPICAL_OINTMENT | CUTANEOUS | Status: AC
  Filled 2018-03-30: qty 22

## 2018-03-30 MED ORDER — FLUTICASONE PROPIONATE 50 MCG/ACT NA SUSP
1.0000 | Freq: Every day | NASAL | Status: DC
  Administered 2018-03-30 – 2018-04-03 (×5): 1 via NASAL
  Filled 2018-03-30: qty 16

## 2018-03-30 MED ORDER — LEVOTHYROXINE SODIUM 75 MCG PO TABS: 37.5000 ug | ORAL_TABLET | Freq: Every day | ORAL | Status: DC

## 2018-03-30 NOTE — Progress Notes (Signed)
Patient ID: Caitlyn Anderson, female   DOB: 24-Nov-1960, 57 y.o.   MRN: [id number] [facility] DAILY ICU PROGRESS NOTE                   [address]            [place] [address]          [phone]       Total Length of Stay:  LOS: 0 days   Subjective: Patient followed by Dr Lamonte Sakai , had  Flexible fiberoptic bronchoscopy with laser resection of tracheal web on 9/16 by Dr Roxan Hockey. Yesterday called with low grade fever and cough sob. Wanted antibiotics called in but was told to come to ER. CT of neck down and started on iv steriods and bronchiodilators with improvement . Feels better this am, but does get sob with activity.  Objective: Vital signs in last 24 hours: Temp:  [97.9 F (36.6 C)-98.6 F (37 C)] 98.6 F (37 C) (09/22 1000) Pulse Rate:  [70-100] 78 (09/22 0807) Cardiac Rhythm: Normal sinus rhythm (09/22 0738) Resp:  [8-20] 12 (09/22 1000) BP: (92-164)/(63-107) 92/63 (09/22 1000) SpO2:  [96 %-100 %] 100 % (09/22 1000) Weight:  [86.3 kg-89.4 kg] 86.3 kg (09/22 0412)  Filed Weights   03/29/18 1621 03/30/18 0000 03/30/18 0412  Weight: 89.4 kg 86.3 kg 86.3 kg    Weight change:    Hemodynamic parameters for last 24 hours:    Intake/Output from previous day: 09/21 0701 - 09/22 0700 In: 234.3 [I.V.:234.3] Out: -   Intake/Output this shift: No intake/output data recorded.  Current Meds: Scheduled Meds: . Chlorhexidine Gluconate Cloth  6 each Topical Q0600  . heparin  5,000 Units Subcutaneous Q8H  . insulin aspart  0-15 Units Subcutaneous Q4H  . ipratropium-albuterol  3 mL Nebulization Q6H  . levothyroxine  37.5 mcg Intravenous Daily  . methylPREDNISolone (SOLU-MEDROL) injection  60 mg Intravenous Q6H  . mupirocin ointment  1 application Nasal BID   Continuous Infusions: . sodium chloride 75 mL/hr at 03/30/18 0700  . famotidine (PEPCID) IV 20 mg (03/30/18 1000)   PRN Meds:.albuterol, hydrALAZINE, LORazepam, Racepinephrine HCl  General appearance:  alert, appears stated age and no distress Neurologic: intact Heart: regular rate and rhythm, S1, S2 normal, no murmur, click, rub or gallop Lungs: clear to auscultation bilaterally and no stridor on exam  Abdomen: soft, non-tender; bowel sounds normal; no masses,  no organomegaly Extremities: extremities normal, atraumatic, no cyanosis or edema  Lab Results: CBC: Recent Labs    03/29/18 1706 03/30/18 0313  WBC 13.0* 12.8*  HGB 14.0 14.2  HCT 41.0 43.0  PLT 265 283   BMET:  Recent Labs    03/29/18 1706 03/30/18 0313  NA 138 137  K 3.7 4.8  CL 101 102  CO2 28 25  GLUCOSE 143* 198*  BUN 10 12  CREATININE 0.75 0.82  CALCIUM 9.2 9.2    CMET: Lab Results  Component Value Date   WBC 12.8 (H) 03/30/2018   HGB 14.2 03/30/2018   HCT 43.0 03/30/2018   PLT 283 03/30/2018   GLUCOSE 198 (H) 03/30/2018   ALT 24 03/24/2018   AST 25 03/24/2018   NA 137 03/30/2018   K 4.8 03/30/2018   CL 102 03/30/2018   CREATININE 0.82 03/30/2018   BUN 12 03/30/2018   CO2 25 03/30/2018   INR 1.00 03/24/2018      PT/INR: No results for input(s): LABPROT, INR in the last 72 hours.  Radiology: Dg Chest 2 View  Result Date: 03/29/2018 CLINICAL DATA:  Cough and shortness of breath. Fever. EXAM: CHEST - 2 VIEW COMPARISON:  03/24/2018. FINDINGS: Normal sized heart. Clear lungs. Thoracic spine degenerative changes. IMPRESSION: No acute abnormality. Electronically Signed   By: Claudie Revering M.D.   On: 03/29/2018 18:27   Ct Soft Tissue Neck W Contrast  Result Date: 03/29/2018 CLINICAL DATA:  Shortness of breath, productive cough, fever, sore throat, and stridor for 5 days, recent surgery for tracheal blockage. EXAM: CT NECK WITH CONTRAST TECHNIQUE: Multidetector CT imaging of the neck was performed using the standard protocol following the bolus administration of intravenous contrast. CONTRAST:  170mL ISOVUE-300 IOPAMIDOL (ISOVUE-300) INJECTION 61% COMPARISON:  CT neck 02/19/2018. FINDINGS: Pharynx and  larynx: Since the previous study, the patient has undergone laser resection of a tracheal web. 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 This is likely due to postoperative edema, as there is no focal hematoma. Using electronic measurements, the region of maximal narrowing corresponds to an area of 0.2 cm squared which appears significantly worse than the preoperative glottic cross-section, and is also significantly more narrowed than the glottic area of 1.5 cm squared more inferiorly. Salivary glands: No inflammation, mass, or stone. Thyroid: Normal. Lymph nodes: None enlarged or abnormal density. Vascular: Atheromatous change RIGHT carotid bifurcation Limited intracranial: Negative Visualized orbits: Negative Mastoids and visualized paranasal sinuses: No significant fluid accumulation Skeleton: Spondylosis, described previously. 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 This correlates with the clinical findings of severe stridor. These results were called by telephone at the time of interpretation on 03/29/2018 at 7:57 pm to Dr. [facility] who verbally acknowledged these results. Electronically Signed   By: Staci Righter M.D.   On: 03/29/2018 19:58   Ct Chest W Contrast  Result Date: 03/29/2018 CLINICAL DATA:  Productive cough and fever.  Shortness of breath. EXAM: CT CHEST WITH CONTRAST TECHNIQUE: Multidetector CT imaging of the chest was performed during intravenous contrast administration. CONTRAST:  126mL ISOVUE-300 IOPAMIDOL (ISOVUE-300) INJECTION 61% COMPARISON:  No comparison studies available. FINDINGS: Cardiovascular: The heart size is normal. No substantial pericardial effusion. Mediastinum/Nodes: No mediastinal lymphadenopathy. There is no hilar lymphadenopathy. The esophagus has normal  imaging features. There is no axillary lymphadenopathy. Lungs/Pleura: The central tracheobronchial airways are patent. Subglottic trachea appears narrowed at 4 mm (image 14/series 4). This is progressive comparing to the study from about a month ago. 4 mm right middle lobe pulmonary nodule is unchanged. Lungs otherwise normal. Upper Abdomen: The liver shows diffusely decreased attenuation suggesting steatosis. Musculoskeletal: No worrisome lytic or sclerotic osseous abnormality. IMPRESSION: 1. Subglottic tracheal stenosis appears more prominent today than on prior study. Patient underwent laser resection of tracheal web in this region on 03/24/2018. 2. 4 mm right middle lobe pulmonary nodule. No follow-up needed if patient is low-risk. Non-contrast chest CT can be considered in 12 months if patient is high-risk. This recommendation follows the consensus statement: Guidelines for Management of Incidental Pulmonary Nodules Detected on CT Images: From the Fleischner Society 2017; Radiology 2017; 284:228-243. Electronically Signed   By: Misty Stanley M.D.   On: 03/29/2018 19:42     Assessment/Plan: Continue current therapy,  as improving , observe for acute respiratory  Distress. Follow up with Dr Roxan Hockey tomorrow.    Grace Isaac 03/30/2018 10:57 AM

## 2018-03-30 NOTE — Progress Notes (Signed)
Critical Care Attending.  57 year old woman admitted for observation of worsening stridor following tracheal web resection.   Surgery was performed 9/16 and CT showed possible worsening.  Admitted for observation. Started on steroids.  Presently improving respiratory status. Has been ambulating in room. Breathing has improved from admission. Cough occasionally productive of green sputum.  On examination:  No distress, speaking in full sentences.  Chest clear, mild inspiratory stridor  Labs: mild leukocytosis, otherwise unremarkable  ASSESSMENT:  Improving respiratory status. Continue steroids orally.  Keep in ICU until seen by Dr Roxan Hockey in am.

## 2018-03-31 DIAGNOSIS — J9601 Acute respiratory failure with hypoxia: Secondary | ICD-10-CM

## 2018-03-31 DIAGNOSIS — I10 Essential (primary) hypertension: Secondary | ICD-10-CM

## 2018-03-31 DIAGNOSIS — E876 Hypokalemia: Secondary | ICD-10-CM

## 2018-03-31 LAB — GLUCOSE, CAPILLARY
GLUCOSE-CAPILLARY: 198 mg/dL — AB (ref 70–99)
GLUCOSE-CAPILLARY: 181 mg/dL — AB (ref 70–99)
Glucose-Capillary: 147 mg/dL — ABNORMAL HIGH (ref 70–99)
Glucose-Capillary: 113 mg/dL — ABNORMAL HIGH (ref 70–99)
Glucose-Capillary: 171 mg/dL — ABNORMAL HIGH (ref 70–99)

## 2018-03-31 MED ORDER — INFLUENZA VAC SPLIT QUAD 0.5 ML IM SUSY
0.5000 mL | PREFILLED_SYRINGE | INTRAMUSCULAR | Status: AC
  Administered 2018-04-02: 0.5 mL via INTRAMUSCULAR
  Filled 2018-03-31: qty 0.5

## 2018-03-31 MED ORDER — GUAIFENESIN ER 600 MG PO TB12
600.0000 mg | ORAL_TABLET | Freq: Two times a day (BID) | ORAL | Status: DC
  Administered 2018-03-31 – 2018-04-03 (×7): 600 mg via ORAL
  Filled 2018-03-31 (×7): qty 1

## 2018-03-31 MED ORDER — METFORMIN HCL 500 MG PO TABS
1000.0000 mg | ORAL_TABLET | Freq: Every day | ORAL | Status: DC
  Administered 2018-04-01 – 2018-04-03 (×3): 1000 mg via ORAL
  Filled 2018-03-31 (×6): qty 2

## 2018-03-31 NOTE — H&P (Signed)
NAME:  Caitlyn Anderson, MRN:  [id number], DOB:  August 18, 1960, LOS: 1 ADMISSION DATE:  03/29/2018, CONSULTATION DATE:  9/21 REFERRING MD:  Dr. Thurnell Garbe , CHIEF COMPLAINT:  Stridor    History Present Illness   57 year old female with PMH of DM, Asthma, Allergic Rhinitis, GERD, HTN, Tracheal Stenosis   Presents to [facility] ED on 9/21 shortness of breath, productive cough, and low grade fever for last 4-5 days. 9/16 patient underwent ablation of tracheal web by Dr. Roxan Hockey. Upon arrival to ED patient noted to have Expiratory Stridor. CT Chest with subglottic tracheal stenosis in which is more prominent then in previous study. Dr. Servando Snare consulted, patient to be admitted under PCCM service to [facility] ICU.   Significant Hospital Events   9/21 > Presented to [facility] ED > Transfer to [facility]   Consults: date of consult/date signed off & final recs:  CT Surgery 9/21 PCCM 9/21  Procedures (surgical and bedside):  9/16 > Ablation of Tracheal Web  Significant Diagnostic Tests:  CXR 9/21 > No acute  CT Neck 9/21 > Circumferential subglottic stenosis at the site of laser resection of a tracheal web, with an approximate area of 0.2 cm squared, significantly worse than the preoperative appearance. This correlates with the clinical findings of severe stridor CT Chest 9/21 > Subglottic tracheal stenosis appears more prominent today than on prior study. Patient underwent laser resection of tracheal web in this region on 03/24/2018. 2. 4 mm right middle lobe pulmonary nodule. No follow-up needed if patient is low-risk. Non-contrast chest CT can be considered in 12 months if patient is high-risk.   Micro Data: Blood 9/21 >> Sputum 9/21 >>  U/A 9/21 >>  Antimicrobials:  N/A    Subjective:  No events overnight, continues to feel SOB with activity and speech  Objective   Blood pressure 129/68, pulse 91, temperature (!) 96.9 F (36.1 C), temperature source Axillary, resp. rate 17, height 5'  (1.524 m), weight 89.2 kg, SpO2 99 %.        Intake/Output Summary (Last 24 hours) at 03/31/2018 1142 Last data filed at 03/31/2018 1100 Gross per 24 hour  Intake 2111.8 ml  Output 1650 ml  Net 461.8 ml   Filed Weights   03/30/18 0000 03/30/18 0412 03/31/18 0336  Weight: 86.3 kg 86.3 kg 89.2 kg    Examination: General: Adult female, sitting up in the chair, NAD HENT: [place]/AT, PERRL, EOM-I and MMM Lungs: Upper airway wheezing audible in all lung fields Cardiovascular: RRR, Nl S1/S2 and -M/R/G Abdomen: Soft, NT, ND and +BS Extremities: -edema and -tenderness Neuro: alert, oriented, follows commands  GU: intact   I reviewed CXR myself, no acute disease noted  Resolved Hospital Problem list    Assessment & Plan:   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  -Given Solu-Medrol, Albuterol  s/p Ablation of Tracheal web on 9/16 Plan  - CT Surgery Following, appreciate input - Prednisone 50 mg PO daily - Titrate O2 for sat of 88-92% - Scheduled Duoneb, PRN Racepi   - Heart healthy carb modified diet - Monitor for airway compromise  H/O HTN Plan  - Continue tele monitoring - Arbosartan 150 PO daily - PRN hydralazine  H/O Asthma  Plan  - PRN albuterol - Prednisone  Leucocytosis, Likely reactive   Plan  - Trend WBC and Fever Curve - PAN Culture  - Procal <0.1, no need for abx  DM Plan  - CBGs - SSI - Restart metformin  GERD  Plan  - Protonix  Anxiety  Plan  - PRN ativan  Disposition / Summary of Today's Plan 03/31/18   Transfer to SDU for close observation, CVTS to manage upper airway, continue steroids for now, no need for intubation at this time.    Diet: NPO DVT prophylaxis: Heparin SQ GI prophylaxis: Pepcid  Hyperglycemia protocol Mobility:Bedrest  Code Status: FC Family Communication: No family at bedside   Labs   CBC: Recent Labs  Lab 03/29/18 1706 03/30/18 0313  WBC 13.0* 12.8*  NEUTROABS 9.6*  --   HGB 14.0 14.2  HCT 41.0 43.0  MCV  95.8 96.4  PLT 265 169   Basic Metabolic Panel: Recent Labs  Lab 03/29/18 1706 03/29/18 2352 03/30/18 0313  NA 138  --  137  K 3.7  --  4.8  CL 101  --  102  CO2 28  --  25  GLUCOSE 143*  --  198*  BUN 10  --  12  CREATININE 0.75  --  0.82  CALCIUM 9.2  --  9.2  MG  --  2.0 2.1  PHOS  --   --  2.9   GFR: Estimated Creatinine Clearance: 73.5 mL/min (by C-G formula based on SCr of 0.82 mg/dL). Recent Labs  Lab 03/29/18 1706 03/29/18 2352 03/30/18 0313  PROCALCITON  --  <0.10  --   WBC 13.0*  --  12.8*  LATICACIDVEN  --  1.4  --    Liver Function Tests: No results for input(s): AST, ALT, ALKPHOS, BILITOT, PROT, ALBUMIN in the last 168 hours. No results for input(s): LIPASE, AMYLASE in the last 168 hours. No results for input(s): AMMONIA in the last 168 hours. ABG No results found for: PHART, PCO2ART, PO2ART, HCO3, TCO2, ACIDBASEDEF, O2SAT  Coagulation Profile: No results for input(s): INR, PROTIME in the last 168 hours. Cardiac Enzymes: Recent Labs  Lab 03/29/18 1845  TROPONINI <0.03   HbA1C: No results found for: HGBA1C CBG: Recent Labs  Lab 03/30/18 2037 03/30/18 2351 03/31/18 0330 03/31/18 0729 03/31/18 1114  GLUCAP 242* 209* 147* 113* 198*   Discussed with bedside RN and PCCM-NP.  Rush Farmer, M.D. [facility] Pulmonary/Critical Care Medicine. Pager: [phone]. After hours pager: [phone].

## 2018-03-31 NOTE — Progress Notes (Addendum)
TCTS DAILY ICU PROGRESS NOTE                   [address]            [place] [address]          [phone]       Total Length of Stay:  LOS: 1 day   Subjective: Patient states breathing improved since admission, but still not quite "normal".  Objective: Vital signs in last 24 hours: Temp:  [97.6 F (36.4 C)-98.6 F (37 C)] 97.8 F (36.6 C) (09/23 0731) Pulse Rate:  [66-102] 79 (09/23 0700) Cardiac Rhythm: Normal sinus rhythm (09/23 0400) Resp:  [10-17] 15 (09/23 0700) BP: (90-135)/(51-82) 125/69 (09/23 0700) SpO2:  [97 %-100 %] 100 % (09/23 0700) Weight:  [89.2 kg] 89.2 kg (09/23 0336)  Filed Weights   03/30/18 0000 03/30/18 0412 03/31/18 0336  Weight: 86.3 kg 86.3 kg 89.2 kg    Weight change: -0.158 kg       Intake/Output from previous day: 09/22 0701 - 09/23 0700 In: 1821.8 [P.O.:750; I.V.:871.8; IV Piggyback:200] Out: 1950 [VVOHY:0737]  Intake/Output this shift: No intake/output data recorded.  Current Meds: Scheduled Meds: . aspirin EC  81 mg Oral Daily  . atorvastatin  40 mg Oral q1800  . Chlorhexidine Gluconate Cloth  6 each Topical Q0600  . escitalopram  10 mg Oral Daily  . fluticasone  1 spray Each Nare Daily  . heparin  5,000 Units Subcutaneous Q8H  . insulin aspart  0-15 Units Subcutaneous Q4H  . ipratropium-albuterol  3 mL Nebulization Q6H  . irbesartan  150 mg Oral Daily  . levothyroxine  75 mcg Oral QAC breakfast  . mupirocin ointment  1 application Nasal BID  . pantoprazole  40 mg Oral Daily  . predniSONE  50 mg Oral Q breakfast   Continuous Infusions: . sodium chloride 10 mL/hr at 03/31/18 0000  . famotidine (PEPCID) IV Stopped (03/30/18 2259)   PRN Meds:.albuterol, hydrALAZINE, LORazepam, Racepinephrine HCl  General appearance: alert, cooperative and no distress Heart: RRR Lungs: clear to auscultation bilaterally Abdomen: Soft, obese, non tender, bowel sounds present Extremities: SCDs in place  Lab  Results: CBC: Recent Labs    03/29/18 1706 03/30/18 0313  WBC 13.0* 12.8*  HGB 14.0 14.2  HCT 41.0 43.0  PLT 265 283   BMET:  Recent Labs    03/29/18 1706 03/30/18 0313  NA 138 137  K 3.7 4.8  CL 101 102  CO2 28 25  GLUCOSE 143* 198*  BUN 10 12  CREATININE 0.75 0.82  CALCIUM 9.2 9.2    CMET: Lab Results  Component Value Date   WBC 12.8 (H) 03/30/2018   HGB 14.2 03/30/2018   HCT 43.0 03/30/2018   PLT 283 03/30/2018   GLUCOSE 198 (H) 03/30/2018   ALT 24 03/24/2018   AST 25 03/24/2018   NA 137 03/30/2018   K 4.8 03/30/2018   CL 102 03/30/2018   CREATININE 0.82 03/30/2018   BUN 12 03/30/2018   CO2 25 03/30/2018   INR 1.00 03/24/2018      PT/INR: No results for input(s): LABPROT, INR in the last 72 hours. Radiology: No results found.   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  1. CV-SR in the 70's. 2. Pulmonary-on 2 liters of oxygen via [place]. On Prednisone, Duo nebs. Has had intermittent stridor of late. No further fever. Likely does not need ICU and Dr. Roxan Hockey  to evaluate     Nani Skillern PA-C 03/31/2018 7:46 AM   Patient seen and examined, agree with above She has very faint stridor with auscultation of neck. Symptoms improved from admission CT showed narrowing at site of laser ablation. Would not bronch unless symptoms worsen significantly as instrumentation may worsen edema Continue prednisone, PRN racemic epi Will add mucinex to help with clearing secretions  Remo Lipps C. Roxan Hockey, MD [facility] [phone]

## 2018-04-01 ENCOUNTER — Encounter (HOSPITAL_COMMUNITY): Payer: Self-pay | Admitting: Physical Medicine and Rehabilitation

## 2018-04-01 DIAGNOSIS — M1612 Unilateral primary osteoarthritis, left hip: Secondary | ICD-10-CM

## 2018-04-01 DIAGNOSIS — R5381 Other malaise: Secondary | ICD-10-CM

## 2018-04-01 LAB — PHOSPHORUS: Phosphorus: 3.6 mg/dL (ref 2.5–4.6)

## 2018-04-01 LAB — CBC
HCT: 38.7 % (ref 36.0–46.0)
Hemoglobin: 12.3 g/dL (ref 12.0–15.0)
MCH: 31.7 pg (ref 26.0–34.0)
MCHC: 31.8 g/dL (ref 30.0–36.0)
MCV: 99.7 fL (ref 78.0–100.0)
Platelets: 274 10*3/uL (ref 150–400)
RBC: 3.88 MIL/uL (ref 3.87–5.11)
RDW: 12.9 % (ref 11.5–15.5)
WBC: 12.3 10*3/uL — ABNORMAL HIGH (ref 4.0–10.5)

## 2018-04-01 LAB — MAGNESIUM: MAGNESIUM: 2.2 mg/dL (ref 1.7–2.4)

## 2018-04-01 LAB — BASIC METABOLIC PANEL
Anion gap: 7 (ref 5–15)
BUN: 19 mg/dL (ref 6–20)
CALCIUM: 8.8 mg/dL — AB (ref 8.9–10.3)
CHLORIDE: 102 mmol/L (ref 98–111)
CO2: 30 mmol/L (ref 22–32)
CREATININE: 0.98 mg/dL (ref 0.44–1.00)
GFR calc non Af Amer: >60 mL/min (ref >60)
Glucose, Bld: 135 mg/dL — ABNORMAL HIGH (ref 70–99)
Potassium: 3.4 mmol/L — ABNORMAL LOW (ref 3.5–5.1)
SODIUM: 139 mmol/L (ref 135–145)

## 2018-04-01 LAB — GLUCOSE, CAPILLARY
GLUCOSE-CAPILLARY: 146 mg/dL — AB (ref 70–99)
GLUCOSE-CAPILLARY: 165 mg/dL — AB (ref 70–99)
GLUCOSE-CAPILLARY: 204 mg/dL — AB (ref 70–99)
GLUCOSE-CAPILLARY: 237 mg/dL — AB (ref 70–99)
Glucose-Capillary: 116 mg/dL — ABNORMAL HIGH (ref 70–99)
Glucose-Capillary: 120 mg/dL — ABNORMAL HIGH (ref 70–99)
Glucose-Capillary: 213 mg/dL — ABNORMAL HIGH (ref 70–99)

## 2018-04-01 MED ORDER — INSULIN ASPART 100 UNIT/ML ~~LOC~~ SOLN
0.0000 [IU] | Freq: Every day | SUBCUTANEOUS | Status: DC
  Administered 2018-04-01 – 2018-04-02 (×2): 2 [IU] via SUBCUTANEOUS

## 2018-04-01 MED ORDER — LORAZEPAM 0.5 MG PO TABS
0.5000 mg | ORAL_TABLET | Freq: Four times a day (QID) | ORAL | Status: DC | PRN
  Administered 2018-04-01: 0.5 mg via ORAL
  Filled 2018-04-01: qty 1

## 2018-04-01 MED ORDER — POTASSIUM CHLORIDE CRYS ER 20 MEQ PO TBCR
40.0000 meq | EXTENDED_RELEASE_TABLET | Freq: Once | ORAL | Status: AC
  Administered 2018-04-01: 40 meq via ORAL
  Filled 2018-04-01: qty 2

## 2018-04-01 MED ORDER — INSULIN ASPART 100 UNIT/ML ~~LOC~~ SOLN
0.0000 [IU] | Freq: Three times a day (TID) | SUBCUTANEOUS | Status: DC
  Administered 2018-04-01: 2 [IU] via SUBCUTANEOUS
  Administered 2018-04-02: 5 [IU] via SUBCUTANEOUS
  Administered 2018-04-02: 3 [IU] via SUBCUTANEOUS
  Administered 2018-04-03 (×2): 2 [IU] via SUBCUTANEOUS
  Administered 2018-04-03: 3 [IU] via SUBCUTANEOUS

## 2018-04-01 MED ORDER — ALBUTEROL SULFATE (2.5 MG/3ML) 0.083% IN NEBU: 2.5000 mg | INHALATION_SOLUTION | RESPIRATORY_TRACT | Status: DC | PRN

## 2018-04-01 NOTE — Progress Notes (Addendum)
T[facility] DAILY ICU PROGRESS NOTE                   [address]            [place] [address]          [phone]       Total Length of Stay:  LOS: 2 days   Subjective: Patient states breathing improved since admission, but still not quite "normal".  Objective: Vital signs in last 24 hours: Temp:  [96.9 F (36.1 C)-98.5 F (36.9 C)] 97.4 F (36.3 C) (09/24 0735) Pulse Rate:  [68-96] 74 (09/24 0600) Cardiac Rhythm: Normal sinus rhythm (09/24 0400) Resp:  [11-19] 18 (09/24 0600) BP: (97-139)/(59-82) 133/74 (09/24 0600) SpO2:  [93 %-100 %] 95 % (09/24 0600) Weight:  [89.2 kg] 89.2 kg (09/24 0500)  Filed Weights   03/30/18 0412 03/31/18 0336 04/01/18 0500  Weight: 86.3 kg 89.2 kg 89.2 kg    Weight change: 0 kg       Intake/Output from previous day: 09/23 0701 - 09/24 0700 In: 1136.8 [P.O.:1080; I.V.:6.8; IV Piggyback:50] Out: 1401 [PVXYI:0165]  Intake/Output this shift: No intake/output data recorded.  Current Meds: Scheduled Meds: . aspirin EC  81 mg Oral Daily  . atorvastatin  40 mg Oral q1800  . Chlorhexidine Gluconate Cloth  6 each Topical Q0600  . escitalopram  10 mg Oral Daily  . fluticasone  1 spray Each Nare Daily  . guaiFENesin  600 mg Oral BID  . heparin  5,000 Units Subcutaneous Q8H  . Influenza vac split quadrivalent PF  0.5 mL Intramuscular Tomorrow-1000  . insulin aspart  0-15 Units Subcutaneous Q4H  . ipratropium-albuterol  3 mL Nebulization Q6H  . irbesartan  150 mg Oral Daily  . levothyroxine  75 mcg Oral QAC breakfast  . metFORMIN  1,000 mg Oral Q breakfast  . mupirocin ointment  1 application Nasal BID  . pantoprazole  40 mg Oral Daily  . predniSONE  50 mg Oral Q breakfast   Continuous Infusions: . sodium chloride Stopped (03/31/18 1127)   PRN Meds:.albuterol, hydrALAZINE, LORazepam, Racepinephrine HCl  General appearance: alert, cooperative and no distress Heart: RRR Lungs: clear to auscultation bilaterally, not  much stridor  Abdomen: Soft, obese, non tender, bowel sounds present Extremities: SCDs in place  Lab Results: CBC: Recent Labs    03/30/18 0313 04/01/18 0551  WBC 12.8* 12.3*  HGB 14.2 12.3  HCT 43.0 38.7  PLT 283 274   BMET:  Recent Labs    03/30/18 0313 04/01/18 0551  NA 137 139  K 4.8 3.4*  CL 102 102  CO2 25 30  GLUCOSE 198* 135*  BUN 12 19  CREATININE 0.82 0.98  CALCIUM 9.2 8.8*    CMET: Lab Results  Component Value Date   WBC 12.3 (H) 04/01/2018   HGB 12.3 04/01/2018   HCT 38.7 04/01/2018   PLT 274 04/01/2018   GLUCOSE 135 (H) 04/01/2018   ALT 24 03/24/2018   AST 25 03/24/2018   NA 139 04/01/2018   K 3.4 (L) 04/01/2018   CL 102 04/01/2018   CREATININE 0.98 04/01/2018   BUN 19 04/01/2018   CO2 30 04/01/2018   INR 1.00 03/24/2018      PT/INR: No results for input(s): LABPROT, INR in the last 72 hours. Radiology: No results found.   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  1. CV-SR in the 60-70's. 2. Pulmonary-on 2 liters of  oxygen via [place]. On Prednisone, Duo nebs and Mucinex  3. DM-CBGs 171/165/146. On Metformin and Insulin. Per primary 4. Ok to transfer out of ICU   Donielle Liston Alba PA-C 04/01/2018 7:36 AM    Patient seen and examined, agree with above  Remo Lipps C. Roxan Hockey, MD [facility] [phone]

## 2018-04-01 NOTE — Progress Notes (Signed)
[facility] TEAM 1 - Stepdown/ICU TEAM  Caitlyn Anderson  JJH:[id number] DOB: [birth year] DOA: 03/29/2018 PCP: Dairl Ponder, MD    Brief Narrative:  57 year old female with a hx of DM, Asthma, Allergic Rhinitis, GERD, HTN, and Tracheal Stenosis who presented to [facility] ED on 9/21 w/ shortness of breath, productive cough, and low grade fever for 4-5 days. On 9/16 she underwent ablation of a tracheal web by Dr. Roxan Hockey. Upon arrival to ED the patient was noted to have expiratory stridor. CT Chest with revealed subglottic tracheal stenosis more prominent than in previous studies.   Significant Events: 9/21 Presented to [facility] ED > Transfer to [facility]   Subjective: The pt remains very sob w/ the slightest exertion, but is not signif sob when sitting still.  She denies cp, n/v, or abdom pain.    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 with laser resection of tracheal web on 03/24/2018 On slow steroid taper - care per TCTS  HTN BP reasonably controlled at this time - follow w/o change in tx today   Mild hypokalemia  Supplement - check Mg   Asthma  Quiescent presently   DM CBG reasonably well controlled at this time - monitor   GERD  Cont Protonix  Anxiety  PRN ativan  MRSA screen +  DVT prophylaxis: SQ heparin  Code Status: FULL CODE Family Communication: spoke w/ family at bedside  Disposition Plan: stable for transfer to tele bed   Consultants:  TCTS PCCM  Antimicrobials:  none  Objective: Blood pressure 138/70, pulse 64, temperature (!) 97.4 F (36.3 C), temperature source Oral, resp. rate 15, height 5' (1.524 m), weight 89.2 kg, SpO2 97 %.  Intake/Output Summary (Last 24 hours) at 04/01/2018 1011 Last data filed at 04/01/2018 0800 Gross per 24 hour  Intake 1076.78 ml  Output 1401 ml  Net -324.22 ml   Filed Weights   03/30/18 0412 03/31/18 0336 04/01/18 0500  Weight: 86.3 kg 89.2 kg 89.2 kg     Examination: General: No acute respiratory distress at rest in bed  Lungs: Clear to auscultation bilaterally without wheezes or crackles Cardiovascular: Regular rate and rhythm without murmur gallop or rub normal S1 and S2 Abdomen: Nontender, nondistended, soft, bowel sounds positive, no rebound, no ascites, no appreciable mass Extremities: No significant cyanosis, clubbing, or edema bilateral lower extremities  CBC: Recent Labs  Lab 03/29/18 1706 03/30/18 0313 04/01/18 0551  WBC 13.0* 12.8* 12.3*  NEUTROABS 9.6*  --   --   HGB 14.0 14.2 12.3  HCT 41.0 43.0 38.7  MCV 95.8 96.4 99.7  PLT 265 283 818   Basic Metabolic Panel: Recent Labs  Lab 03/29/18 1706 03/29/18 2352 03/30/18 0313 04/01/18 0551  NA 138  --  137 139  K 3.7  --  4.8 3.4*  CL 101  --  102 102  CO2 28  --  25 30  GLUCOSE 143*  --  198* 135*  BUN 10  --  12 19  CREATININE 0.75  --  0.82 0.98  CALCIUM 9.2  --  9.2 8.8*  MG  --  2.0 2.1 2.2  PHOS  --   --  2.9 3.6   GFR: Estimated Creatinine Clearance: 61.5 mL/min (by C-G formula based on SCr of 0.98 mg/dL).  Liver Function Tests: No results for input(s): AST, ALT, ALKPHOS, BILITOT, PROT, ALBUMIN in the last 168 hours. No results for input(s): LIPASE, AMYLASE in the  last 168 hours. No results for input(s): AMMONIA in the last 168 hours.  Cardiac Enzymes: Recent Labs  Lab 03/29/18 1845  TROPONINI <0.03    CBG: Recent Labs  Lab 03/31/18 1114 03/31/18 1518 03/31/18 1939 04/01/18 0018 04/01/18 0335  GLUCAP 198* 181* 171* 165* 146*    Recent Results (from the past 240 hour(s))  Expectorated sputum assessment w rflx to resp cult     Status: None   Collection Time: 03/29/18 11:41 PM  Result Value Ref Range Status   Specimen Description SPUTUM  Final   Special Requests NONE  Final   Sputum evaluation   Final    THIS SPECIMEN IS ACCEPTABLE FOR SPUTUM CULTURE Performed at [facility], [place] [address] [place], [place] [address]     Report Status 03/30/2018 FINAL  Final  MRSA PCR Screening     Status: Abnormal   Collection Time: 03/29/18 11:41 PM  Result Value Ref Range Status   MRSA by PCR POSITIVE (A) NEGATIVE Final    Comment:        The GeneXpert MRSA Assay (FDA approved for NASAL specimens only), is one component of a comprehensive MRSA colonization surveillance program. It is not intended to diagnose MRSA infection nor to guide or monitor treatment for MRSA infections. RESULT CALLED TO, READ BACK BY AND VERIFIED WITH: YOUSEF,M RN 03/30/18 AT 0215 SKEEN,P Performed at [facility], [place] [address], [place], [place] [address]   Culture, respiratory     Status: None (Preliminary result)   Collection Time: 03/29/18 11:41 PM  Result Value Ref Range Status   Specimen Description SPUTUM  Final   Special Requests NONE Reflexed from [id number]  Final   Gram Stain   Final    ABUNDANT WBC PRESENT, PREDOMINANTLY PMN FEW GRAM POSITIVE COCCI IN PAIRS IN CLUSTERS Performed at [facility], [place] [address] [place], [place] [address]    Culture FEW STAPHYLOCOCCUS AUREUS  Final   Report Status PENDING  Incomplete  Culture, blood (routine x 2)     Status: None (Preliminary result)   Collection Time: 03/29/18 11:53 PM  Result Value Ref Range Status   Specimen Description BLOOD LEFT ANTECUBITAL  Final   Special Requests   Final    BOTTLES DRAWN AEROBIC AND ANAEROBIC Blood Culture adequate volume   Culture   Final    NO GROWTH 2 DAYS Performed at [facility], [place] [address], [place], [place] [address]    Report Status PENDING  Incomplete  Culture, blood (routine x 2)     Status: None (Preliminary result)   Collection Time: 03/29/18 11:58 PM  Result Value Ref Range Status   Specimen Description BLOOD RIGHT HAND  Final   Special Requests   Final    BOTTLES DRAWN AEROBIC AND ANAEROBIC Blood Culture adequate volume   Culture   Final    NO GROWTH 2 DAYS Performed at [facility], [place] [address] [place], [place] [address]    Report Status PENDING  Incomplete     Scheduled Meds: . aspirin EC  81 mg Oral Daily  . atorvastatin  40 mg Oral q1800  . Chlorhexidine Gluconate Cloth  6 each Topical Q0600  . escitalopram  10 mg Oral Daily  . fluticasone  1 spray Each Nare Daily  . guaiFENesin  600 mg Oral BID  . heparin  5,000 Units Subcutaneous Q8H  . Influenza vac split quadrivalent PF  0.5 mL Intramuscular Tomorrow-1000  .  insulin aspart  0-15 Units Subcutaneous Q4H  . ipratropium-albuterol  3 mL Nebulization Q6H  . irbesartan  150 mg Oral Daily  . levothyroxine  75 mcg Oral QAC breakfast  . metFORMIN  1,000 mg Oral Q breakfast  . mupirocin ointment  1 application Nasal BID  . pantoprazole  40 mg Oral Daily  . predniSONE  50 mg Oral Q breakfast     LOS: 2 days   Cherene Altes, MD [facility] Office  [phone] Pager - Text Page per Amion  If 7PM-7AM, please contact night-coverage per Amion 04/01/2018, 10:11 AM

## 2018-04-01 NOTE — Plan of Care (Signed)
  RD consulted for nutrition education regarding diabetes.   No results found for: HGBA1C  RD provided "Carbohydrate Counting for People with Diabetes" handout from the Academy of [employer] and Dietetics. Pt's husband and pt's sister also present during education. Discussed different food groups and their effects on blood sugar, emphasizing carbohydrate-containing foods. Provided list of carbohydrates and recommended serving sizes of common foods.  Discussed importance of controlled and consistent carbohydrate intake throughout the day. Provided examples of ways to balance meals/snacks and encouraged intake of high-fiber, whole grain complex carbohydrates. Teach back method used.  Expect good compliance.  Body mass index is 38.41 kg/m. Pt meets criteria for obesity unspecified based on current BMI.  Current diet order is Heart Healthy/Carb Modified, patient is consuming approximately 80-100% of meals at this time. Labs and medications reviewed. No further nutrition interventions warranted at this time. RD contact information provided. If additional nutrition issues arise, please re-consult RD.  Kerman Passey MS, RD, [place], [place] [phone] Pager  [phone] Weekend/On-Call Pager

## 2018-04-01 NOTE — Consult Note (Signed)
Physical Medicine and Rehabilitation Consult   Reason for Consult: Debility/Endstage OA left hip Referring Physician: Dr. Thereasa Solo    HPI: Caitlyn Anderson is a 57 y.o. 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 She was admitted via [facility] on 03/29/2018 with productive cough, shortness of breath and fevers.  Patient noted to have expiratory stridor and CT chest showed subglottic tracheal stenosis to 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  She was started on Solu-Medrol and nebs with improvement in respiratory status.  PCCM following closely to monitor for any ventilator needs.  His respiratory status reported to be improving but she continues to be limited by left hip pain as well as unsteady gait. Patient's left hip replacement has been delayed because of the surgical risk surrounding her tracheal stenosis. CIR recommended by therapy   Review of Systems  Constitutional: Positive for malaise/fatigue. Negative for chills and fever.  HENT: Negative for hearing loss and tinnitus.   Eyes: Negative for blurred vision and double vision.  Respiratory: Positive for shortness of breath and wheezing. Negative for sputum production.   Cardiovascular: Negative for chest pain and palpitations.  Gastrointestinal: Positive for diarrhea (due to metformin) and heartburn.  Genitourinary: Negative for dysuria and urgency.  Musculoskeletal: Positive for joint pain (needs left hip replacement. ).  Neurological: Negative for dizziness.  Psychiatric/Behavioral: Negative for memory loss.      Past Medical History:  Diagnosis Date  . Allergic rhinitis   . Asthma   . 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 Dyspnea   . Family history of adverse reaction to anesthesia   . GERD (gastroesophageal reflux disease)   . Hyperlipidemia   . Hypertension   . Hypothyroidism   . Left bundle branch  block     Past Surgical History:  Procedure Laterality Date  . BREAST SURGERY Left    lumpectomy  . CESAREAN SECTION    . COLONOSCOPY    . DILATION AND CURETTAGE OF UTERUS    . FLEXIBLE BRONCHOSCOPY N/A 03/24/2018   Procedure: FLEXIBLE BRONCHOSCOPY;  Surgeon: Melrose Nakayama, MD;  Location: [facility];  Service: Thoracic;  Laterality: N/A;  . LASER BRONCHOSCOPY N/A 03/24/2018   Procedure: LASER BRONCHOSCOPY FOR RESECTION OF TRACHEAL WEB;  Surgeon: Melrose Nakayama, MD;  Location: [facility] OR;  Service: Thoracic;  Laterality: N/A;  . TONSILLECTOMY    . VIDEO BRONCHOSCOPY Bilateral 02/03/2018   Procedure: VIDEO BRONCHOSCOPY WITHOUT FLUORO;  Surgeon: Collene Gobble, MD;  Location: [facility] ENDOSCOPY;  Service: Cardiopulmonary;  Laterality: Bilateral;    Family History  Problem Relation Age of Onset  . Glaucoma Father   . Macular degeneration Maternal Grandmother      Social History:  Married. Lives with husband and son who are out of the home during the day. She was independent and works part time as a Network engineer. She reports that she has never smoked. She has never used smokeless tobacco. She reports that she drinks alcohol about twice a year. She reports that she has current or past drug history.     Allergies  Allergen Reactions  . Penicillins Hives and Rash    Has patient had a PCN reaction causing immediate rash, facial/tongue/throat swelling, SOB or lightheadedness with hypotension: No Has patient had a PCN reaction causing severe rash involving mucus membranes or skin necrosis: No Has patient had a  PCN reaction that required hospitalization: No Has patient had a PCN reaction occurring within the last 10 years: No If all of the above answers are "NO", then may proceed with Cephalosporin use.     Medications Prior to Admission  Medication Sig Dispense Refill  . albuterol (PROAIR HFA) 108 (90 Base) MCG/ACT inhaler Inhale 2 puffs into the lungs 4 (four) times daily as needed for  wheezing or shortness of breath.     . ALPRAZolam (XANAX) 0.25 MG tablet Take 0.25 mg by mouth daily as needed for anxiety.    Marland Kitchen aspirin 81 MG tablet Take 81 mg by mouth daily.     Marland Kitchen atorvastatin (LIPITOR) 20 MG tablet Take 20 tablets by mouth daily.     . cetirizine (ZYRTEC) 10 MG tablet Take 10 mg by mouth at bedtime.     . Cholecalciferol 2000 units TABS Take 2,000 Units by mouth daily.     Marland Kitchen escitalopram (LEXAPRO) 10 MG tablet Take 10 mg by mouth daily.     Marland Kitchen esomeprazole (NEXIUM) 20 MG capsule Take 40 mg by mouth daily at 12 noon.    . fluticasone (FLONASE) 50 MCG/ACT nasal spray Inhale 1 spray into the lungs daily.    . Hypromellose 0.3 % SOLN Place 1 application into both eyes at bedtime.    Marland Kitchen levothyroxine (SYNTHROID, LEVOTHROID) 75 MCG tablet Take 75 mcg by mouth daily before breakfast.    . metFORMIN (GLUCOPHAGE) 1000 MG tablet Take 1,000 mg by mouth daily.     . valsartan (DIOVAN) 160 MG tablet Take 1 tablet (160 mg total) by mouth daily. 31 tablet 4  . acetaminophen (TYLENOL) 500 MG tablet Take 1,000 mg by mouth every 6 (six) hours as needed for mild pain.      Home: Home Living Family/patient expects to be discharged to:: Private residence Living Arrangements: Spouse/significant other Available Help at Discharge: Available PRN/intermittently Type of Home: House Home Access: Stairs to enter Technical brewer of Steps: 3 Entrance Stairs-Rails: None Home Layout: One level Bathroom Shower/Tub: Chiropodist: Standard Bathroom Accessibility: Yes Home Equipment: Bernard - single point  Functional History: Prior Function Level of Independence: Independent with assistive device(s) Comments: limited community ambulation due to hip pain and decreased breathing, independent with bathing and dressing increased time and effort  Functional Status:  Mobility: Bed Mobility Overal bed mobility: Modified Independent General bed mobility comments: increased time  and effort to reach EoB Transfers Overall transfer level: Needs assistance Equipment used: Rolling walker (2 wheeled) Transfers: Sit to/from Stand Sit to Stand: Min assist General transfer comment: minA for steadying with RW, good powerup  Ambulation/Gait Ambulation/Gait assistance: Min Web designer (Feet): 35 Feet Assistive device: Rolling walker (2 wheeled) Gait Pattern/deviations: Decreased step length - right, Decreased stance time - left, Decreased stride length, Shuffle, Antalgic, Step-through pattern General Gait Details: minA for steadying with RW, increased pain in L hip with ambulation, vc for use of RW to offload L hip,  Gait velocity: slowed Gait velocity interpretation: <1.8 ft/sec, indicate of risk for recurrent falls    ADL:    Cognition: Cognition Overall Cognitive Status: Within Functional Limits for tasks assessed Orientation Level: Oriented X4 Cognition Arousal/Alertness: Awake/alert Behavior During Therapy: WFL for tasks assessed/performed Overall Cognitive Status: Within Functional Limits for tasks assessed   Blood pressure (!) 156/77, pulse 93, temperature 98.2 F (36.8 C), temperature source Oral, resp. rate 14, height 5' (1.524 m), weight 89.2 kg, SpO2 96 %. Physical Exam  Nursing  note and vitals reviewed. Constitutional: She is oriented to person, place, and time. She appears well-developed.  Sitting up in chair. Pleasant and appropriate. Occasional stridor noted with conversation.   HENT:  Head: Normocephalic.  Eyes: Pupils are equal, round, and reactive to light.  Neck: Normal range of motion.  Cardiovascular: Normal rate.  Respiratory: She has wheezes (expiratory).  Stridor, voice hoarse at times. No respiratory stress  GI: Soft.  Musculoskeletal:  Left hip limited by pain  Neurological: She is alert and oriented to person, place, and time.  UE 4/5. RLE 4-/5 prox to 5/5 distally. LLE: 2+ HF, 4- KE and 4+ ADF/PF. No gross sensory  findings  Skin: Skin is warm.  Psychiatric: She has a normal mood and affect. Her behavior is normal.    Results for orders placed or performed during the hospital encounter of 03/29/18 (from the past 24 hour(s))  Glucose, capillary     Status: Abnormal   Collection Time: 03/31/18  3:18 PM  Result Value Ref Range   Glucose-Capillary 181 (H) 70 - 99 mg/dL  Glucose, capillary     Status: Abnormal   Collection Time: 03/31/18  7:39 PM  Result Value Ref Range   Glucose-Capillary 171 (H) 70 - 99 mg/dL  Glucose, capillary     Status: Abnormal   Collection Time: 04/01/18 12:18 AM  Result Value Ref Range   Glucose-Capillary 165 (H) 70 - 99 mg/dL  Glucose, capillary     Status: Abnormal   Collection Time: 04/01/18  3:35 AM  Result Value Ref Range   Glucose-Capillary 146 (H) 70 - 99 mg/dL  CBC     Status: Abnormal   Collection Time: 04/01/18  5:51 AM  Result Value Ref Range   WBC 12.3 (H) 4.0 - 10.5 K/uL   RBC 3.88 3.87 - 5.11 MIL/uL   Hemoglobin 12.3 12.0 - 15.0 g/dL   HCT 38.7 36.0 - 46.0 %   MCV 99.7 78.0 - 100.0 fL   MCH 31.7 26.0 - 34.0 pg   MCHC 31.8 30.0 - 36.0 g/dL   RDW 12.9 11.5 - 15.5 %   Platelets 274 150 - 400 K/uL  Basic metabolic panel     Status: Abnormal   Collection Time: 04/01/18  5:51 AM  Result Value Ref Range   Sodium 139 135 - 145 mmol/L   Potassium 3.4 (L) 3.5 - 5.1 mmol/L   Chloride 102 98 - 111 mmol/L   CO2 30 22 - 32 mmol/L   Glucose, Bld 135 (H) 70 - 99 mg/dL   BUN 19 6 - 20 mg/dL   Creatinine, Ser 0.98 0.44 - 1.00 mg/dL   Calcium 8.8 (L) 8.9 - 10.3 mg/dL   GFR calc non Af Amer >60 >60 mL/min   GFR calc Af Amer >60 >60 mL/min   Anion gap 7 5 - 15  Magnesium     Status: None   Collection Time: 04/01/18  5:51 AM  Result Value Ref Range   Magnesium 2.2 1.7 - 2.4 mg/dL  Phosphorus     Status: None   Collection Time: 04/01/18  5:51 AM  Result Value Ref Range   Phosphorus 3.6 2.5 - 4.6 mg/dL  Glucose, capillary     Status: Abnormal   Collection  Time: 04/01/18  7:34 AM  Result Value Ref Range   Glucose-Capillary 116 (H) 70 - 99 mg/dL  Glucose, capillary     Status: Abnormal   Collection Time: 04/01/18 11:22 AM  Result Value Ref Range  Glucose-Capillary 120 (H) 70 - 99 mg/dL   No results found.   Assessment/Plan: Diagnosis: Functional and mobility deficits secondary to end-stage OA of the left hip and debility related to tracheal stenosis/respiratory failure 1. Does the need for close, 24 hr/day medical supervision in concert with the patient's rehab needs make it unreasonable for this patient to be served in a less intensive setting? Yes 2. Co-Morbidities requiring supervision/potential complications: pain mgt, pulmonary considerations,  3. Due to bladder management, bowel management, safety, skin/wound care, disease management, medication administration, pain management and patient education, does the patient require 24 hr/day rehab nursing? Yes 4. Does the patient require coordinated care of a physician, rehab nurse, PT (1-2 hrs/day, 5 days/week) and OT (1-2 hrs/day, 5 days/week) to address physical and functional deficits in the context of the above medical diagnosis(es)? Yes Addressing deficits in the following areas: balance, endurance, locomotion, strength, transferring, bowel/bladder control, bathing, dressing, feeding, grooming, toileting and psychosocial support 5. Can the patient actively participate in an intensive therapy program of at least 3 hrs of therapy per day at least 5 days per week? Yes 6. The potential for patient to make measurable gains while on inpatient rehab is excellent 7. Anticipated functional outcomes upon discharge from inpatient rehab are modified independent  with PT, modified independent with OT, n/a with SLP. 8. Estimated rehab length of stay to reach the above functional goals is: 7 days 9. Anticipated D/C setting: Home 10. Anticipated post D/C treatments: Aneth therapy 11. Overall Rehab/Functional  Prognosis: excellent  RECOMMENDATIONS: This patient's condition is appropriate for continued rehabilitative care in the following setting: CIR Patient has agreed to participate in recommended program. Yes Note that insurance prior authorization may be required for reimbursement for recommended care.  Comment: Pt would benefit from the intensity of our program to push her to a modified independent level of function while aggressively managing pain and pulmonary issues concomitantly.   Her husband works, and she needs to be independent during the day at the very least. Patient is motivated to work with therapies. Rehab Admissions Coordinator to follow up.  Thanks,  Meredith Staggers, MD, Mellody Drown  I have personally performed a face to face diagnostic evaluation of this patient. Additionally, I have reviewed and concur with the physician assistant's documentation above.    Bary Leriche, PA-C 04/01/2018

## 2018-04-01 NOTE — Progress Notes (Signed)
Rehab Admissions Coordinator Note:  Patient was screened by Cleatrice Burke for appropriateness for an Inpatient Acute Rehab Consult per PT recommendation.   At this time, we are recommending Inpatient Rehab consult.  Danne Baxter, RN, MSN Rehab Admissions Coordinator [phone] 04/01/2018 11:20 AM

## 2018-04-01 NOTE — Evaluation (Signed)
Physical Therapy Evaluation Patient Details Name: Caitlyn Anderson MRN: [id number] DOB: Feb 27, 1961 Today's Date: 04/01/2018   History of Present Illness  57 year old female with PMH of DM, Asthma, Allergic Rhinitis, GERD, HTN, Tracheal Stenosis. Admitted for observation of worsening stridor following tracheal web resection 9/16  Clinical Impression  PTA pt independent with limited community ambulation using a cane, and independent with iADLs,  although mobility was getting more difficult due to L hip pain and increasing SoB with activity. Pt had tracheal ablation performed to improve airway in preparation for L THA. Inflammation of trachea is currently causing increased work of breathing with mobility. Pt currently requires mod I for bed mobility, and minA for transfers and for ambulation of 35 feet with RW. PT is motivated to return to PLOF and needs rehab to maintain strength and reduce pain in L hip while airway is improved in preparation for L THA. PT recommends CIR level rehab to help pt reach her goals. PT will continue to follow acutely.     Follow Up Recommendations CIR    Equipment Recommendations  Other (comment)(TBD at next venue)    Recommendations for Other Services Rehab consult;OT consult     Precautions / Restrictions Precautions Precautions: Fall Restrictions Weight Bearing Restrictions: No      Mobility  Bed Mobility Overal bed mobility: Modified Independent             General bed mobility comments: increased time and effort to reach EoB  Transfers Overall transfer level: Needs assistance Equipment used: Rolling walker (2 wheeled) Transfers: Sit to/from Stand Sit to Stand: Min assist         General transfer comment: minA for steadying with RW, good powerup   Ambulation/Gait Ambulation/Gait assistance: Min assist Gait Distance (Feet): 35 Feet Assistive device: Rolling walker (2 wheeled) Gait Pattern/deviations: Decreased step length - right;Decreased  stance time - left;Decreased stride length;Shuffle;Antalgic;Step-through pattern Gait velocity: slowed Gait velocity interpretation: <1.8 ft/sec, indicate of risk for recurrent falls General Gait Details: minA for steadying with RW, increased pain in L hip with ambulation, vc for use of RW to offload L hip,         Balance Overall balance assessment: Needs assistance Sitting-balance support: Feet supported;No upper extremity supported Sitting balance-Leahy Scale: Good     Standing balance support: Bilateral upper extremity supported Standing balance-Leahy Scale: Poor                               Pertinent Vitals/Pain Pain Assessment: 0-10 Pain Score: 3  Pain Location: Lhip with movement Pain Descriptors / Indicators: Aching;Sore Pain Intervention(s): Limited activity within patient's tolerance;Monitored during session;Repositioned    Home Living Family/patient expects to be discharged to:: Private residence Living Arrangements: Spouse/significant other Available Help at Discharge: Available PRN/intermittently Type of Home: House Home Access: Stairs to enter Entrance Stairs-Rails: None Entrance Stairs-Number of Steps: 3 Home Layout: One level Home Equipment: Cane - single point      Prior Function Level of Independence: Independent with assistive device(s)         Comments: limited community ambulation due to hip pain and decreased breathing, independent with bathing and dressing increased time and effort         Extremity/Trunk Assessment   Upper Extremity Assessment Upper Extremity Assessment: Overall WFL for tasks assessed    Lower Extremity Assessment Lower Extremity Assessment: LLE deficits/detail LLE Deficits / Details: hip AROM limited, strength grossly assessed  at 2+/5, knee and ankle ROM WFL, strength grossly assessed 4/5(pt plans L THA, tracheal sx in preparation) LLE: Unable to fully assess due to pain    Cervical / Trunk  Assessment Cervical / Trunk Assessment: Kyphotic  Communication   Communication: No difficulties  Cognition Arousal/Alertness: Awake/alert Behavior During Therapy: WFL for tasks assessed/performed Overall Cognitive Status: Within Functional Limits for tasks assessed                                        General Comments General comments (skin integrity, edema, etc.): pt on RA and able to maintain SaO2 >93%O2 throughout, however experienced 3/4 DoE with ambulation, max HR 123 bpm, quickly recovered HR to 78 bpm with sitting.        Assessment/Plan    PT Assessment Patient needs continued PT services  PT Problem List Decreased strength;Decreased range of motion;Decreased activity tolerance;Decreased balance;Decreased mobility;Decreased knowledge of use of DME;Decreased safety awareness;Cardiopulmonary status limiting activity       PT Treatment Interventions DME instruction;Gait training;Stair training;Functional mobility training;Therapeutic activities;Therapeutic exercise;Balance training;Patient/family education    PT Goals (Current goals can be found in the Care Plan section)  Acute Rehab PT Goals Patient Stated Goal: attend nieces wedding in 3 weeks PT Goal Formulation: With patient Time For Goal Achievement: 04/15/18 Potential to Achieve Goals: Good    Frequency Min 3X/week    AM-PAC PT "6 Clicks" Daily Activity  Outcome Measure Difficulty turning over in bed (including adjusting bedclothes, sheets and blankets)?: A Little Difficulty moving from lying on back to sitting on the side of the bed? : A Little Difficulty sitting down on and standing up from a chair with arms (e.g., wheelchair, bedside commode, etc,.)?: Unable Help needed moving to and from a bed to chair (including a wheelchair)?: A Little Help needed walking in hospital room?: A Little Help needed climbing 3-5 steps with a railing? : A Lot 6 Click Score: 15    End of Session Equipment  Utilized During Treatment: Gait belt Activity Tolerance: Patient tolerated treatment well Patient left: in chair;with call bell/phone within reach;with chair alarm set;with family/visitor present Nurse Communication: Mobility status PT Visit Diagnosis: Unsteadiness on feet (R26.81);Other abnormalities of gait and mobility (R26.89);Muscle weakness (generalized) (M62.81);Difficulty in walking, not elsewhere classified (R26.2);Pain Pain - Right/Left: Left Pain - part of body: Hip    Time: 0920-0958 PT Time Calculation (min) (ACUTE ONLY): 38 min   Charges:   PT Evaluation $PT Eval Moderate Complexity: 1 Mod PT Treatments $Gait Training: 8-22 mins        Lodie Waheed B. Migdalia Dk PT, DPT Acute Rehabilitation Services Pager [phone] Office [phone]   [place] 04/01/2018, 10:44 AM

## 2018-04-02 DIAGNOSIS — R061 Stridor: Secondary | ICD-10-CM

## 2018-04-02 LAB — CBC
HCT: 40.5 % (ref 36.0–46.0)
Hemoglobin: 13 g/dL (ref 12.0–15.0)
MCH: 31.3 pg (ref 26.0–34.0)
MCHC: 32.1 g/dL (ref 30.0–36.0)
MCV: 97.6 fL (ref 78.0–100.0)
PLATELETS: 279 10*3/uL (ref 150–400)
RBC: 4.15 MIL/uL (ref 3.87–5.11)
RDW: 12.5 % (ref 11.5–15.5)
WBC: 12.3 10*3/uL — ABNORMAL HIGH (ref 4.0–10.5)

## 2018-04-02 LAB — COMPREHENSIVE METABOLIC PANEL
ALBUMIN: 3.4 g/dL — AB (ref 3.5–5.0)
ALT: 19 U/L (ref 0–44)
AST: 16 U/L (ref 15–41)
Alkaline Phosphatase: 63 U/L (ref 38–126)
Anion gap: 10 (ref 5–15)
BUN: 17 mg/dL (ref 6–20)
CALCIUM: 8.9 mg/dL (ref 8.9–10.3)
CO2: 26 mmol/L (ref 22–32)
CREATININE: 0.85 mg/dL (ref 0.44–1.00)
Chloride: 100 mmol/L (ref 98–111)
GFR calc Af Amer: >60 mL/min (ref >60)
GFR calc non Af Amer: >60 mL/min (ref >60)
GLUCOSE: 156 mg/dL — AB (ref 70–99)
Potassium: 4.2 mmol/L (ref 3.5–5.1)
Sodium: 136 mmol/L (ref 135–145)
Total Bilirubin: 0.6 mg/dL (ref 0.3–1.2)
Total Protein: 6.3 g/dL — ABNORMAL LOW (ref 6.5–8.1)

## 2018-04-02 LAB — CULTURE, RESPIRATORY W GRAM STAIN

## 2018-04-02 LAB — GLUCOSE, CAPILLARY
GLUCOSE-CAPILLARY: 135 mg/dL — AB (ref 70–99)
GLUCOSE-CAPILLARY: 219 mg/dL — AB (ref 70–99)
GLUCOSE-CAPILLARY: 226 mg/dL — AB (ref 70–99)
Glucose-Capillary: 161 mg/dL — ABNORMAL HIGH (ref 70–99)

## 2018-04-02 LAB — CULTURE, RESPIRATORY

## 2018-04-02 LAB — MAGNESIUM: Magnesium: 2.1 mg/dL (ref 1.7–2.4)

## 2018-04-02 MED ORDER — PREDNISONE 20 MG PO TABS
40.0000 mg | ORAL_TABLET | Freq: Every day | ORAL | Status: DC
  Administered 2018-04-03: 40 mg via ORAL
  Filled 2018-04-02: qty 2

## 2018-04-02 NOTE — PMR Pre-admission (Signed)
PMR Admission Coordinator Pre-Admission Assessment  Patient: Caitlyn Anderson is an 57 y.o., female MRN: [id number] DOB: 22-Sep-1960 Height: 5' (152.4 cm) Weight: 89.2 kg              Insurance Information HMO:     PPO: Yes     PCP:      IPA:      80/20:      OTHER: [employer] [place] PRIMARY: [employer]       Policy#: [id number]      Subscriber: Spouse CM Name: Isa Rankin      Phone#: [phone]     Fax#: [phone] Pre-Cert#: [id number]  For 04/03/18-04/16/18 with updates due on 04/15/18 if more time is warranted   Employer: Full Time Benefits:  Phone #: [phone]     Name: Verified online at [email or web address] Eff. Date: 05/10/07-present     Deduct: $150      Out of Pocket Max: $1000      Life Max: N/A CIR: 90%/10%      SNF: 90%/10% Outpatient: 60 combined PT/OT visits per calendar year      Co-Pay: $20 per visit  Home Health: 30 visits per calendar year, 90%      Co-Pay: 10% DME: 90%     Co-Pay: 10% Providers: In-network   SECONDARY: None        Emergency Contact Information Contact Information    Name Relation Home Work [address]   [name], [name] [facility] [phone]  [phone]     Current Medical History  Patient Admitting Diagnosis: Functional and mobility deficits secondary to end-stage OA of the left hip and debility related to tracheal stenosis/respiratory failure  History of Present Illness: [name] is a 57 year old female with history of T2DM, GERD, asthma history with DOE and 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  She was admitted on 03/29/2018 with productive cough, shortness of breath and fevers she was noted to have expiratory stridor and 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  She was treated with  Solu-Medrol and nebs with improvement in respiratory status.  PCCM following for input and has signed off as respiratory status has improved.  Therapy evaluations done with patient limited by left hip pain with  unsteady gait as well as shortness of breath.  CIR recommended due to functional deficits and patient admitted 04/03/18.      Past Medical History  Past Medical History:  Diagnosis Date  . Allergic rhinitis   . Asthma   . 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 Dyspnea   . Family history of adverse reaction to anesthesia   . GERD (gastroesophageal reflux disease)   . Hyperlipidemia   . Hypertension   . Hypothyroidism   . Left bundle branch block     Family History  family history includes Glaucoma in her father; Macular degeneration in her maternal grandmother.  Prior Rehab/Hospitalizations:  Has the patient had major surgery during 100 days prior to admission? Yes  Current Medications   Current Facility-Administered Medications:  .  albuterol (PROVENTIL) (2.5 MG/3ML) 0.083% nebulizer solution 2.5 mg, 2.5 mg, Nebulization, Q2H PRN, Cherene Altes, MD .  aspirin EC tablet 81 mg, 81 mg, Oral, Daily, Agarwala, Ravi, MD, 81 mg at 04/03/18 1128 .  atorvastatin (LIPITOR) tablet 40 mg, 40 mg, Oral, q1800, Agarwala, Ravi, MD, 40 mg at 03/31/18 1751 .  Chlorhexidine Gluconate Cloth 2 % PADS 6 each, 6 each, Topical, Q0600,  Kipp Brood, MD, 6 each at 04/02/18 1100 .  escitalopram (LEXAPRO) tablet 10 mg, 10 mg, Oral, Daily, Agarwala, Ravi, MD, 10 mg at 04/03/18 1128 .  fluticasone (FLONASE) 50 MCG/ACT nasal spray 1 spray, 1 spray, Each Nare, Daily, Agarwala, Ravi, MD, 1 spray at 04/03/18 1129 .  guaiFENesin (MUCINEX) 12 hr tablet 600 mg, 600 mg, Oral, BID, Melrose Nakayama, MD, 600 mg at 04/03/18 1128 .  heparin injection 5,000 Units, 5,000 Units, Subcutaneous, Q8H, Omar Person, NP, 5,000 Units at 04/02/18 2252 .  hydrALAZINE (APRESOLINE) injection 10 mg, 10 mg, Intravenous, Q6H PRN, Dewaine Oats, Katalina M, NP .  insulin aspart (novoLOG) injection 0-15 Units, 0-15 Units, Subcutaneous, TID WC, Cherene Altes, MD, 2 Units at 04/03/18 1207 .  insulin  aspart (novoLOG) injection 0-5 Units, 0-5 Units, Subcutaneous, QHS, Cherene Altes, MD, 2 Units at 04/02/18 2253 .  ipratropium-albuterol (DUONEB) 0.5-2.5 (3) MG/3ML nebulizer solution 3 mL, 3 mL, Nebulization, BID, Elodia Florence., MD, 3 mL at 04/03/18 0843 .  irbesartan (AVAPRO) tablet 150 mg, 150 mg, Oral, Daily, Agarwala, Ravi, MD, 150 mg at 04/03/18 1128 .  levothyroxine (SYNTHROID, LEVOTHROID) tablet 75 mcg, 75 mcg, Oral, QAC breakfast, Kipp Brood, MD, 75 mcg at 04/03/18 0849 .  LORazepam (ATIVAN) tablet 0.5 mg, 0.5 mg, Oral, Q6H PRN, Cherene Altes, MD, 0.5 mg at 04/01/18 2201 .  metFORMIN (GLUCOPHAGE) tablet 1,000 mg, 1,000 mg, Oral, Q breakfast, Rush Farmer, MD, 1,000 mg at 04/03/18 0849 .  pantoprazole (PROTONIX) EC tablet 40 mg, 40 mg, Oral, Daily, Agarwala, Ravi, MD, 40 mg at 04/03/18 1128 .  predniSONE (DELTASONE) tablet 40 mg, 40 mg, Oral, Q breakfast, Elodia Florence., MD, 40 mg at 04/03/18 0849 .  Racepinephrine HCl 2.25 % nebulizer solution 0.5 mL, 0.5 mL, Nebulization, Q1H PRN, Hammonds, Sharyn Blitz, MD  Patients Current Diet:  Diet Order            Diet Carb Modified Fluid consistency: Thin; Room service appropriate? 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  She was fully independent, at work and at home with her single point cane.    Home Assistive Devices / Equipment Home Assistive Devices/Equipment: Cane (specify quad or straight) Home Equipment: Cane - single point  Prior Device Use: Indicate devices/aids used  by the patient prior to current illness, exacerbation or injury? Single point cane   Prior Functional Level Prior Function Level of Independence: Independent with assistive device(s) Comments: pt using cane, avoided socks due to inability to don, increased time for donning pants, difficulty rising from toilet  Self Care: Did the patient need help bathing, dressing, using the toilet or eating? Independent  Indoor Mobility: Did the patient need assistance with walking from room to room (with or without device)? Independent  Stairs: Did the patient need assistance with internal or external stairs (with or without device)? Independent  Functional Cognition: Did the patient need help planning regular tasks such as shopping or remembering to take medications? Independent  Current Functional  Level Cognition  Overall Cognitive Status: Within Functional Limits for tasks assessed Orientation Level: Oriented X4    Extremity Assessment (includes Sensation/Coordination)  Upper Extremity Assessment: Overall WFL for tasks assessed  Lower Extremity Assessment: Defer to PT evaluation LLE Deficits / Details: hip AROM limited, strength grossly assessed at 2+/5, knee and ankle ROM WFL, strength grossly assessed 4/5(pt plans L THA, tracheal sx in preparation) LLE: Unable to fully assess due to pain    ADLs  Overall ADL's : Needs assistance/impaired Eating/Feeding: Independent, Sitting Grooming: Min guard, Wash/dry hands, Standing Upper Body Bathing: Set up, Sitting Lower Body Bathing: Minimal assistance, Sit to/from stand Upper Body Dressing : Set up, Sitting Lower Body Dressing: Minimal assistance, Sit to/from stand Toilet Transfer: Min guard, Ambulation, RW, BSC Toileting- Clothing Manipulation and Hygiene: Minimal assistance, Sit to/from stand Functional mobility during ADLs: Minimal assistance, Min guard, Rolling walker(very slow)    Mobility  Overal bed mobility: Modified Independent General  bed mobility comments: pt received in chair    Transfers  Overall transfer level: Needs assistance Equipment used: Rolling walker (2 wheeled) Transfers: Sit to/from Stand Sit to Stand: Min guard General transfer comment: min guard for safety, decreased eccentric control during descent     Ambulation / Gait / Stairs / Emergency planning/management officer  Ambulation/Gait Ambulation/Gait assistance: Herbalist (Feet): 35 Feet Assistive device: Rolling walker (2 wheeled) Gait Pattern/deviations: Decreased step length - right, Decreased stance time - left, Decreased stride length, Shuffle, Antalgic, Step-through pattern General Gait Details: minA for steadying with RW, increased pain in L hip with ambulation, vc for use of RW to offload L hip,  Gait velocity: slowed Gait velocity interpretation: <1.8 ft/sec, indicate of risk for recurrent falls    Posture / Balance Balance Overall balance assessment: Needs assistance Sitting-balance support: Feet supported, No upper extremity supported Sitting balance-Leahy Scale: Good Standing balance support: Bilateral upper extremity supported Standing balance-Leahy Scale: Poor    Special needs/care consideration BiPAP/CPAP: No CPM: No Continuous Drip IV: No Dialysis: No         Life Vest: No Oxygen: No Special Bed: No Trach Size: No Wound Vac (area): No       Skin: WDL, Dry                               Bowel mgmt: Continent, last BM 04/02/18 Bladder mgmt: Continent  Diabetic mgmt: Yes, with oral medication and diet prior to admission      Previous Home Environment Living Arrangements: Spouse/significant other Available Help at Discharge: Family, Available PRN/intermittently(spouse works) Type of Home: House Home Layout: One level Home Access: Stairs to enter Entrance Stairs-Rails: None Technical brewer of Steps: 3 Bathroom Shower/Tub: Chiropodist: Programmer, systems: Yes Home Care Services:  No  Discharge Living Setting Plans for Discharge Living Setting: Patient's home, Lives with (comment)(spouse) Type of Home at Discharge: House Discharge Home Layout: One level Discharge Home Access: Stairs to enter Entrance Stairs-Rails: None Entrance Stairs-Number of Steps: 3 Discharge Bathroom Shower/Tub: Tub/shower unit, Curtain Discharge Bathroom Toilet: Standard Discharge Bathroom Accessibility: Yes How Accessible: (cane or walker ) Does the patient have any problems obtaining your medications?: No  Social/Family/Support Systems Patient Roles: Spouse Contact Information: Spouse: David  Anticipated Caregiver: Patient with Mod I goals  Anticipated Caregiver's Contact Information: [phone] Ability/Limitations of Caregiver: Spouse works 12 hours shifts  Caregiver Availability: Intermittent Discharge Plan Discussed with Primary Caregiver: Yes Is Caregiver In  Agreement with Plan?: Yes Does Caregiver/Family have Issues with Lodging/Transportation while Pt is in Rehab?: No  Goals/Additional Needs Patient/Family Goal for Rehab: PT/Ot: Mod I  Expected length of stay: ~7days  Cultural Considerations: Christian  Dietary Needs: Carb. Mod. diet restrictions  Equipment Needs: TBD Pt/Family Agrees to Admission and willing to participate: Yes Program Orientation Provided & Reviewed with Pt/Caregiver Including Roles  & Responsibilities: Yes Additional Information Needs: Close monitoring for resp. status Information Needs to be Provided By: Team FYI  Barriers to Discharge: Medical stability, Other (comments)(Mod I given that spouse works )  Decrease burden of Care through IP rehab admission: No  Possible need for SNF placement upon discharge: No  Patient Condition: This patient's medical and functional status has changed since the consult dated: 04/01/18 at 1347 in which the Rehabilitation Physician determined and documented that the patient's condition is appropriate for  intensive rehabilitative care in an inpatient rehabilitation facility. See "History of Present Illness" (above) for medical update. Functional changes are: Min guard transfers and Min A gait with rolling walker. Patient's medical and functional status update has been discussed with the Rehabilitation physician and patient remains appropriate for inpatient rehabilitation. Will admit to inpatient rehab today.  Preadmission Screen Completed By:  Gunnar Fusi, 04/03/2018 1:00 PM ______________________________________________________________________   Discussed status with Dr. Letta Pate on 04/03/18 at 1555 and received telephone approval for admission today.  Admission Coordinator:  Gunnar Fusi, time 1555/Date 04/03/18

## 2018-04-02 NOTE — Progress Notes (Signed)
PROGRESS NOTE    Caitlyn Anderson  YSA:[id number] DOB: 1960-12-18 DOA: 03/29/2018 PCP: Dairl Ponder, MD   Brief Narrative:  57 year old female with a hx of DM, Asthma, Allergic Rhinitis, GERD, HTN, and Tracheal Stenosis who presented to [facility] ED on 9/21 w/ shortness of breath, productive cough, and low grade fever for 4-5 days. On 9/16 she underwent ablation of a tracheal web by Dr. Roxan Hockey. Upon arrival to ED the patient was noted to have expiratory stridor. CT Chest with revealed subglottic tracheal stenosis more prominent than in previous studies.   Assessment & Plan:   Active Problems:   Tracheal stenosis  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 - S/P flexible bronchoscopy with laser resection of tracheal web on 03/24/2018.  Continued stridor today, discussed with CT surgery PA who noted planning for continued monitoring. On slow steroid taper - care per TCTS  HTN BP reasonably controlled at this time, stable.  Follow.  Mild hypokalemia  Improved, follow  Asthma  Quiescent presently, no wheezing, but stridor present  DM CBG reasonably well controlled at this time - stable, follow   GERD  Cont Protonix  Anxiety  PRN ativan  Leukocytosis: mild, 2/2 steroids, follow  MSSA positive sputum, but no si/sx of pneumonia, afebrile follow  MRSA screen +  DVT prophylaxis: heparin Code Status: full  Family Communication: sister at bedside Disposition Plan: pending clearance by CT surgery, planning for further monitoring with continued stridor    Consultants:   PCCM  CT surgery  Procedures:  9/16  Flexible fiberoptic bronchoscopy with laser resection of tracheal web.  Antimicrobials:  Anti-infectives (From admission, onward)   None      Subjective: C/o doe.  Shortness of  Breath last night after getting to bedside commode and lying flat.  Objective: Vitals:   04/02/18 0800 04/02/18 0814 04/02/18 1000 04/02/18 1122  BP: 119/72  (!) 154/108     Pulse: 64  85   Resp: 16  14   Temp:    98.4 F (36.9 C)  TempSrc:    Oral  SpO2: 97% 100% 97%   Weight:      Height:        Intake/Output Summary (Last 24 hours) at 04/02/2018 1446 Last data filed at 04/02/2018 0547 Gross per 24 hour  Intake 180 ml  Output 2500 ml  Net -2320 ml   Filed Weights   03/30/18 0412 03/31/18 0336 04/01/18 0500  Weight: 86.3 kg 89.2 kg 89.2 kg    Examination:  General exam: Appears calm and comfortable  Respiratory system: Clear to auscultation. Respiratory effort normal.  Inspiratory stridor. Cardiovascular system: S1 & S2 heard, RRR. Gastrointestinal system: Abdomen is nondistended, soft and nontender. Central nervous system: Alert and oriented. No focal neurological deficits. Extremities: Symmetric 5 x 5 power. Skin: No rashes, lesions or ulcers Psychiatry: Judgement and insight appear normal. Mood & affect appropriate.     Data Reviewed: I have personally reviewed following labs and imaging studies  CBC: Recent Labs  Lab 03/29/18 1706 03/30/18 0313 04/01/18 0551 04/02/18 0418  WBC 13.0* 12.8* 12.3* 12.3*  NEUTROABS 9.6*  --   --   --   HGB 14.0 14.2 12.3 13.0  HCT 41.0 43.0 38.7 40.5  MCV 95.8 96.4 99.7 97.6  PLT 265 283 274 323   Basic Metabolic Panel: Recent Labs  Lab 03/29/18 1706 03/29/18 2352 03/30/18 0313 04/01/18 0551 04/02/18 0418  NA 138  --  137 139 136  K 3.7  --  4.8 3.4* 4.2  CL 101  --  102 102 100  CO2 28  --  _0 GLUCOSE 143*  --  198* 135* 156*  BUN 10  --  _1 CREATININE 0.75  --  0.82 0.98 0.85  CALCIUM 9.2  --  9.2 8.8* 8.9  MG  --  2.0 2.1 2.2 2.1  PHOS  --   --  2.9 3.6  --    GFR: Estimated Creatinine Clearance: 70.9 mL/min (by C-G formula based on SCr of 0.85 mg/dL). Liver Function Tests: Recent Labs  Lab 04/02/18 0418  AST 16  ALT 19  ALKPHOS 63  BILITOT 0.6  PROT 6.3*  ALBUMIN 3.4*   No results for input(s): LIPASE, AMYLASE in the last 168 hours. No results for  input(s): AMMONIA in the last 168 hours. Coagulation Profile: No results for input(s): INR, PROTIME in the last 168 hours. Cardiac Enzymes: Recent Labs  Lab 03/29/18 1845  TROPONINI <0.03   BNP (last 3 results) No results for input(s): PROBNP in the last 8760 hours. HbA1C: No results for input(s): HGBA1C in the last 72 hours. CBG: Recent Labs  Lab 04/01/18 1122 04/01/18 1523 04/01/18 1736 04/01/18 2152 04/02/18 1029  GLUCAP 120* 204* 213* 237* 135*   Lipid Profile: No results for input(s): CHOL, HDL, LDLCALC, TRIG, CHOLHDL, LDLDIRECT in the last 72 hours. Thyroid Function Tests: No results for input(s): TSH, T4TOTAL, FREET4, T3FREE, THYROIDAB in the last 72 hours. Anemia Panel: No results for input(s): VITAMINB12, FOLATE, FERRITIN, TIBC, IRON, RETICCTPCT in the last 72 hours. Sepsis Labs: Recent Labs  Lab 03/29/18 2352  PROCALCITON <0.10  LATICACIDVEN 1.4    Recent Results (from the past 240 hour(s))  Expectorated sputum assessment w rflx to resp cult     Status: None   Collection Time: 03/29/18 11:41 PM  Result Value Ref Range Status   Specimen Description SPUTUM  Final   Special Requests NONE  Final   Sputum evaluation   Final    THIS SPECIMEN IS ACCEPTABLE FOR SPUTUM CULTURE Performed at [facility], [place] [address] [place], [place] [address]    Report Status 03/30/2018 FINAL  Final  MRSA PCR Screening     Status: Abnormal   Collection Time: 03/29/18 11:41 PM  Result Value Ref Range Status   MRSA by PCR POSITIVE (A) NEGATIVE Final    Comment:        The GeneXpert MRSA Assay (FDA approved for NASAL specimens only), is one component of a comprehensive MRSA colonization surveillance program. It is not intended to diagnose MRSA infection nor to guide or monitor treatment for MRSA infections. RESULT CALLED TO, READ BACK BY AND VERIFIED WITH: YOUSEF,M RN 03/30/18 AT 0215 SKEEN,P Performed at [facility], [place] [address] [place], [place]  [address]   Culture, respiratory     Status: None   Collection Time: 03/29/18 11:41 PM  Result Value Ref Range Status   Specimen Description SPUTUM  Final   Special Requests NONE Reflexed from [id number]  Final   Gram Stain   Final    ABUNDANT WBC PRESENT, PREDOMINANTLY PMN FEW GRAM POSITIVE COCCI IN PAIRS IN CLUSTERS Performed at [facility], [place] [address] [place], [place] [address]    Culture FEW STAPHYLOCOCCUS AUREUS  Final   Report Status 04/02/2018 FINAL  Final   Organism ID, Bacteria STAPHYLOCOCCUS AUREUS  Final      Susceptibility   Staphylococcus aureus -  MIC*    CIPROFLOXACIN <=0.5 SENSITIVE Sensitive     ERYTHROMYCIN <=0.25 SENSITIVE Sensitive     GENTAMICIN <=0.5 SENSITIVE Sensitive     OXACILLIN 0.5 SENSITIVE Sensitive     TETRACYCLINE <=1 SENSITIVE Sensitive     VANCOMYCIN <=0.5 SENSITIVE Sensitive     TRIMETH/SULFA <=10 SENSITIVE Sensitive     CLINDAMYCIN <=0.25 SENSITIVE Sensitive     RIFAMPIN <=0.5 SENSITIVE Sensitive     Inducible Clindamycin NEGATIVE Sensitive     * FEW STAPHYLOCOCCUS AUREUS  Culture, blood (routine x 2)     Status: None (Preliminary result)   Collection Time: 03/29/18 11:53 PM  Result Value Ref Range Status   Specimen Description BLOOD LEFT ANTECUBITAL  Final   Special Requests   Final    BOTTLES DRAWN AEROBIC AND ANAEROBIC Blood Culture adequate volume   Culture   Final    NO GROWTH 3 DAYS Performed at [facility], [address] [place], [place] [address]    Report Status PENDING  Incomplete  Culture, blood (routine x 2)     Status: None (Preliminary result)   Collection Time: 03/29/18 11:58 PM  Result Value Ref Range Status   Specimen Description BLOOD RIGHT HAND  Final   Special Requests   Final    BOTTLES DRAWN AEROBIC AND ANAEROBIC Blood Culture adequate volume   Culture   Final    NO GROWTH 3 DAYS Performed at [facility], [place] [address], [place], [place] [address]    Report Status PENDING  Incomplete          Radiology Studies: No results found.      Scheduled Meds: . aspirin EC  81 mg Oral Daily  . atorvastatin  40 mg Oral q1800  . Chlorhexidine Gluconate Cloth  6 each Topical Q0600  . escitalopram  10 mg Oral Daily  . fluticasone  1 spray Each Nare Daily  . guaiFENesin  600 mg Oral BID  . heparin  5,000 Units Subcutaneous Q8H  . insulin aspart  0-15 Units Subcutaneous TID WC  . insulin aspart  0-5 Units Subcutaneous QHS  . ipratropium-albuterol  3 mL Nebulization Q6H  . irbesartan  150 mg Oral Daily  . levothyroxine  75 mcg Oral QAC breakfast  . metFORMIN  1,000 mg Oral Q breakfast  . mupirocin ointment  1 application Nasal BID  . pantoprazole  40 mg Oral Daily   Continuous Infusions:   LOS: 3 days    Time spent: over 30 min    Fayrene Helper, MD [facility] Pager [phone]  If 7PM-7AM, please contact night-coverage [email or web address] Password [facility] 04/02/2018, 2:46 PM

## 2018-04-02 NOTE — Progress Notes (Signed)
Pt transferred to 4 East bed 4 per w/c with all belongings per Nurse.

## 2018-04-02 NOTE — Evaluation (Signed)
Occupational Therapy Evaluation Patient Details Name: Caitlyn Anderson MRN: [id number] DOB: [birth year] Today's Date: 04/02/2018    History of Present Illness 57 year old female with PMH of DM, Asthma, Allergic Rhinitis, GERD, HTN, Tracheal Stenosis. 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 She was struggling to complete housekeeping and meal prep due to dyspnea and L hip pain. Pt presents with decreased activity tolerance and impaired balance. She requires set up to min assist for ADL. Pt has the potential to function modified independently with intensive rehab. Will follow acutely.    Follow Up Recommendations  CIR    Equipment Recommendations  3 in 1 bedside commode;Tub/shower bench    Recommendations for Other Services       Precautions / Restrictions Precautions Precautions: Fall Precaution Comments: dyspneic with minimal exertion Restrictions Weight Bearing Restrictions: No      Mobility Bed Mobility               General bed mobility comments: pt received in chair  Transfers Overall transfer level: Needs assistance Equipment used: Rolling walker (2 wheeled) Transfers: Sit to/from Stand Sit to Stand: Supervision         General transfer comment: cues for hand placement    Balance Overall balance assessment: Needs assistance Sitting-balance support: Feet supported;No upper extremity supported Sitting balance-Leahy Scale: Good     Standing balance support: Bilateral upper extremity supported Standing balance-Leahy Scale: Poor                             ADL either performed or assessed with clinical judgement   ADL Overall ADL's : Needs assistance/impaired Eating/Feeding: Independent;Sitting   Grooming: Min guard;Wash/dry hands;Standing   Upper Body Bathing: Set  up;Sitting   Lower Body Bathing: Minimal assistance;Sit to/from stand   Upper Body Dressing : Set up;Sitting   Lower Body Dressing: Minimal assistance;Sit to/from stand   Toilet Transfer: Min guard;Ambulation;RW;BSC   Toileting- Clothing Manipulation and Hygiene: Minimal assistance;Sit to/from stand       Functional mobility during ADLs: Minimal assistance;Min guard;Rolling walker(very slow)       Vision Baseline Vision/History: Wears glasses Patient Visual Report: No change from baseline       Perception     Praxis      Pertinent Vitals/Pain Pain Assessment: Faces Faces Pain Scale: Hurts little more Pain Location: Lhip with movement Pain Descriptors / Indicators: Aching;Sore Pain Intervention(s): Monitored during session     Hand Dominance Right   Extremity/Trunk Assessment Upper Extremity Assessment Upper Extremity Assessment: Overall WFL for tasks assessed   Lower Extremity Assessment Lower Extremity Assessment: Defer to PT evaluation   Cervical / Trunk Assessment Cervical / Trunk Assessment: Other exceptions(obesity)   Communication Communication Communication: No difficulties   Cognition Arousal/Alertness: Awake/alert Behavior During Therapy: WFL for tasks assessed/performed Overall Cognitive Status: Within Functional Limits for tasks assessed                                     General Comments       Exercises     Shoulder Instructions      Home Living Family/patient expects to be discharged to:: Private residence Living Arrangements: Spouse/significant other Available Help at Discharge:  Family;Available PRN/intermittently(spouse works) Type of Home: House Home Access: Stairs to enter Technical brewer of Steps: 3 Entrance Stairs-Rails: None Home Layout: One level     Bathroom Shower/Tub: Teacher, early years/pre: Standard     Home Equipment: [employer] - single point          Prior  Functioning/Environment Level of Independence: Independent with assistive device(s)        Comments: pt using cane, avoided socks due to inability to don, increased time for donning pants, difficulty rising from toilet        OT Problem List: Decreased activity tolerance;Impaired balance (sitting and/or standing);Decreased knowledge of use of DME or AE;Cardiopulmonary status limiting activity;Pain;Obesity      OT Treatment/Interventions: Self-care/ADL training;Energy conservation;DME and/or AE instruction;Patient/family education;Balance training;Therapeutic activities    OT Goals(Current goals can be found in the care plan section) Acute Rehab OT Goals Patient Stated Goal: attend nieces wedding in 3 weeks OT Goal Formulation: With patient Time For Goal Achievement: 04/16/18 Potential to Achieve Goals: Good ADL Goals Pt Will Perform Grooming: with modified independence;standing Pt Will Perform Lower Body Bathing: with modified independence;with adaptive equipment;sit to/from stand Pt Will Perform Lower Body Dressing: with modified independence;with adaptive equipment;sit to/from stand Pt Will Transfer to Toilet: with modified independence;ambulating;bedside commode(over toilet) Pt Will Perform Toileting - Clothing Manipulation and hygiene: with modified independence;sitting/lateral leans Pt Will Perform Tub/Shower Transfer: Tub transfer;with supervision;ambulating;3 in 1;rolling walker Additional ADL Goal #1: Pt will utilize energy conservation strategies independently. Additional ADL Goal #2: Pt will gather items necessary for ADL around room modified independently.  OT Frequency: Min 2X/week   Barriers to D/C:            Co-evaluation              AM-PAC PT "6 Clicks" Daily Activity     Outcome Measure Help from another person eating meals?: None Help from another person taking care of personal grooming?: A Little Help from another person toileting, which includes  using toliet, bedpan, or urinal?: A Little Help from another person bathing (including washing, rinsing, drying)?: A Little Help from another person to put on and taking off regular upper body clothing?: None Help from another person to put on and taking off regular lower body clothing?: A Little 6 Click Score: 20   End of Session Equipment Utilized During Treatment: Gait belt;Rolling walker  Activity Tolerance: Patient limited by fatigue;Patient limited by pain Patient left: Other (comment)(walking with PT)  OT Visit Diagnosis: Unsteadiness on feet (R26.81);Other abnormalities of gait and mobility (R26.89);Pain(decreased activity)                Time: 5681-2751 OT Time Calculation (min): 22 min Charges:  OT General Charges $OT Visit: 1 Visit OT Evaluation $OT Eval Moderate Complexity: 1 Mod  Nestor Lewandowsky, OTR/L Acute Rehabilitation Services Pager: [phone] Office: [phone] Malka So 04/02/2018, 11:39 AM

## 2018-04-02 NOTE — Progress Notes (Signed)
Inpatient Rehabilitation  No decision from [facility] today. Plan to follow up with team tomorrow.  Call if questions.   Carmelia Roller., CCC/SLP Admission Coordinator  [facility]  Cell [phone]

## 2018-04-02 NOTE — Progress Notes (Signed)
      [address]       [place] [address]             [phone]         Subjective: Improved since surgery, comfortable at rest, some DOE with fairly minor activity  Objective: Vital signs in last 24 hours: Temp:  [97.6 F (36.4 C)-98.7 F (37.1 C)] 98.4 F (36.9 C) (09/25 0733) Pulse Rate:  [67-102] 75 (09/25 0600) Cardiac Rhythm: Normal sinus rhythm (09/25 0800) Resp:  [14-26] 20 (09/25 0600) BP: (107-157)/(70-86) 135/80 (09/25 0600) SpO2:  [92 %-100 %] 100 % (09/25 0814)  Hemodynamic parameters for last 24 hours:    Intake/Output from previous day: 09/24 0701 - 09/25 0700 In: 740 [P.O.:740] Out: 2500 [Urine:2500] Intake/Output this shift: No intake/output data recorded.  General appearance: alert, cooperative and no distress Heart: regular rate and rhythm Lungs: dim in bases o/w pretty clear Abdomen: soft, non-tender  Lab Results: Recent Labs    04/01/18 0551 04/02/18 0418  WBC 12.3* 12.3*  HGB 12.3 13.0  HCT 38.7 40.5  PLT 274 279   BMET:  Recent Labs    04/01/18 0551 04/02/18 0418  NA 139 136  K 3.4* 4.2  CL 102 100  CO2 30 26  GLUCOSE 135* 156*  BUN 19 17  CREATININE 0.98 0.85  CALCIUM 8.8* 8.9    PT/INR: No results for input(s): LABPROT, INR in the last 72 hours. ABG No results found for: PHART, HCO3, TCO2, ACIDBASEDEF, O2SAT CBG (last 3)  Recent Labs    04/01/18 1523 04/01/18 1736 04/01/18 2152  GLUCAP 204* 213* 237*    Meds Scheduled Meds: . aspirin EC  81 mg Oral Daily  . atorvastatin  40 mg Oral q1800  . Chlorhexidine Gluconate Cloth  6 each Topical Q0600  . escitalopram  10 mg Oral Daily  . fluticasone  1 spray Each Nare Daily  . guaiFENesin  600 mg Oral BID  . heparin  5,000 Units Subcutaneous Q8H  . Influenza vac split quadrivalent PF  0.5 mL Intramuscular Tomorrow-1000  . insulin aspart  0-15 Units Subcutaneous TID WC  . insulin aspart  0-5 Units Subcutaneous QHS  . ipratropium-albuterol  3 mL  Nebulization Q6H  . irbesartan  150 mg Oral Daily  . levothyroxine  75 mcg Oral QAC breakfast  . metFORMIN  1,000 mg Oral Q breakfast  . mupirocin ointment  1 application Nasal BID  . pantoprazole  40 mg Oral Daily  . predniSONE  50 mg Oral Q breakfast   Continuous Infusions: PRN Meds:.albuterol, hydrALAZINE, LORazepam, Racepinephrine HCl  Xrays No results found.  Assessment/Plan:  1 hemodyn stable in sinus rhythm 2 sats good on RA 3 DOE, hopefully will cont to improve with rehab   LOS: 3 days    John Giovanni 04/02/2018 Pager [phone]

## 2018-04-02 NOTE — Progress Notes (Signed)
Inpatient Rehabilitation  Met with patient, spouse, and sister at bedside to discuss team's recommendation for IP Rehab.  Shared booklets and answered questions.  Have initiated insurance authorization and plan to follow for timing of medical readiness and insurance authorization.  Call if questions.    Melissa Bowie, M.A., CCC/SLP Admission Coordinator  [facility]  Cell [phone]  

## 2018-04-02 NOTE — Progress Notes (Signed)
Physical Therapy Treatment Patient Details Name: Caitlyn Anderson MRN: [id number] DOB: [birth year] Today's Date: 04/02/2018    History of Present Illness 57 year old female with PMH of DM, Asthma, Allergic Rhinitis, GERD, HTN, Tracheal Stenosis. 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 Focus of session to ambulate with decreased anxiety due to SoB. Pt requires minA for ambulation for steadying due to L-hip pain. Pt able to ambulate 20 feet at a time before requiring standing rest breaks to recover from 4/4 DoE and associated anxiety from SoB.  Pt instructed in slowed breathing and relaxation techniques. D/c plans remain appropriate at this time.    Follow Up Recommendations  CIR     Equipment Recommendations  Other (comment)(TBD at next venue)    Recommendations for Other Services Rehab consult;OT consult     Precautions / Restrictions Precautions Precautions: Fall Restrictions Weight Bearing Restrictions: No    Mobility  Bed Mobility                  Transfers Overall transfer level: Needs assistance Equipment used: Rolling walker (2 wheeled) Transfers: Sit to/from Stand Sit to Stand: Min guard         General transfer comment: min guard for safety, decreased eccentric control during descent   Ambulation/Gait Ambulation/Gait assistance: Min assist   Assistive device: Rolling walker (2 wheeled) Gait Pattern/deviations: Decreased step length - right;Decreased stance time - left;Decreased stride length;Shuffle;Antalgic;Step-through pattern Gait velocity: slowed Gait velocity interpretation: <1.8 ft/sec, indicate of risk for recurrent falls General Gait Details: minA for steadying with RW, increased pain in L hip with ambulation, vc for use of RW to offload L hip,        Balance Overall balance assessment: Needs assistance Sitting-balance support: Feet  supported;No upper extremity supported Sitting balance-Leahy Scale: Good     Standing balance support: Bilateral upper extremity supported Standing balance-Leahy Scale: Poor                              Cognition Arousal/Alertness: Awake/alert Behavior During Therapy: WFL for tasks assessed/performed Overall Cognitive Status: Within Functional Limits for tasks assessed                                           General Comments General comments (skin integrity, edema, etc.): VSS      Pertinent Vitals/Pain Pain Assessment: Faces Pain Location: Lhip with movement Pain Descriptors / Indicators: Aching;Sore Pain Intervention(s): Limited activity within patient's tolerance;Monitored during session;Repositioned           PT Goals (current goals can now be found in the care plan section) Acute Rehab PT Goals Patient Stated Goal: attend nieces wedding in 3 weeks PT Goal Formulation: With patient Time For Goal Achievement: 04/15/18 Potential to Achieve Goals: Good Progress towards PT goals: Progressing toward goals    Frequency    Min 3X/week      PT Plan Current plan remains appropriate       AM-PAC PT "6 Clicks" Daily Activity  Outcome Measure  Difficulty turning over in bed (including adjusting bedclothes, sheets and blankets)?: A Little Difficulty moving from lying on back to sitting on the side of the bed? : A Little Difficulty sitting down on  and standing up from a chair with arms (e.g., wheelchair, bedside commode, etc,.)?: Unable Help needed moving to and from a bed to chair (including a wheelchair)?: A Little Help needed walking in hospital room?: A Little Help needed climbing 3-5 steps with a railing? : A Lot 6 Click Score: 15    End of Session Equipment Utilized During Treatment: Gait belt Activity Tolerance: Patient tolerated treatment well Patient left: in chair;with call bell/phone within reach;with chair alarm set;with  family/visitor present Nurse Communication: Mobility status PT Visit Diagnosis: Unsteadiness on feet (R26.81);Other abnormalities of gait and mobility (R26.89);Muscle weakness (generalized) (M62.81);Difficulty in walking, not elsewhere classified (R26.2);Pain Pain - Right/Left: Left Pain - part of body: Hip     Time: 6004-5997 PT Time Calculation (min) (ACUTE ONLY): 18 min  Charges:  $Gait Training: 8-22 mins                     Sladen Plancarte B. Migdalia Dk PT, DPT Acute Rehabilitation Services Pager [phone] Office [phone]    [place] 04/02/2018, 5:00 PM

## 2018-04-02 NOTE — Progress Notes (Signed)
Called report to [facility] spoke to Tie RN pt will transfer to [place] after finishes lunch per w/c Husband aware of new room change.

## 2018-04-03 ENCOUNTER — Inpatient Hospital Stay (HOSPITAL_COMMUNITY)
Admission: RE | Admit: 2018-04-03 | Discharge: 2018-04-08 | DRG: 946 | Disposition: A | Discharge status: Home Health | Payer: BLUE CROSS/BLUE SHIELD | Source: Intra-hospital | Attending: Physical Medicine & Rehabilitation | Admitting: Physical Medicine & Rehabilitation

## 2018-04-03 ENCOUNTER — Other Ambulatory Visit: Payer: Self-pay

## 2018-04-03 ENCOUNTER — Encounter (HOSPITAL_COMMUNITY): Payer: Self-pay | Admitting: *Deleted

## 2018-04-03 DIAGNOSIS — Z880 Allergy status to penicillin: Secondary | ICD-10-CM

## 2018-04-03 DIAGNOSIS — T380X5A Adverse effect of glucocorticoids and synthetic analogues, initial encounter: Secondary | ICD-10-CM | POA: Diagnosis present

## 2018-04-03 DIAGNOSIS — I447 Left bundle-branch block, unspecified: Secondary | ICD-10-CM | POA: Diagnosis present

## 2018-04-03 DIAGNOSIS — F419 Anxiety disorder, unspecified: Secondary | ICD-10-CM | POA: Diagnosis present

## 2018-04-03 DIAGNOSIS — M24552 Contracture, left hip: Secondary | ICD-10-CM | POA: Diagnosis present

## 2018-04-03 DIAGNOSIS — D72828 Other elevated white blood cell count: Secondary | ICD-10-CM | POA: Diagnosis present

## 2018-04-03 DIAGNOSIS — Z7982 Long term (current) use of aspirin: Secondary | ICD-10-CM

## 2018-04-03 DIAGNOSIS — R5381 Other malaise: Principal | ICD-10-CM | POA: Diagnosis present

## 2018-04-03 DIAGNOSIS — K5900 Constipation, unspecified: Secondary | ICD-10-CM | POA: Diagnosis not present

## 2018-04-03 DIAGNOSIS — E039 Hypothyroidism, unspecified: Secondary | ICD-10-CM | POA: Diagnosis present

## 2018-04-03 DIAGNOSIS — Z7984 Long term (current) use of oral hypoglycemic drugs: Secondary | ICD-10-CM | POA: Diagnosis not present

## 2018-04-03 DIAGNOSIS — M1612 Unilateral primary osteoarthritis, left hip: Secondary | ICD-10-CM | POA: Diagnosis present

## 2018-04-03 DIAGNOSIS — E119 Type 2 diabetes mellitus without complications: Secondary | ICD-10-CM

## 2018-04-03 DIAGNOSIS — J398 Other specified diseases of upper respiratory tract: Secondary | ICD-10-CM | POA: Diagnosis present

## 2018-04-03 DIAGNOSIS — E1165 Type 2 diabetes mellitus with hyperglycemia: Secondary | ICD-10-CM | POA: Diagnosis present

## 2018-04-03 DIAGNOSIS — R7989 Other specified abnormal findings of blood chemistry: Secondary | ICD-10-CM

## 2018-04-03 DIAGNOSIS — R0600 Dyspnea, unspecified: Secondary | ICD-10-CM | POA: Diagnosis present

## 2018-04-03 DIAGNOSIS — D72829 Elevated white blood cell count, unspecified: Secondary | ICD-10-CM

## 2018-04-03 DIAGNOSIS — Z79890 Hormone replacement therapy (postmenopausal): Secondary | ICD-10-CM

## 2018-04-03 DIAGNOSIS — K219 Gastro-esophageal reflux disease without esophagitis: Secondary | ICD-10-CM | POA: Diagnosis present

## 2018-04-03 DIAGNOSIS — J45909 Unspecified asthma, uncomplicated: Secondary | ICD-10-CM | POA: Diagnosis present

## 2018-04-03 DIAGNOSIS — R061 Stridor: Secondary | ICD-10-CM | POA: Diagnosis present

## 2018-04-03 DIAGNOSIS — E785 Hyperlipidemia, unspecified: Secondary | ICD-10-CM | POA: Diagnosis present

## 2018-04-03 DIAGNOSIS — F411 Generalized anxiety disorder: Secondary | ICD-10-CM | POA: Diagnosis not present

## 2018-04-03 DIAGNOSIS — I10 Essential (primary) hypertension: Secondary | ICD-10-CM | POA: Diagnosis present

## 2018-04-03 DIAGNOSIS — E1169 Type 2 diabetes mellitus with other specified complication: Secondary | ICD-10-CM

## 2018-04-03 DIAGNOSIS — G4700 Insomnia, unspecified: Secondary | ICD-10-CM | POA: Diagnosis present

## 2018-04-03 DIAGNOSIS — Z7951 Long term (current) use of inhaled steroids: Secondary | ICD-10-CM

## 2018-04-03 DIAGNOSIS — Z83511 Family history of glaucoma: Secondary | ICD-10-CM | POA: Diagnosis not present

## 2018-04-03 DIAGNOSIS — R509 Fever, unspecified: Secondary | ICD-10-CM | POA: Diagnosis not present

## 2018-04-03 LAB — GLUCOSE, CAPILLARY
GLUCOSE-CAPILLARY: 125 mg/dL — AB (ref 70–99)
GLUCOSE-CAPILLARY: 145 mg/dL — AB (ref 70–99)
GLUCOSE-CAPILLARY: 175 mg/dL — AB (ref 70–99)

## 2018-04-03 LAB — BASIC METABOLIC PANEL
ANION GAP: 8 (ref 5–15)
BUN: 18 mg/dL (ref 6–20)
CHLORIDE: 100 mmol/L (ref 98–111)
CO2: 27 mmol/L (ref 22–32)
CREATININE: 0.86 mg/dL (ref 0.44–1.00)
Calcium: 9.3 mg/dL (ref 8.9–10.3)
GFR calc non Af Amer: >60 mL/min (ref >60)
Glucose, Bld: 176 mg/dL — ABNORMAL HIGH (ref 70–99)
Potassium: 4.2 mmol/L (ref 3.5–5.1)
SODIUM: 135 mmol/L (ref 135–145)

## 2018-04-03 LAB — CBC
HEMATOCRIT: 39.9 % (ref 36.0–46.0)
HEMOGLOBIN: 13.1 g/dL (ref 12.0–15.0)
MCH: 31.8 pg (ref 26.0–34.0)
MCHC: 32.8 g/dL (ref 30.0–36.0)
MCV: 96.8 fL (ref 78.0–100.0)
Platelets: 302 10*3/uL (ref 150–400)
RBC: 4.12 MIL/uL (ref 3.87–5.11)
RDW: 12.5 % (ref 11.5–15.5)
WBC: 15.4 10*3/uL — ABNORMAL HIGH (ref 4.0–10.5)

## 2018-04-03 LAB — MAGNESIUM: MAGNESIUM: 2 mg/dL (ref 1.7–2.4)

## 2018-04-03 MED ORDER — PROCHLORPERAZINE MALEATE 5 MG PO TABS: 5.0000 mg | ORAL_TABLET | Freq: Four times a day (QID) | ORAL | Status: DC | PRN

## 2018-04-03 MED ORDER — INSULIN ASPART 100 UNIT/ML ~~LOC~~ SOLN
0.0000 [IU] | Freq: Three times a day (TID) | SUBCUTANEOUS | Status: DC
  Administered 2018-04-04 – 2018-04-05 (×4): 3 [IU] via SUBCUTANEOUS
  Administered 2018-04-06: 5 [IU] via SUBCUTANEOUS
  Administered 2018-04-06 – 2018-04-07 (×2): 3 [IU] via SUBCUTANEOUS
  Administered 2018-04-07: 2 [IU] via SUBCUTANEOUS

## 2018-04-03 MED ORDER — IPRATROPIUM-ALBUTEROL 0.5-2.5 (3) MG/3ML IN SOLN
3.0000 mL | Freq: Two times a day (BID) | RESPIRATORY_TRACT | Status: DC
  Administered 2018-04-03: 3 mL via RESPIRATORY_TRACT
  Filled 2018-04-03: qty 3

## 2018-04-03 MED ORDER — ASPIRIN EC 81 MG PO TBEC
81.0000 mg | DELAYED_RELEASE_TABLET | Freq: Every day | ORAL | Status: DC
  Administered 2018-04-04 – 2018-04-08 (×5): 81 mg via ORAL
  Filled 2018-04-03 (×5): qty 1

## 2018-04-03 MED ORDER — ALBUTEROL SULFATE (2.5 MG/3ML) 0.083% IN NEBU: 2.5000 mg | INHALATION_SOLUTION | RESPIRATORY_TRACT | Status: DC | PRN

## 2018-04-03 MED ORDER — IRBESARTAN 75 MG PO TABS
150.0000 mg | ORAL_TABLET | Freq: Every day | ORAL | Status: DC
  Administered 2018-04-04 – 2018-04-08 (×4): 150 mg via ORAL
  Filled 2018-04-03 (×5): qty 2

## 2018-04-03 MED ORDER — LEVOTHYROXINE SODIUM 75 MCG PO TABS
75.0000 ug | ORAL_TABLET | Freq: Every day | ORAL | Status: DC
  Administered 2018-04-04 – 2018-04-08 (×5): 75 ug via ORAL
  Filled 2018-04-03 (×5): qty 1

## 2018-04-03 MED ORDER — ESCITALOPRAM OXALATE 10 MG PO TABS
10.0000 mg | ORAL_TABLET | Freq: Every day | ORAL | Status: DC
  Administered 2018-04-04 – 2018-04-08 (×5): 10 mg via ORAL
  Filled 2018-04-03 (×5): qty 1

## 2018-04-03 MED ORDER — ACETAMINOPHEN 500 MG PO TABS: 1000.0000 mg | ORAL_TABLET | Freq: Three times a day (TID) | ORAL | 0 refills | Status: DC | PRN

## 2018-04-03 MED ORDER — PREDNISONE 20 MG PO TABS: 20.0000 mg | ORAL_TABLET | Freq: Every day | ORAL | Status: DC

## 2018-04-03 MED ORDER — PANTOPRAZOLE SODIUM 40 MG PO TBEC
40.0000 mg | DELAYED_RELEASE_TABLET | Freq: Every day | ORAL | Status: DC
  Administered 2018-04-03 – 2018-04-08 (×6): 40 mg via ORAL
  Filled 2018-04-03 (×6): qty 1

## 2018-04-03 MED ORDER — POLYETHYLENE GLYCOL 3350 17 G PO PACK
17.0000 g | PACK | Freq: Every day | ORAL | Status: DC | PRN
  Administered 2018-04-04 – 2018-04-05 (×2): 17 g via ORAL
  Filled 2018-04-03 (×2): qty 1

## 2018-04-03 MED ORDER — GUAIFENESIN ER 600 MG PO TB12
600.0000 mg | ORAL_TABLET | Freq: Two times a day (BID) | ORAL | Status: AC
  Administered 2018-04-03 – 2018-04-04 (×3): 600 mg via ORAL
  Filled 2018-04-03 (×3): qty 1

## 2018-04-03 MED ORDER — FLUTICASONE PROPIONATE 50 MCG/ACT NA SUSP
1.0000 | Freq: Every day | NASAL | Status: DC
  Administered 2018-04-04 – 2018-04-08 (×5): 1 via NASAL
  Filled 2018-04-03: qty 16

## 2018-04-03 MED ORDER — FLEET ENEMA 7-19 GM/118ML RE ENEM: 1.0000 | ENEMA | Freq: Once | RECTAL | Status: DC | PRN

## 2018-04-03 MED ORDER — INSULIN ASPART 100 UNIT/ML ~~LOC~~ SOLN: 0.0000 [IU] | Freq: Every day | SUBCUTANEOUS | Status: DC

## 2018-04-03 MED ORDER — PROCHLORPERAZINE 25 MG RE SUPP: 12.5000 mg | Freq: Four times a day (QID) | RECTAL | Status: DC | PRN

## 2018-04-03 MED ORDER — ATORVASTATIN CALCIUM 20 MG PO TABS: 20.0000 mg | ORAL_TABLET | Freq: Every day | ORAL | 0 refills | Status: DC

## 2018-04-03 MED ORDER — PREDNISONE 5 MG PO TABS: 5.0000 mg | ORAL_TABLET | Freq: Every day | ORAL | Status: DC

## 2018-04-03 MED ORDER — ALUM & MAG HYDROXIDE-SIMETH 200-200-20 MG/5ML PO SUSP: 30.0000 mL | ORAL | Status: DC | PRN

## 2018-04-03 MED ORDER — TRAZODONE HCL 50 MG PO TABS
25.0000 mg | ORAL_TABLET | Freq: Every evening | ORAL | Status: DC | PRN
  Administered 2018-04-05: 50 mg via ORAL
  Filled 2018-04-03: qty 1

## 2018-04-03 MED ORDER — IPRATROPIUM-ALBUTEROL 0.5-2.5 (3) MG/3ML IN SOLN
3.0000 mL | Freq: Two times a day (BID) | RESPIRATORY_TRACT | Status: DC
  Administered 2018-04-03 – 2018-04-06 (×6): 3 mL via RESPIRATORY_TRACT
  Filled 2018-04-03 (×6): qty 3

## 2018-04-03 MED ORDER — PREDNISONE 5 MG PO TABS: 10.0000 mg | ORAL_TABLET | Freq: Every day | ORAL | Status: DC

## 2018-04-03 MED ORDER — PREDNISONE 20 MG PO TABS: ORAL_TABLET | ORAL | 0 refills | Status: DC

## 2018-04-03 MED ORDER — LORAZEPAM 0.5 MG PO TABS: 0.5000 mg | ORAL_TABLET | Freq: Four times a day (QID) | ORAL | Status: DC | PRN

## 2018-04-03 MED ORDER — ACETAMINOPHEN 325 MG PO TABS: 325.0000 mg | ORAL_TABLET | ORAL | Status: DC | PRN

## 2018-04-03 MED ORDER — PREDNISONE 5 MG PO TABS
30.0000 mg | ORAL_TABLET | Freq: Every day | ORAL | Status: AC
  Administered 2018-04-06 – 2018-04-08 (×3): 30 mg via ORAL
  Filled 2018-04-03 (×3): qty 1

## 2018-04-03 MED ORDER — PREDNISONE 20 MG PO TABS
40.0000 mg | ORAL_TABLET | Freq: Every day | ORAL | Status: AC
  Administered 2018-04-04 – 2018-04-05 (×2): 40 mg via ORAL
  Filled 2018-04-03 (×2): qty 2

## 2018-04-03 MED ORDER — BISACODYL 10 MG RE SUPP
10.0000 mg | Freq: Every day | RECTAL | Status: DC | PRN
  Administered 2018-04-06: 10 mg via RECTAL
  Filled 2018-04-03: qty 1

## 2018-04-03 MED ORDER — ATORVASTATIN CALCIUM 40 MG PO TABS
40.0000 mg | ORAL_TABLET | Freq: Every day | ORAL | Status: DC
  Administered 2018-04-04 – 2018-04-07 (×4): 40 mg via ORAL
  Filled 2018-04-03 (×4): qty 1

## 2018-04-03 MED ORDER — ENOXAPARIN SODIUM 40 MG/0.4ML ~~LOC~~ SOLN
40.0000 mg | SUBCUTANEOUS | Status: DC
  Administered 2018-04-03 – 2018-04-07 (×5): 40 mg via SUBCUTANEOUS
  Filled 2018-04-03 (×5): qty 0.4

## 2018-04-03 MED ORDER — RACEPINEPHRINE HCL 2.25 % IN NEBU
0.5000 mL | INHALATION_SOLUTION | RESPIRATORY_TRACT | Status: DC | PRN
  Filled 2018-04-03: qty 0.5

## 2018-04-03 MED ORDER — METFORMIN HCL 500 MG PO TABS
1000.0000 mg | ORAL_TABLET | Freq: Every day | ORAL | Status: DC
  Administered 2018-04-04 – 2018-04-08 (×5): 1000 mg via ORAL
  Filled 2018-04-03 (×5): qty 2

## 2018-04-03 MED ORDER — PROCHLORPERAZINE EDISYLATE 10 MG/2ML IJ SOLN: 5.0000 mg | Freq: Four times a day (QID) | INTRAMUSCULAR | Status: DC | PRN

## 2018-04-03 MED ORDER — DIPHENHYDRAMINE HCL 12.5 MG/5ML PO ELIX: 12.5000 mg | ORAL_SOLUTION | Freq: Four times a day (QID) | ORAL | Status: DC | PRN

## 2018-04-03 MED ORDER — GUAIFENESIN-DM 100-10 MG/5ML PO SYRP: 5.0000 mL | ORAL_SOLUTION | Freq: Four times a day (QID) | ORAL | Status: DC | PRN

## 2018-04-03 NOTE — Discharge Summary (Signed)
Physician Discharge Summary  SHAWNE BULOW EPP:[id number] DOB: 06/18/1961 DOA: 03/29/2018  PCP: Dairl Ponder, MD  Admit date: 03/29/2018 Discharge date: 04/03/2018  Time spent: 35 minutes  Recommendations for Outpatient Follow-up:  1. Follow up outpatient CBC/CMP  2. Continue steroid taper, follow blood sugars and continue SSI ACHS as needed for steroid induced hyperglycemia. 3. Ensure follow up with CT surgery as outpatient 4. Patient with 4 mm RML pulmonary nodule, needs follow up outpatient per radiology recommendations (see report)  Discharge Diagnoses:  Active Problems:   Tracheal stenosis   Stridor   Discharge Condition: stable  Diet recommendation: heart healthy  Filed Weights   03/30/18 0412 03/31/18 0336 04/01/18 0500  Weight: 86.3 kg 89.2 kg 89.2 kg    History of present illness:  57 year old female witha hxof DM, Asthma, Allergic Rhinitis, GERD, HTN, andTracheal Stenosiswho [facility]/shortness of breath, productive cough, and low grade fever for 4-5 days.On9/16sheunderwent ablation ofatracheal web by Dr. Roxan Hockey. Upon arrival to EDthepatientwasnoted to haveexpiratorystridor. CT Chest withrevealedsubglottic tracheal stenosis more prominent than in previous studies.  She was admitted for stridor and worsening subglottic stenosis at the site of laser resection of tracheal web.  She was observed in the ICU and started on steroids.  She gradually improved and was discharged to inpatient rehab on steroid taper with CT surgery to continue to follow.  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  Continued stridor today, relatively stable from yesterday. On slow steroid taper over next few weeks CT surgery notes ok for d/c to rehab today Will need outpatient follow up with CT surgery  HTN BP reasonably controlled at this time,  stable.  Follow.  Mild hypokalemia  Improved, follow  Asthma Quiescent presently, no wheezing, but stridor present  DM CBG reasonably well controlled at this time - stable, follow  GERD ContProtonix  Anxiety  PRN ativan  Leukocytosis: mild, 2/2 steroids, follow  MSSA positive sputum, but no si/sx of pneumonia, afebrile follow  MRSA screen +  Procedures: 9/16  Flexible fiberoptic bronchoscopy with laser resection of tracheal web.  Consultations:  PCCM  CT surgery  Discharge Exam: Vitals:   04/03/18 0843 04/03/18 0845  BP:  (!) 150/81  Pulse:  78  Resp:  18  Temp:  97.7 F (36.5 C)  SpO2: 96% 100%   Doing well.  Asking when she'll be discharged.  General: No acute distress. Cardiovascular: Heart sounds show a regular rate, and rhythm Lungs: Clear to auscultation bilaterally  Abdomen: Soft, nontender, nondistended Neurological: Alert and oriented 3. Moves all extremities 4. Cranial nerves II through XII grossly intact. Skin: Warm and dry. No rashes or lesions. Extremities: No clubbing or cyanosis. No edema Psychiatric: Mood and affect are normal. Insight and judgment are appropriate.  Discharge Instructions   Discharge Instructions    Call MD for:  difficulty breathing, headache or visual disturbances   Complete by:  As directed    Call MD for:  extreme fatigue   Complete by:  As directed    Call MD for:  persistant dizziness or light-headedness   Complete by:  As directed    Call MD for:  persistant nausea and vomiting   Complete by:  As directed    Call MD for:  redness, tenderness, or signs of infection (pain, swelling, redness, odor or green/yellow discharge around incision site)   Complete by:  As directed  Call MD for:  severe uncontrolled pain   Complete by:  As directed    Call MD for:  temperature >100.4   Complete by:  As directed    Diet - low sodium heart healthy   Complete by:  As directed    Discharge instructions    Complete by:  As directed    You were seen for stridor.  You improved with steroids.  We will discharge you with a slow steroid taper.  CT surgery will continue to follow you at rehab.  Return for new, recurrent, or worsening symptoms.  Please ask your PCP to request records from this hospitalization so they know what was done and what the next steps will be.   Increase activity slowly   Complete by:  As directed      Allergies as of 04/03/2018      Reactions   Penicillins Hives, Rash   Has patient had a PCN reaction causing immediate rash, facial/tongue/throat swelling, SOB or lightheadedness with hypotension: No Has patient had a PCN reaction causing severe rash involving mucus membranes or skin necrosis: No Has patient had a PCN reaction that required hospitalization: No Has patient had a PCN reaction occurring within the last 10 years: No If all of the above answers are "NO", then may proceed with Cephalosporin use.      Medication List    TAKE these medications   acetaminophen 500 MG tablet Commonly known as:  TYLENOL Take 2 tablets (1,000 mg total) by mouth every 8 (eight) hours as needed for mild pain. What changed:  when to take this   ALPRAZolam 0.25 MG tablet Commonly known as:  XANAX Take 0.25 mg by mouth daily as needed for anxiety.   aspirin 81 MG tablet Take 81 mg by mouth daily.   atorvastatin 20 MG tablet Commonly known as:  LIPITOR Take 1 tablet (20 mg total) by mouth daily. What changed:  how much to take   cetirizine 10 MG tablet Commonly known as:  ZYRTEC Take 10 mg by mouth at bedtime.   Cholecalciferol 2000 units Tabs Take 2,000 Units by mouth daily.   escitalopram 10 MG tablet Commonly known as:  LEXAPRO Take 10 mg by mouth daily.   esomeprazole 20 MG capsule Commonly known as:  NEXIUM Take 40 mg by mouth daily at 12 noon.   fluticasone 50 MCG/ACT nasal spray Commonly known as:  FLONASE Inhale 1 spray into the lungs daily.    Hypromellose 0.3 % Soln Place 1 application into both eyes at bedtime.   levothyroxine 75 MCG tablet Commonly known as:  SYNTHROID, LEVOTHROID Take 75 mcg by mouth daily before breakfast.   metFORMIN 1000 MG tablet Commonly known as:  GLUCOPHAGE Take 1,000 mg by mouth daily.   predniSONE 20 MG tablet Commonly known as:  DELTASONE Take 2 tablets (40 mg total) by mouth daily with breakfast for 5 days, THEN 1.5 tablets (30 mg total) daily with breakfast for 5 days, THEN 1 tablet (20 mg total) daily with breakfast for 5 days, THEN 0.5 tablets (10 mg total) daily with breakfast for 5 days. Start taking on:  04/04/2018   PROAIR HFA 108 (90 Base) MCG/ACT inhaler Generic drug:  albuterol Inhale 2 puffs into the lungs 4 (four) times daily as needed for wheezing or shortness of breath.   valsartan 160 MG tablet Commonly known as:  DIOVAN Take 1 tablet (160 mg total) by mouth daily.      Allergies  Allergen Reactions  .  Penicillins Hives and Rash    Has patient had a PCN reaction causing immediate rash, facial/tongue/throat swelling, SOB or lightheadedness with hypotension: No Has patient had a PCN reaction causing severe rash involving mucus membranes or skin necrosis: No Has patient had a PCN reaction that required hospitalization: No Has patient had a PCN reaction occurring within the last 10 years: No If all of the above answers are "NO", then may proceed with Cephalosporin use.       The results of significant diagnostics from this hospitalization (including imaging, microbiology, ancillary and laboratory) are listed below for reference.    Significant Diagnostic Studies: Dg Chest 2 View  Result Date: 03/29/2018 CLINICAL DATA:  Cough and shortness of breath. Fever. EXAM: CHEST - 2 VIEW COMPARISON:  03/24/2018. FINDINGS: Normal sized heart. Clear lungs. Thoracic spine degenerative changes. IMPRESSION: No acute abnormality. Electronically Signed   By: Claudie Revering M.D.   On:  03/29/2018 18:27   Dg Chest 2 View  Result Date: 03/24/2018 CLINICAL DATA:  Preoperative bronchoscopy EXAM: CHEST - 2 VIEW COMPARISON:  Chest CT February 19, 2018 FINDINGS: Lungs are clear. Heart size and pulmonary vascularity are normal. No adenopathy. No tracheal lesions are appreciable by radiography. There is degenerative change in the thoracic spine. There is mild midthoracic levoscoliosis. IMPRESSION: No edema or consolidation. Electronically Signed   By: Lowella Grip III M.D.   On: 03/24/2018 10:22   Ct Soft Tissue Neck W Contrast  Result Date: 03/29/2018 CLINICAL DATA:  Shortness of breath, productive cough, fever, sore throat, and stridor for 5 days, recent surgery for tracheal blockage. EXAM: CT NECK WITH CONTRAST TECHNIQUE: Multidetector CT imaging of the neck was performed using the standard protocol following the bolus administration of intravenous contrast. CONTRAST:  141m ISOVUE-300 IOPAMIDOL (ISOVUE-300) INJECTION 61% COMPARISON:  CT neck 02/19/2018. FINDINGS: Pharynx and larynx: Since the previous study, the patient has undergone laser resection of a tracheal web. 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 This is likely due to postoperative edema, as there is no focal hematoma. Using electronic measurements, the region of maximal narrowing corresponds to an area of 0.2 cm squared which appears significantly worse than the preoperative glottic cross-section, and is also significantly more narrowed than the glottic area of 1.5 cm squared more inferiorly. Salivary glands: No inflammation, mass, or stone. Thyroid: Normal. Lymph nodes: None enlarged or abnormal density. Vascular: Atheromatous change RIGHT carotid bifurcation Limited intracranial: Negative Visualized orbits: Negative Mastoids and visualized paranasal sinuses: No significant fluid accumulation Skeleton: Spondylosis, described previously. Upper chest: Reported separately Other: None IMPRESSION: Circumferential  subglottic stenosis at the site of laser resection of a tracheal web, with an approximate area of 0.2 cm squared, significantly worse than the preoperative appearance. This correlates with the clinical findings of severe stridor. These results were called by telephone at the time of interpretation on 03/29/2018 at 7:57 pm to [facility]. [facility] verbally acknowledged these results. Electronically Signed   By: JStaci RighterM.D.   On: 03/29/2018 19:58   Ct Chest W Contrast  Result Date: 03/29/2018 CLINICAL DATA:  Productive cough and fever.  Shortness of breath. EXAM: CT CHEST WITH CONTRAST TECHNIQUE: Multidetector CT imaging of the chest was performed during intravenous contrast administration. CONTRAST:  1074mISOVUE-300 IOPAMIDOL (ISOVUE-300) INJECTION 61% COMPARISON:  No comparison studies available. FINDINGS: Cardiovascular: The heart size is normal. No substantial pericardial effusion. Mediastinum/Nodes: No mediastinal lymphadenopathy. There is no hilar lymphadenopathy. The esophagus has normal imaging features. There is no axillary lymphadenopathy. Lungs/Pleura:  The central tracheobronchial airways are patent. Subglottic trachea appears narrowed at 4 mm (image 14/series 4). This is progressive comparing to the study from about a month ago. 4 mm right middle lobe pulmonary nodule is unchanged. Lungs otherwise normal. Upper Abdomen: The liver shows diffusely decreased attenuation suggesting steatosis. Musculoskeletal: No worrisome lytic or sclerotic osseous abnormality. IMPRESSION: 1. Subglottic tracheal stenosis appears more prominent today than on prior study. Patient underwent laser resection of tracheal web in this region on 03/24/2018. 2. 4 mm right middle lobe pulmonary nodule. No follow-up needed if patient is low-risk. Non-contrast chest CT can be considered in 12 months if patient is high-risk. This recommendation follows the consensus statement: Guidelines for Management of Incidental Pulmonary Nodules  Detected on CT Images: From the Fleischner Society 2017; Radiology 2017; 284:228-243. Electronically Signed   By: Misty Stanley M.D.   On: 03/29/2018 19:42    Microbiology: Recent Results (from the past 240 hour(s))  Expectorated sputum assessment w rflx to resp cult     Status: None   Collection Time: 03/29/18 11:41 PM  Result Value Ref Range Status   Specimen Description SPUTUM  Final   Special Requests NONE  Final   Sputum evaluation   Final    THIS SPECIMEN IS ACCEPTABLE FOR SPUTUM CULTURE Performed at [facility], [address], [place], [place] [address]    Report Status 03/30/2018 FINAL  Final  MRSA PCR Screening     Status: Abnormal   Collection Time: 03/29/18 11:41 PM  Result Value Ref Range Status   MRSA by PCR POSITIVE (A) NEGATIVE Final    Comment:        The GeneXpert MRSA Assay (FDA approved for NASAL specimens only), is one component of a comprehensive MRSA colonization surveillance program. It is not intended to diagnose MRSA infection nor to guide or monitor treatment for MRSA infections. RESULT CALLED TO, READ BACK BY AND VERIFIED WITH: YOUSEF,M RN 03/30/18 AT 0215 SKEEN,P Performed at [facility], [place] [address], [place], [place] [address]   Culture, respiratory     Status: None   Collection Time: 03/29/18 11:41 PM  Result Value Ref Range Status   Specimen Description SPUTUM  Final   Special Requests NONE Reflexed from [id number]  Final   Gram Stain   Final    ABUNDANT WBC PRESENT, PREDOMINANTLY PMN FEW GRAM POSITIVE COCCI IN PAIRS IN CLUSTERS Performed at [facility], [place] [address], [place], [place] [address]    Culture FEW STAPHYLOCOCCUS AUREUS  Final   Report Status 04/02/2018 FINAL  Final   Organism ID, Bacteria STAPHYLOCOCCUS AUREUS  Final      Susceptibility   Staphylococcus aureus - MIC*    CIPROFLOXACIN <=0.5 SENSITIVE Sensitive     ERYTHROMYCIN <=0.25 SENSITIVE Sensitive     GENTAMICIN <=0.5 SENSITIVE Sensitive      OXACILLIN 0.5 SENSITIVE Sensitive     TETRACYCLINE <=1 SENSITIVE Sensitive     VANCOMYCIN <=0.5 SENSITIVE Sensitive     TRIMETH/SULFA <=10 SENSITIVE Sensitive     CLINDAMYCIN <=0.25 SENSITIVE Sensitive     RIFAMPIN <=0.5 SENSITIVE Sensitive     Inducible Clindamycin NEGATIVE Sensitive     * FEW STAPHYLOCOCCUS AUREUS  Culture, blood (routine x 2)     Status: None (Preliminary result)   Collection Time: 03/29/18 11:53 PM  Result Value Ref Range Status   Specimen Description BLOOD LEFT ANTECUBITAL  Final   Special Requests   Final    BOTTLES  DRAWN AEROBIC AND ANAEROBIC Blood Culture adequate volume   Culture   Final    NO GROWTH 4 DAYS Performed at [facility], [place] [address], [place], [place] [address]    Report Status PENDING  Incomplete  Culture, blood (routine x 2)     Status: None (Preliminary result)   Collection Time: 03/29/18 11:58 PM  Result Value Ref Range Status   Specimen Description BLOOD RIGHT HAND  Final   Special Requests   Final    BOTTLES DRAWN AEROBIC AND ANAEROBIC Blood Culture adequate volume   Culture   Final    NO GROWTH 4 DAYS Performed at [facility], [place] [address], [place], [place] [address]    Report Status PENDING  Incomplete     Labs: Basic Metabolic Panel: Recent Labs  Lab 03/29/18 1706 03/29/18 2352 03/30/18 0313 04/01/18 0551 04/02/18 0418 04/03/18 0313  NA 138  --  137 139 136 135  K 3.7  --  4.8 3.4* 4.2 4.2  CL 101  --  102 102 100 100  CO2 28  --  _0 GLUCOSE 143*  --  198* 135* 156* 176*  BUN 10  --  _1 CREATININE 0.75  --  0.82 0.98 0.85 0.86  CALCIUM 9.2  --  9.2 8.8* 8.9 9.3  MG  --  2.0 2.1 2.2 2.1 2.0  PHOS  --   --  2.9 3.6  --   --    Liver Function Tests: Recent Labs  Lab 04/02/18 0418  AST 16  ALT 19  ALKPHOS 63  BILITOT 0.6  PROT 6.3*  ALBUMIN 3.4*   No results for input(s): LIPASE, AMYLASE in the last 168 hours. No results for input(s): AMMONIA in the last 168  hours. CBC: Recent Labs  Lab 03/29/18 1706 03/30/18 0313 04/01/18 0551 04/02/18 0418 04/03/18 0313  WBC 13.0* 12.8* 12.3* 12.3* 15.4*  NEUTROABS 9.6*  --   --   --   --   HGB 14.0 14.2 12.3 13.0 13.1  HCT 41.0 43.0 38.7 40.5 39.9  MCV 95.8 96.4 99.7 97.6 96.8  PLT 265 283 274 279 302   Cardiac Enzymes: Recent Labs  Lab 03/29/18 1845  TROPONINI <0.03   BNP: BNP (last 3 results) No results for input(s): BNP in the last 8760 hours.  ProBNP (last 3 results) No results for input(s): PROBNP in the last 8760 hours.  CBG: Recent Labs  Lab 04/02/18 1443 04/02/18 1654 04/02/18 2232 04/03/18 0613 04/03/18 1122  GLUCAP 161* 219* 226* 125* 145*       Signed:  Fayrene Helper MD.  [facility] 04/03/2018, 3:58 PM

## 2018-04-03 NOTE — H&P (Signed)
Physical Medicine and Rehabilitation Admission H&P        Chief Complaint  Patient presents with  .  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  She was treated with  Solu-Medrol and nebs with improvement in respiratory status.  PCCM following for input and has signed off as respiratory status has improved.  Therapy evaluations done with patient limited by left hip pain with unsteady gait as well as shortness of breath.  CIR recommended due to functional deficits      Review of Systems  Constitutional: Negative for chills and fever.  HENT: Negative for hearing loss and tinnitus.   Eyes: Negative for blurred vision and double vision.  Respiratory: Positive for shortness of breath and wheezing. Negative for cough.   Cardiovascular: Negative for chest pain and palpitations.  Gastrointestinal: Positive for heartburn.  Genitourinary: Negative for dysuria and urgency.  Musculoskeletal: Positive for joint pain (left hip pain --better with steriods. ). Negative for myalgias.  Skin: Negative for rash.  Neurological: Negative for dizziness and headaches.  Psychiatric/Behavioral: The patient is nervous/anxious (feels a little jittery. ) and has insomnia.             Past Medical History:  Diagnosis Date  . Allergic rhinitis    . Asthma    . Diabetes mellitus without complication (HCC)      Type ii  . Dyspnea    . Family history of adverse reaction to anesthesia    . GERD (gastroesophageal reflux disease)    . Hyperlipidemia    . Hypertension    . Hypothyroidism    . Left bundle branch block             Past  Surgical History:  Procedure Laterality Date  . BREAST SURGERY Left      lumpectomy  . CESAREAN SECTION      . COLONOSCOPY      . DILATION AND CURETTAGE OF UTERUS      . FLEXIBLE BRONCHOSCOPY N/A 03/24/2018    Procedure: FLEXIBLE BRONCHOSCOPY;  Surgeon: Melrose Nakayama, MD;  Location: [facility];  Service: Thoracic;  Laterality: N/A;  . LASER BRONCHOSCOPY N/A 03/24/2018    Procedure: LASER BRONCHOSCOPY FOR RESECTION OF TRACHEAL WEB;  Surgeon: Melrose Nakayama, MD;  Location: [facility] OR;  Service: Thoracic;  Laterality: N/A;  . TONSILLECTOMY      . VIDEO BRONCHOSCOPY Bilateral 02/03/2018    Procedure: VIDEO BRONCHOSCOPY WITHOUT FLUORO;  Surgeon: Collene Gobble, MD;  Location: [facility] ENDOSCOPY;  Service: Cardiopulmonary;  Laterality: Bilateral;           Family History  Problem Relation Age of Onset  . Glaucoma Father    . Macular degeneration Maternal Grandmother        Social History: Married.  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  Independent but limited by shortness of breath for past few months.  She reports that she has never smoked. She has never used smokeless tobacco. She reports that she drank alcohol. She reports that she has current or past drug history.  Allergies  Allergen Reactions  . Penicillins Hives and Rash      Has patient had a PCN reaction causing immediate rash, facial/tongue/throat swelling, SOB or lightheadedness with hypotension: No Has patient had a PCN reaction causing severe rash involving mucus membranes or skin necrosis: No Has patient had a PCN reaction that required hospitalization: No Has patient had a PCN reaction occurring within the last 10 years: No If all of the above answers are "NO", then may proceed with Cephalosporin use.              Medications Prior to Admission  Medication Sig Dispense Refill  . albuterol (PROAIR HFA) 108 (90 Base) MCG/ACT inhaler Inhale 2 puffs into the lungs 4 (four) times daily as needed for  wheezing or shortness of breath.       . ALPRAZolam (XANAX) 0.25 MG tablet Take 0.25 mg by mouth daily as needed for anxiety.      Marland Kitchen aspirin 81 MG tablet Take 81 mg by mouth daily.       Marland Kitchen atorvastatin (LIPITOR) 20 MG tablet Take 20 tablets by mouth daily.       . cetirizine (ZYRTEC) 10 MG tablet Take 10 mg by mouth at bedtime.       . Cholecalciferol 2000 units TABS Take 2,000 Units by mouth daily.       Marland Kitchen escitalopram (LEXAPRO) 10 MG tablet Take 10 mg by mouth daily.       Marland Kitchen esomeprazole (NEXIUM) 20 MG capsule Take 40 mg by mouth daily at 12 noon.      . fluticasone (FLONASE) 50 MCG/ACT nasal spray Inhale 1 spray into the lungs daily.      . Hypromellose 0.3 % SOLN Place 1 application into both eyes at bedtime.      Marland Kitchen levothyroxine (SYNTHROID, LEVOTHROID) 75 MCG tablet Take 75 mcg by mouth daily before breakfast.      . metFORMIN (GLUCOPHAGE) 1000 MG tablet Take 1,000 mg by mouth daily.       . valsartan (DIOVAN) 160 MG tablet Take 1 tablet (160 mg total) by mouth daily. 31 tablet 4  . acetaminophen (TYLENOL) 500 MG tablet Take 1,000 mg by mouth every 6 (six) hours as needed for mild pain.          Drug Regimen Review  Drug regimen was reviewed and remains appropriate with no significant issues identified   Home: Home Living Family/patient expects to be discharged to:: Private residence Living Arrangements: Spouse/significant other Available Help at Discharge: Family, Available PRN/intermittently(spouse works) Type of Home: House Home Access: Stairs to enter Technical brewer of Steps: 3 Entrance Stairs-Rails: None Home Layout: One level Bathroom Shower/Tub: Chiropodist: Standard Bathroom Accessibility: Yes Home Equipment: Cane - single point   Functional History: Prior Function Level of Independence: Independent with assistive device(s) Comments: pt using cane, avoided socks due to inability to don, increased time for donning pants, difficulty rising  from toilet   Functional Status:  Mobility: Bed Mobility Overal bed mobility: Modified Independent General bed mobility comments: pt received in chair Transfers Overall transfer level: Needs assistance Equipment used: Rolling walker (2 wheeled) Transfers: Sit to/from Stand Sit to Stand: Supervision General transfer comment: cues for hand placement Ambulation/Gait Ambulation/Gait assistance: Min assist Gait Distance (Feet): 35 Feet Assistive device: Rolling walker (2 wheeled) Gait Pattern/deviations: Decreased step length - right, Decreased stance time - left, Decreased stride length, Shuffle, Antalgic, Step-through pattern General Gait Details: minA for steadying with RW, increased pain  in L hip with ambulation, vc for use of RW to offload L hip,  Gait velocity: slowed Gait velocity interpretation: <1.8 ft/sec, indicate of risk for recurrent falls   ADL: ADL Overall ADL's : Needs assistance/impaired Eating/Feeding: Independent, Sitting Grooming: Min guard, Wash/dry hands, Standing Upper Body Bathing: Set up, Sitting Lower Body Bathing: Minimal assistance, Sit to/from stand Upper Body Dressing : Set up, Sitting Lower Body Dressing: Minimal assistance, Sit to/from stand Toilet Transfer: Min guard, Ambulation, RW, BSC Toileting- Clothing Manipulation and Hygiene: Minimal assistance, Sit to/from stand Functional mobility during ADLs: Minimal assistance, Min guard, Rolling walker(very slow)   Cognition: Cognition Overall Cognitive Status: Within Functional Limits for tasks assessed Orientation Level: Oriented X4 Cognition Arousal/Alertness: Awake/alert Behavior During Therapy: WFL for tasks assessed/performed Overall Cognitive Status: Within Functional Limits for tasks assessed     Blood pressure (!) 154/108, pulse 85, temperature 98.4 F (36.9 C), temperature source Oral, resp. rate 14, height 5' (1.524 m), weight 89.2 kg, SpO2 97 %. Physical Exam  Nursing note and vitals  reviewed. Constitutional: She is oriented to person, place, and time.  Neurological: She is alert and oriented to person, place, and time.  Speech clear. Able to follow commands without difficulty.    Physical Exam  Constitutional: She is oriented to person, place, and time and well-developed, well-nourished, and in no distress. No distress.  HENT:  Head: Normocephalic and atraumatic.  Eyes: Pupils are equal, round, and reactive to light. Conjunctivae and EOM are normal.  Neck: Normal range of motion.  Cardiovascular: Normal rate, regular rhythm and normal heart sounds. Exam reveals no friction rub.  No murmur heard. Pulmonary/Chest: Effort normal. No respiratory distress. She has no wheezes.  Breath sounds slightly diminished  Abdominal: Soft. Bowel sounds are normal. She exhibits no distension. There is no tenderness.  Musculoskeletal:  Left hip internal/external rotation accompanied by pain  Neurological: She is alert and oriented to person, place, and time.  Strength is 5/5 bilateral deltoid bicep tricep grip hip flexor knee extensor ankle dorsiflexor  Station intact light touch bilateral upper and lower limb  Skin: Skin is warm and dry. She is not diaphoretic.  Psychiatric: Affect normal.  Nursing note and vitals reviewed.    Lab Results Last 48 Hours        Results for orders placed or performed during the hospital encounter of 03/29/18 (from the past 48 hour(s))  Glucose, capillary     Status: Abnormal    Collection Time: 03/31/18  3:18 PM  Result Value Ref Range    Glucose-Capillary 181 (H) 70 - 99 mg/dL  Glucose, capillary     Status: Abnormal    Collection Time: 03/31/18  7:39 PM  Result Value Ref Range    Glucose-Capillary 171 (H) 70 - 99 mg/dL  Glucose, capillary     Status: Abnormal    Collection Time: 04/01/18 12:18 AM  Result Value Ref Range    Glucose-Capillary 165 (H) 70 - 99 mg/dL  Glucose, capillary     Status: Abnormal    Collection Time: 04/01/18  3:35  AM  Result Value Ref Range    Glucose-Capillary 146 (H) 70 - 99 mg/dL  CBC     Status: Abnormal    Collection Time: 04/01/18  5:51 AM  Result Value Ref Range    WBC 12.3 (H) 4.0 - 10.5 K/uL    RBC 3.88 3.87 - 5.11 MIL/uL    Hemoglobin 12.3 12.0 - 15.0 g/dL    HCT 38.7 36.0 -  46.0 %    MCV 99.7 78.0 - 100.0 fL    MCH 31.7 26.0 - 34.0 pg    MCHC 31.8 30.0 - 36.0 g/dL    RDW 12.9 11.5 - 15.5 %    Platelets 274 150 - 400 K/uL      Comment: Performed at [facility], [place] [address] [place], [place] [address]  Basic metabolic panel     Status: Abnormal    Collection Time: 04/01/18  5:51 AM  Result Value Ref Range    Sodium 139 135 - 145 mmol/L    Potassium 3.4 (L) 3.5 - 5.1 mmol/L    Chloride 102 98 - 111 mmol/L    CO2 30 22 - 32 mmol/L    Glucose, Bld 135 (H) 70 - 99 mg/dL    BUN 19 6 - 20 mg/dL    Creatinine, Ser 0.98 0.44 - 1.00 mg/dL    Calcium 8.8 (L) 8.9 - 10.3 mg/dL    GFR calc non Af Amer >60 >60 mL/min    GFR calc Af Amer >60 >60 mL/min      Comment: (NOTE) The eGFR has been calculated using the CKD EPI equation. This calculation has not been validated in all clinical situations. eGFR's persistently <60 mL/min signify possible Chronic Kidney Disease.      Anion gap 7 5 - 15      Comment: Performed at [facility] [address], [place], [place] [address]  Magnesium     Status: None    Collection Time: 04/01/18  5:51 AM  Result Value Ref Range    Magnesium 2.2 1.7 - 2.4 mg/dL      Comment: Performed at [facility] [address] [place], [place] [address]  Phosphorus     Status: None    Collection Time: 04/01/18  5:51 AM  Result Value Ref Range    Phosphorus 3.6 2.5 - 4.6 mg/dL      Comment: Performed at [facility] [address] [place], [place] [address]  Glucose, capillary     Status: Abnormal    Collection Time: 04/01/18  7:34 AM  Result Value Ref Range    Glucose-Capillary 116 (H) 70 - 99 mg/dL  Glucose, capillary     Status:  Abnormal    Collection Time: 04/01/18 11:22 AM  Result Value Ref Range    Glucose-Capillary 120 (H) 70 - 99 mg/dL  Glucose, capillary     Status: Abnormal    Collection Time: 04/01/18  3:23 PM  Result Value Ref Range    Glucose-Capillary 204 (H) 70 - 99 mg/dL  Glucose, capillary     Status: Abnormal    Collection Time: 04/01/18  5:36 PM  Result Value Ref Range    Glucose-Capillary 213 (H) 70 - 99 mg/dL  Glucose, capillary     Status: Abnormal    Collection Time: 04/01/18  9:52 PM  Result Value Ref Range    Glucose-Capillary 237 (H) 70 - 99 mg/dL  Magnesium     Status: None    Collection Time: 04/02/18  4:18 AM  Result Value Ref Range    Magnesium 2.1 1.7 - 2.4 mg/dL      Comment: Performed at [facility], [place] [address] [place], [place] [address]  Comprehensive metabolic panel     Status: Abnormal    Collection Time: 04/02/18  4:18 AM  Result Value Ref Range    Sodium 136 135 -  145 mmol/L    Potassium 4.2 3.5 - 5.1 mmol/L      Comment: NO VISIBLE HEMOLYSIS    Chloride 100 98 - 111 mmol/L    CO2 26 22 - 32 mmol/L    Glucose, Bld 156 (H) 70 - 99 mg/dL    BUN 17 6 - 20 mg/dL    Creatinine, Ser 0.85 0.44 - 1.00 mg/dL    Calcium 8.9 8.9 - 10.3 mg/dL    Total Protein 6.3 (L) 6.5 - 8.1 g/dL    Albumin 3.4 (L) 3.5 - 5.0 g/dL    AST 16 15 - 41 U/L    ALT 19 0 - 44 U/L    Alkaline Phosphatase 63 38 - 126 U/L    Total Bilirubin 0.6 0.3 - 1.2 mg/dL    GFR calc non Af Amer >60 >60 mL/min    GFR calc Af Amer >60 >60 mL/min      Comment: (NOTE) The eGFR has been calculated using the CKD EPI equation. This calculation has not been validated in all clinical situations. eGFR's persistently <60 mL/min signify possible Chronic Kidney Disease.      Anion gap 10 5 - 15      Comment: Performed at [facility] [address], [place], [place] [address]  CBC     Status: Abnormal    Collection Time: 04/02/18  4:18 AM  Result Value Ref Range    WBC 12.3 (H) 4.0 - 10.5 K/uL     RBC 4.15 3.87 - 5.11 MIL/uL    Hemoglobin 13.0 12.0 - 15.0 g/dL    HCT 40.5 36.0 - 46.0 %    MCV 97.6 78.0 - 100.0 fL    MCH 31.3 26.0 - 34.0 pg    MCHC 32.1 30.0 - 36.0 g/dL    RDW 12.5 11.5 - 15.5 %    Platelets 279 150 - 400 K/uL      Comment: Performed at [facility], [place] [address], [place], [place] [address]  Glucose, capillary     Status: Abnormal    Collection Time: 04/02/18 10:29 AM  Result Value Ref Range    Glucose-Capillary 135 (H) 70 - 99 mg/dL      Imaging Results (Last 48 hours)  No results found.           Medical Problem List and Plan: 1.  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 2.  DVT Prophylaxis/Anticoagulation: Pharmaceutical: Lovenox 3. Endstage OA left hip/Pain Management:  Schedule tylenol. Will add Aspercreme/Ice prn 4. Mood: LCSW to follow for evaluation and support.   5. Neuropsych: This patient is capable of making decisions on her own behalf. 6. Skin/Wound Care: Routine pressure relief measures 7. Fluids/Electrolytes/Nutrition: Monitor I's and O's.  Check BMP in a.m. 8. T2DM: Monitor blood sugars AC at bedtime.  Continue metformin daily with sliding scale insulin for elevated blood sugars. 9. HTN: Monitor blood pressures twice daily.  Continue Avapro daily 10 GERD: Managed with Protonix 11. Asthma with DOE/trachel stenosis: Continue duo nebs 4 times daily. Slow steroid taper recommended.  12.  History of depression/anxiety: Continue Lexapro.  Continue Ativan as needed for increased anxiety likely due to steroids.  13. Leucocytosis: Likely due to steroids--monitor for signs of infection.   Post Admission Physician Evaluation: 1. Functional deficits secondary  to Debility, tracheal stenosis and end stage Left hip OA. 2. Patient admitted to receive collaborative, interdisciplinary care between the physiatrist, rehab nursing staff, and  therapy team. 3. Patient's level of medical complexity and substantial therapy needs  in context of that medical necessity cannot be provided at a lesser intensity of care. 4. Patient has experienced substantial functional loss from his/her baseline.Judging by the patient's diagnosis, physical exam, and functional history, the patient has potential for functional progress which will result in measurable gains while on inpatient rehab.  These gains will be of substantial and practical use upon discharge in facilitating mobility and self-care at the household level. 5. Physiatrist will provide 24 hour management of medical needs as well as oversight of the therapy plan/treatment and provide guidance as appropriate regarding the interaction of the two. 6. 24 hour rehab nursing will assist in the management of  bladder management, bowel management, safety, skin/wound care, disease management, medication administration, pain management and patient education  and help integrate therapy concepts, techniques,education, etc. 7. PT will assess and treat for: DME instruction, Gait training, Stair training, Functional mobility training, Therapeutic activities, Therapeutic exercise, Balance training, Patient/family education.  Goals are: independent with assistive device. 8. OT will assess and treat for Self-care/ADL training, Energy conservation, DME and/or AE instruction, Patient/family education, Balance training, Therapeutic activities.  Goals are: independent with assistive device.  9. SLP will assess and treat for  .  Goals are: N/A. 10. Case Management and Social Worker will assess and treat for psychological issues and discharge planning. 11. Team conference will be held weekly to assess progress toward goals and to determine barriers to discharge. 12.  Patient will receive at least 3 hours of therapy per day at least 5 days per week. 13. ELOS and Prognosis: 7d excellent      "I have personally performed a face to face diagnostic evaluation of this patient.  Additionally, I have reviewed  and concur with the physician assistant's documentation above."  Charlett Blake M.D. [facility] FAAPM&R (Sports Med, Neuromuscular Med) Diplomate [place] Board of [place], PA-C 04/02/2018

## 2018-04-03 NOTE — Progress Notes (Signed)
Received report from RN. Patient stable; VSS; Pain 0/10; A/Ox4; Patient cane, walker and call bell near. Will continue to monitor

## 2018-04-03 NOTE — Progress Notes (Addendum)
Inpatient Rehabilitation  I have insurance approval for an IP Rehab admission and have a bed to offer today.  I have notified patient and attending MD, and await final medical clearance.  Plan to update the team when I have a decision.  Call if questions.   Update: I have medical approval.  Patient and spouse in agreement with plan will arrange for admission today and have updated team.   Gunnar Fusi, M.A., CCC/SLP Admission [place]  Cell [phone]

## 2018-04-03 NOTE — Care Management Note (Signed)
Case Management Note Caitlyn Gibbons RN, BSN Unit 4E- RN Care Coordinator  [phone]  Patient Details  Name: Caitlyn Anderson MRN: [id number] Date of Birth: 01-20-1961  Subjective/Objective:   Pt admitted worsening strider s/p tracheal web resection                  Action/Plan: PTA pt lived at home with spouse, per PT/OT evals recommendations for CIR, consult has been placed and Melissa with CIR is following for medical readiness and insurance approval for admission to [facility] IP rehab.   Expected Discharge Date:                  Expected Discharge Plan:  [place]  In-House Referral:     Discharge planning Services  CM Consult  Post Acute Care Choice:  IP Rehab Choice offered to:  Patient  DME Arranged:    DME Agency:     HH Arranged:    [place] Agency:     Status of Service:  Completed, signed off  If discussed at [employer] of Stay Meetings, dates discussed:    Discharge Disposition: [facility] IP rehab   Additional Comments:  04/03/18- 1555- Caitlyn Gibbons RN, CM- per Lenna Sciara with CIR- pt has insurance approval and bed available today to admit to CIR. Per MD pt is medially stable for transition to IP rehab and will transition there later today.   Dawayne Patricia, RN 04/03/2018, 3:52 PM

## 2018-04-03 NOTE — H&P (View-Only) (Signed)
Physical Medicine and Rehabilitation Admission H&P        Chief Complaint  Patient presents with  .  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  She was treated with  Solu-Medrol and nebs with improvement in respiratory status.  PCCM following for input and has signed off as respiratory status has improved.  Therapy evaluations done with patient limited by left hip pain with unsteady gait as well as shortness of breath.  CIR recommended due to functional deficits      Review of Systems  Constitutional: Negative for chills and fever.  HENT: Negative for hearing loss and tinnitus.   Eyes: Negative for blurred vision and double vision.  Respiratory: Positive for shortness of breath and wheezing. Negative for cough.   Cardiovascular: Negative for chest pain and palpitations.  Gastrointestinal: Positive for heartburn.  Genitourinary: Negative for dysuria and urgency.  Musculoskeletal: Positive for joint pain (left hip pain --better with steriods. ). Negative for myalgias.  Skin: Negative for rash.  Neurological: Negative for dizziness and headaches.  Psychiatric/Behavioral: The patient is nervous/anxious (feels a little jittery. ) and has insomnia.             Past Medical History:  Diagnosis Date  . Allergic rhinitis    . Asthma    . Diabetes mellitus without complication (HCC)      Type ii  . Dyspnea    . Family history of adverse reaction to anesthesia    . GERD (gastroesophageal reflux disease)    . Hyperlipidemia    . Hypertension    . Hypothyroidism    . Left bundle branch block             Past  Surgical History:  Procedure Laterality Date  . BREAST SURGERY Left      lumpectomy  . CESAREAN SECTION      . COLONOSCOPY      . DILATION AND CURETTAGE OF UTERUS      . FLEXIBLE BRONCHOSCOPY N/A 03/24/2018    Procedure: FLEXIBLE BRONCHOSCOPY;  Surgeon: Melrose Nakayama, MD;  Location: [facility];  Service: Thoracic;  Laterality: N/A;  . LASER BRONCHOSCOPY N/A 03/24/2018    Procedure: LASER BRONCHOSCOPY FOR RESECTION OF TRACHEAL WEB;  Surgeon: Melrose Nakayama, MD;  Location: [facility] OR;  Service: Thoracic;  Laterality: N/A;  . TONSILLECTOMY      . VIDEO BRONCHOSCOPY Bilateral 02/03/2018    Procedure: VIDEO BRONCHOSCOPY WITHOUT FLUORO;  Surgeon: Collene Gobble, MD;  Location: [facility] ENDOSCOPY;  Service: Cardiopulmonary;  Laterality: Bilateral;           Family History  Problem Relation Age of Onset  . Glaucoma Father    . Macular degeneration Maternal Grandmother        Social History: Married.  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  Independent but limited by shortness of breath for past few months.  She reports that she has never smoked. She has never used smokeless tobacco. She reports that she drank alcohol. She reports that she has current or past drug history.  Allergies  Allergen Reactions  . Penicillins Hives and Rash      Has patient had a PCN reaction causing immediate rash, facial/tongue/throat swelling, SOB or lightheadedness with hypotension: No Has patient had a PCN reaction causing severe rash involving mucus membranes or skin necrosis: No Has patient had a PCN reaction that required hospitalization: No Has patient had a PCN reaction occurring within the last 10 years: No If all of the above answers are "NO", then may proceed with Cephalosporin use.              Medications Prior to Admission  Medication Sig Dispense Refill  . albuterol (PROAIR HFA) 108 (90 Base) MCG/ACT inhaler Inhale 2 puffs into the lungs 4 (four) times daily as needed for  wheezing or shortness of breath.       . ALPRAZolam (XANAX) 0.25 MG tablet Take 0.25 mg by mouth daily as needed for anxiety.      Marland Kitchen aspirin 81 MG tablet Take 81 mg by mouth daily.       Marland Kitchen atorvastatin (LIPITOR) 20 MG tablet Take 20 tablets by mouth daily.       . cetirizine (ZYRTEC) 10 MG tablet Take 10 mg by mouth at bedtime.       . Cholecalciferol 2000 units TABS Take 2,000 Units by mouth daily.       Marland Kitchen escitalopram (LEXAPRO) 10 MG tablet Take 10 mg by mouth daily.       Marland Kitchen esomeprazole (NEXIUM) 20 MG capsule Take 40 mg by mouth daily at 12 noon.      . fluticasone (FLONASE) 50 MCG/ACT nasal spray Inhale 1 spray into the lungs daily.      . Hypromellose 0.3 % SOLN Place 1 application into both eyes at bedtime.      Marland Kitchen levothyroxine (SYNTHROID, LEVOTHROID) 75 MCG tablet Take 75 mcg by mouth daily before breakfast.      . metFORMIN (GLUCOPHAGE) 1000 MG tablet Take 1,000 mg by mouth daily.       . valsartan (DIOVAN) 160 MG tablet Take 1 tablet (160 mg total) by mouth daily. 31 tablet 4  . acetaminophen (TYLENOL) 500 MG tablet Take 1,000 mg by mouth every 6 (six) hours as needed for mild pain.          Drug Regimen Review  Drug regimen was reviewed and remains appropriate with no significant issues identified   Home: Home Living Family/patient expects to be discharged to:: Private residence Living Arrangements: Spouse/significant other Available Help at Discharge: Family, Available PRN/intermittently(spouse works) Type of Home: House Home Access: Stairs to enter Technical brewer of Steps: 3 Entrance Stairs-Rails: None Home Layout: One level Bathroom Shower/Tub: Chiropodist: Standard Bathroom Accessibility: Yes Home Equipment: Cane - single point   Functional History: Prior Function Level of Independence: Independent with assistive device(s) Comments: pt using cane, avoided socks due to inability to don, increased time for donning pants, difficulty rising  from toilet   Functional Status:  Mobility: Bed Mobility Overal bed mobility: Modified Independent General bed mobility comments: pt received in chair Transfers Overall transfer level: Needs assistance Equipment used: Rolling walker (2 wheeled) Transfers: Sit to/from Stand Sit to Stand: Supervision General transfer comment: cues for hand placement Ambulation/Gait Ambulation/Gait assistance: Min assist Gait Distance (Feet): 35 Feet Assistive device: Rolling walker (2 wheeled) Gait Pattern/deviations: Decreased step length - right, Decreased stance time - left, Decreased stride length, Shuffle, Antalgic, Step-through pattern General Gait Details: minA for steadying with RW, increased pain  in L hip with ambulation, vc for use of RW to offload L hip,  Gait velocity: slowed Gait velocity interpretation: <1.8 ft/sec, indicate of risk for recurrent falls   ADL: ADL Overall ADL's : Needs assistance/impaired Eating/Feeding: Independent, Sitting Grooming: Min guard, Wash/dry hands, Standing Upper Body Bathing: Set up, Sitting Lower Body Bathing: Minimal assistance, Sit to/from stand Upper Body Dressing : Set up, Sitting Lower Body Dressing: Minimal assistance, Sit to/from stand Toilet Transfer: Min guard, Ambulation, RW, BSC Toileting- Clothing Manipulation and Hygiene: Minimal assistance, Sit to/from stand Functional mobility during ADLs: Minimal assistance, Min guard, Rolling walker(very slow)   Cognition: Cognition Overall Cognitive Status: Within Functional Limits for tasks assessed Orientation Level: Oriented X4 Cognition Arousal/Alertness: Awake/alert Behavior During Therapy: WFL for tasks assessed/performed Overall Cognitive Status: Within Functional Limits for tasks assessed     Blood pressure (!) 154/108, pulse 85, temperature 98.4 F (36.9 C), temperature source Oral, resp. rate 14, height 5' (1.524 m), weight 89.2 kg, SpO2 97 %. Physical Exam  Nursing note and vitals  reviewed. Constitutional: She is oriented to person, place, and time.  Neurological: She is alert and oriented to person, place, and time.  Speech clear. Able to follow commands without difficulty.    Physical Exam  Constitutional: She is oriented to person, place, and time and well-developed, well-nourished, and in no distress. No distress.  HENT:  Head: Normocephalic and atraumatic.  Eyes: Pupils are equal, round, and reactive to light. Conjunctivae and EOM are normal.  Neck: Normal range of motion.  Cardiovascular: Normal rate, regular rhythm and normal heart sounds. Exam reveals no friction rub.  No murmur heard. Pulmonary/Chest: Effort normal. No respiratory distress. She has no wheezes.  Breath sounds slightly diminished  Abdominal: Soft. Bowel sounds are normal. She exhibits no distension. There is no tenderness.  Musculoskeletal:  Left hip internal/external rotation accompanied by pain  Neurological: She is alert and oriented to person, place, and time.  Strength is 5/5 bilateral deltoid bicep tricep grip hip flexor knee extensor ankle dorsiflexor  Station intact light touch bilateral upper and lower limb  Skin: Skin is warm and dry. She is not diaphoretic.  Psychiatric: Affect normal.  Nursing note and vitals reviewed.    Lab Results Last 48 Hours        Results for orders placed or performed during the hospital encounter of 03/29/18 (from the past 48 hour(s))  Glucose, capillary     Status: Abnormal    Collection Time: 03/31/18  3:18 PM  Result Value Ref Range    Glucose-Capillary 181 (H) 70 - 99 mg/dL  Glucose, capillary     Status: Abnormal    Collection Time: 03/31/18  7:39 PM  Result Value Ref Range    Glucose-Capillary 171 (H) 70 - 99 mg/dL  Glucose, capillary     Status: Abnormal    Collection Time: 04/01/18 12:18 AM  Result Value Ref Range    Glucose-Capillary 165 (H) 70 - 99 mg/dL  Glucose, capillary     Status: Abnormal    Collection Time: 04/01/18  3:35  AM  Result Value Ref Range    Glucose-Capillary 146 (H) 70 - 99 mg/dL  CBC     Status: Abnormal    Collection Time: 04/01/18  5:51 AM  Result Value Ref Range    WBC 12.3 (H) 4.0 - 10.5 K/uL    RBC 3.88 3.87 - 5.11 MIL/uL    Hemoglobin 12.3 12.0 - 15.0 g/dL    HCT 38.7 36.0 -  46.0 %    MCV 99.7 78.0 - 100.0 fL    MCH 31.7 26.0 - 34.0 pg    MCHC 31.8 30.0 - 36.0 g/dL    RDW 12.9 11.5 - 15.5 %    Platelets 274 150 - 400 K/uL      Comment: Performed at [facility], [place] [address] [place], [place] [address]  Basic metabolic panel     Status: Abnormal    Collection Time: 04/01/18  5:51 AM  Result Value Ref Range    Sodium 139 135 - 145 mmol/L    Potassium 3.4 (L) 3.5 - 5.1 mmol/L    Chloride 102 98 - 111 mmol/L    CO2 30 22 - 32 mmol/L    Glucose, Bld 135 (H) 70 - 99 mg/dL    BUN 19 6 - 20 mg/dL    Creatinine, Ser 0.98 0.44 - 1.00 mg/dL    Calcium 8.8 (L) 8.9 - 10.3 mg/dL    GFR calc non Af Amer >60 >60 mL/min    GFR calc Af Amer >60 >60 mL/min      Comment: (NOTE) The eGFR has been calculated using the CKD EPI equation. This calculation has not been validated in all clinical situations. eGFR's persistently <60 mL/min signify possible Chronic Kidney Disease.      Anion gap 7 5 - 15      Comment: Performed at [facility] [address] [place], [place] [address]  Magnesium     Status: None    Collection Time: 04/01/18  5:51 AM  Result Value Ref Range    Magnesium 2.2 1.7 - 2.4 mg/dL      Comment: Performed at [facility] [address], [place], [place] [address]  Phosphorus     Status: None    Collection Time: 04/01/18  5:51 AM  Result Value Ref Range    Phosphorus 3.6 2.5 - 4.6 mg/dL      Comment: Performed at [facility] [address], [place], [place] [address]  Glucose, capillary     Status: Abnormal    Collection Time: 04/01/18  7:34 AM  Result Value Ref Range    Glucose-Capillary 116 (H) 70 - 99 mg/dL  Glucose, capillary     Status:  Abnormal    Collection Time: 04/01/18 11:22 AM  Result Value Ref Range    Glucose-Capillary 120 (H) 70 - 99 mg/dL  Glucose, capillary     Status: Abnormal    Collection Time: 04/01/18  3:23 PM  Result Value Ref Range    Glucose-Capillary 204 (H) 70 - 99 mg/dL  Glucose, capillary     Status: Abnormal    Collection Time: 04/01/18  5:36 PM  Result Value Ref Range    Glucose-Capillary 213 (H) 70 - 99 mg/dL  Glucose, capillary     Status: Abnormal    Collection Time: 04/01/18  9:52 PM  Result Value Ref Range    Glucose-Capillary 237 (H) 70 - 99 mg/dL  Magnesium     Status: None    Collection Time: 04/02/18  4:18 AM  Result Value Ref Range    Magnesium 2.1 1.7 - 2.4 mg/dL      Comment: Performed at [facility], [place] [address], [place], [place] [address]  Comprehensive metabolic panel     Status: Abnormal    Collection Time: 04/02/18  4:18 AM  Result Value Ref Range    Sodium 136 135 -  145 mmol/L    Potassium 4.2 3.5 - 5.1 mmol/L      Comment: NO VISIBLE HEMOLYSIS    Chloride 100 98 - 111 mmol/L    CO2 26 22 - 32 mmol/L    Glucose, Bld 156 (H) 70 - 99 mg/dL    BUN 17 6 - 20 mg/dL    Creatinine, Ser 0.85 0.44 - 1.00 mg/dL    Calcium 8.9 8.9 - 10.3 mg/dL    Total Protein 6.3 (L) 6.5 - 8.1 g/dL    Albumin 3.4 (L) 3.5 - 5.0 g/dL    AST 16 15 - 41 U/L    ALT 19 0 - 44 U/L    Alkaline Phosphatase 63 38 - 126 U/L    Total Bilirubin 0.6 0.3 - 1.2 mg/dL    GFR calc non Af Amer >60 >60 mL/min    GFR calc Af Amer >60 >60 mL/min      Comment: (NOTE) The eGFR has been calculated using the CKD EPI equation. This calculation has not been validated in all clinical situations. eGFR's persistently <60 mL/min signify possible Chronic Kidney Disease.      Anion gap 10 5 - 15      Comment: Performed at [facility] [address], [place], [place] [address]  CBC     Status: Abnormal    Collection Time: 04/02/18  4:18 AM  Result Value Ref Range    WBC 12.3 (H) 4.0 - 10.5 K/uL     RBC 4.15 3.87 - 5.11 MIL/uL    Hemoglobin 13.0 12.0 - 15.0 g/dL    HCT 40.5 36.0 - 46.0 %    MCV 97.6 78.0 - 100.0 fL    MCH 31.3 26.0 - 34.0 pg    MCHC 32.1 30.0 - 36.0 g/dL    RDW 12.5 11.5 - 15.5 %    Platelets 279 150 - 400 K/uL      Comment: Performed at [facility], [place] [address] [place], [place] [address]  Glucose, capillary     Status: Abnormal    Collection Time: 04/02/18 10:29 AM  Result Value Ref Range    Glucose-Capillary 135 (H) 70 - 99 mg/dL      Imaging Results (Last 48 hours)  No results found.           Medical Problem List and Plan: 1.  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 2.  DVT Prophylaxis/Anticoagulation: Pharmaceutical: Lovenox 3. Endstage OA left hip/Pain Management:  Schedule tylenol. Will add Aspercreme/Ice prn 4. Mood: LCSW to follow for evaluation and support.   5. Neuropsych: This patient is capable of making decisions on her own behalf. 6. Skin/Wound Care: Routine pressure relief measures 7. Fluids/Electrolytes/Nutrition: Monitor I's and O's.  Check BMP in a.m. 8. T2DM: Monitor blood sugars AC at bedtime.  Continue metformin daily with sliding scale insulin for elevated blood sugars. 9. HTN: Monitor blood pressures twice daily.  Continue Avapro daily 10 GERD: Managed with Protonix 11. Asthma with DOE/trachel stenosis: Continue duo nebs 4 times daily. Slow steroid taper recommended.  12.  History of depression/anxiety: Continue Lexapro.  Continue Ativan as needed for increased anxiety likely due to steroids.  13. Leucocytosis: Likely due to steroids--monitor for signs of infection.   Post Admission Physician Evaluation: 1. Functional deficits secondary  to Debility, tracheal stenosis and end stage Left hip OA. 2. Patient admitted to receive collaborative, interdisciplinary care between the physiatrist, rehab nursing staff, and  therapy team. 3. Patient's level of medical complexity and substantial therapy needs  in context of that medical necessity cannot be provided at a lesser intensity of care. 4. Patient has experienced substantial functional loss from his/her baseline.Judging by the patient's diagnosis, physical exam, and functional history, the patient has potential for functional progress which will result in measurable gains while on inpatient rehab.  These gains will be of substantial and practical use upon discharge in facilitating mobility and self-care at the household level. 5. Physiatrist will provide 24 hour management of medical needs as well as oversight of the therapy plan/treatment and provide guidance as appropriate regarding the interaction of the two. 6. 24 hour rehab nursing will assist in the management of  bladder management, bowel management, safety, skin/wound care, disease management, medication administration, pain management and patient education  and help integrate therapy concepts, techniques,education, etc. 7. PT will assess and treat for: DME instruction, Gait training, Stair training, Functional mobility training, Therapeutic activities, Therapeutic exercise, Balance training, Patient/family education.  Goals are: independent with assistive device. 8. OT will assess and treat for Self-care/ADL training, Energy conservation, DME and/or AE instruction, Patient/family education, Balance training, Therapeutic activities.  Goals are: independent with assistive device.  9. SLP will assess and treat for  .  Goals are: N/A. 10. Case Management and Social Worker will assess and treat for psychological issues and discharge planning. 11. Team conference will be held weekly to assess progress toward goals and to determine barriers to discharge. 12.  Patient will receive at least 3 hours of therapy per day at least 5 days per week. 13. ELOS and Prognosis: 7d excellent      "I have personally performed a face to face diagnostic evaluation of this patient.  Additionally, I have reviewed  and concur with the physician assistant's documentation above."  Charlett Blake M.D. [facility] FAAPM&R (Sports Med, Neuromuscular Med) Diplomate [place] Board of [place], PA-C 04/02/2018

## 2018-04-03 NOTE — Progress Notes (Signed)
Patient ID: Caitlyn Anderson, female   DOB: 1961/06/17, 57 y.o.   MRN: [id number] Patient and husband arrived with NT from 4E04 and patient belongings. Oriented to room, nurse call system, chair alarm, fall prevention plan, rehab safety plan, health resource notebook, and rehab schedule with verbal understanding. Patient sitting in recliner with chair alarm in place and armed. Patient has no c/o pain.

## 2018-04-03 NOTE — Progress Notes (Addendum)
[facility] [address]            [place] [address]          [phone]       Total Length of Stay:  LOS: 4 days   Subjective: Patient without complaints this am.  Objective: Vital signs in last 24 hours: Temp:  [97.7 F (36.5 C)-98.4 F (36.9 C)] 97.7 F (36.5 C) (09/26 0542) Pulse Rate:  [64-88] 72 (09/26 0542) Cardiac Rhythm: Normal sinus rhythm (09/26 0700) Resp:  [12-19] 12 (09/26 0542) BP: (119-154)/(68-108) 147/97 (09/26 0542) SpO2:  [96 %-100 %] 96 % (09/26 0542)  Filed Weights   03/30/18 0412 03/31/18 0336 04/01/18 0500  Weight: 86.3 kg 89.2 kg 89.2 kg    Intake/Output from previous day: 09/25 0701 - 09/26 0700 In: 930 [P.O.:930] Out: 600 [Urine:600]  Intake/Output this shift: No intake/output data recorded.  Current Meds: Scheduled Meds: . aspirin EC  81 mg Oral Daily  . atorvastatin  40 mg Oral q1800  . Chlorhexidine Gluconate Cloth  6 each Topical Q0600  . escitalopram  10 mg Oral Daily  . fluticasone  1 spray Each Nare Daily  . guaiFENesin  600 mg Oral BID  . heparin  5,000 Units Subcutaneous Q8H  . insulin aspart  0-15 Units Subcutaneous TID WC  . insulin aspart  0-5 Units Subcutaneous QHS  . ipratropium-albuterol  3 mL Nebulization BID  . irbesartan  150 mg Oral Daily  . levothyroxine  75 mcg Oral QAC breakfast  . metFORMIN  1,000 mg Oral Q breakfast  . mupirocin ointment  1 application Nasal BID  . pantoprazole  40 mg Oral Daily  . predniSONE  40 mg Oral Q breakfast   Continuous Infusions:  PRN Meds:.albuterol, hydrALAZINE, LORazepam, Racepinephrine HCl  General appearance: alert, cooperative and no distress Heart: RRR Lungs: clear to auscultation bilaterally, some stridor  Abdomen: Soft, obese, non tender, bowel sounds present Extremities: SCDs in place  Lab Results: CBC: Recent Labs    04/02/18 0418 04/03/18 0313  WBC 12.3* 15.4*  HGB 13.0 13.1  HCT 40.5 39.9  PLT 279 302   BMET:  Recent  Labs    04/02/18 0418 04/03/18 0313  NA 136 135  K 4.2 4.2  CL 100 100  CO2 26 27  GLUCOSE 156* 176*  BUN 17 18  CREATININE 0.85 0.86  CALCIUM 8.9 9.3    CMET: Lab Results  Component Value Date   WBC 15.4 (H) 04/03/2018   HGB 13.1 04/03/2018   HCT 39.9 04/03/2018   PLT 302 04/03/2018   GLUCOSE 176 (H) 04/03/2018   ALT 19 04/02/2018   AST 16 04/02/2018   NA 135 04/03/2018   K 4.2 04/03/2018   CL 100 04/03/2018   CREATININE 0.86 04/03/2018   BUN 18 04/03/2018   CO2 27 04/03/2018   INR 1.00 03/24/2018      PT/INR: No results for input(s): LABPROT, INR in the last 72 hours. Radiology: No results found.   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  1. CV-SR in the 60-70's. 2. Pulmonary-on room air. On Prednisone, Duo nebs and Mucinex. As discussed with Dr. Roxan Hockey, he recommends slowly tapering Prednisone over several weeks.  3. DM-CBGs 219/226/125. On Metformin and Insulin. Per primary    Donielle Liston Alba PA-C 04/03/2018 7:14 AM   Patient seen and  examined, agree with above  Remo Lipps C. Roxan Hockey, MD [facility] [phone]

## 2018-04-04 ENCOUNTER — Inpatient Hospital Stay (HOSPITAL_COMMUNITY): Payer: BLUE CROSS/BLUE SHIELD | Admitting: Physical Therapy

## 2018-04-04 ENCOUNTER — Inpatient Hospital Stay (HOSPITAL_COMMUNITY): Payer: BLUE CROSS/BLUE SHIELD | Admitting: Occupational Therapy

## 2018-04-04 DIAGNOSIS — R5381 Other malaise: Principal | ICD-10-CM

## 2018-04-04 DIAGNOSIS — M1612 Unilateral primary osteoarthritis, left hip: Secondary | ICD-10-CM

## 2018-04-04 DIAGNOSIS — R061 Stridor: Secondary | ICD-10-CM

## 2018-04-04 LAB — COMPREHENSIVE METABOLIC PANEL
ALK PHOS: 61 U/L (ref 38–126)
ALT: 22 U/L (ref 0–44)
AST: 17 U/L (ref 15–41)
Albumin: 3.5 g/dL (ref 3.5–5.0)
Anion gap: 10 (ref 5–15)
BILIRUBIN TOTAL: 0.8 mg/dL (ref 0.3–1.2)
BUN: 20 mg/dL (ref 6–20)
CALCIUM: 9.8 mg/dL (ref 8.9–10.3)
CO2: 28 mmol/L (ref 22–32)
CREATININE: 0.92 mg/dL (ref 0.44–1.00)
Chloride: 99 mmol/L (ref 98–111)
GFR calc non Af Amer: >60 mL/min (ref >60)
Glucose, Bld: 131 mg/dL — ABNORMAL HIGH (ref 70–99)
Potassium: 3.6 mmol/L (ref 3.5–5.1)
Sodium: 137 mmol/L (ref 135–145)
TOTAL PROTEIN: 6.8 g/dL (ref 6.5–8.1)

## 2018-04-04 LAB — CBC WITH DIFFERENTIAL/PLATELET
Abs Immature Granulocytes: 0.2 10*3/uL — ABNORMAL HIGH (ref 0.0–0.1)
Basophils Absolute: 0.1 10*3/uL (ref 0.0–0.1)
Basophils Relative: 1 %
EOS PCT: 1 %
Eosinophils Absolute: 0.2 10*3/uL (ref 0.0–0.7)
HEMATOCRIT: 42.6 % (ref 36.0–46.0)
HEMOGLOBIN: 14.3 g/dL (ref 12.0–15.0)
Immature Granulocytes: 1 %
LYMPHS ABS: 4.1 10*3/uL — AB (ref 0.7–4.0)
LYMPHS PCT: 24 %
MCH: 32.1 pg (ref 26.0–34.0)
MCHC: 33.6 g/dL (ref 30.0–36.0)
MCV: 95.5 fL (ref 78.0–100.0)
MONO ABS: 1.4 10*3/uL — AB (ref 0.1–1.0)
MONOS PCT: 8 %
Neutro Abs: 11.5 10*3/uL — ABNORMAL HIGH (ref 1.7–7.7)
Neutrophils Relative %: 65 %
Platelets: 306 10*3/uL (ref 150–400)
RBC: 4.46 MIL/uL (ref 3.87–5.11)
RDW: 12.5 % (ref 11.5–15.5)
WBC: 17.4 10*3/uL — ABNORMAL HIGH (ref 4.0–10.5)

## 2018-04-04 LAB — CULTURE, BLOOD (ROUTINE X 2)
Culture: NO GROWTH
Culture: NO GROWTH
SPECIAL REQUESTS: ADEQUATE
SPECIAL REQUESTS: ADEQUATE

## 2018-04-04 LAB — GLUCOSE, CAPILLARY
Glucose-Capillary: 170 mg/dL — ABNORMAL HIGH (ref 70–99)
Glucose-Capillary: 116 mg/dL — ABNORMAL HIGH (ref 70–99)
Glucose-Capillary: 186 mg/dL — ABNORMAL HIGH (ref 70–99)
Glucose-Capillary: 126 mg/dL — ABNORMAL HIGH (ref 70–99)

## 2018-04-04 NOTE — Evaluation (Addendum)
Physical Therapy Assessment and Plan  Patient Details  Name: Caitlyn Anderson MRN: [id number] Date of Birth: 1961/03/10  PT Diagnosis: Abnormality of gait, Difficulty walking, Muscle weakness and Pain in L hip Rehab Potential: Excellent ELOS: 1 week   Today's Date: 04/04/2018 PT Individual Time: 0906-1000 PT Individual Time Calculation (min): 54 min    Problem List:  Patient Active Problem List   Diagnosis Date Noted  . Debility   . Primary osteoarthritis of left hip   . Stridor   . 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 Tracheal stricture   . Dyspnea 01/28/2018  . Wheezing 12/16/2017    Past Medical History:  Past Medical History:  Diagnosis Date  . Allergic rhinitis   . Asthma   . 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 Dyspnea   . Family history of adverse reaction to anesthesia   . GERD (gastroesophageal reflux disease)   . Hyperlipidemia   . Hypertension   . Hypothyroidism   . Left bundle branch block    Past Surgical History:  Past Surgical History:  Procedure Laterality Date  . BREAST SURGERY Left    lumpectomy  . CESAREAN SECTION    . COLONOSCOPY    . DILATION AND CURETTAGE OF UTERUS    . FLEXIBLE BRONCHOSCOPY N/A 03/24/2018   Procedure: FLEXIBLE BRONCHOSCOPY;  Surgeon: Melrose Nakayama, MD;  Location: [place];  Service: Thoracic;  Laterality: N/A;  . LASER BRONCHOSCOPY N/A 03/24/2018   Procedure: LASER BRONCHOSCOPY FOR RESECTION OF TRACHEAL WEB;  Surgeon: Melrose Nakayama, MD;  Location: [facility] OR;  Service: Thoracic;  Laterality: N/A;  . TONSILLECTOMY    . VIDEO BRONCHOSCOPY Bilateral 02/03/2018   Procedure: VIDEO BRONCHOSCOPY WITHOUT FLUORO;  Surgeon: Collene Gobble, MD;  Location: [facility] ENDOSCOPY;  Service: Cardiopulmonary;  Laterality: Bilateral;    Assessment & Plan Clinical Impression: Patient is a 57 y.o. 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 She was treated with Solu-Medrol and nebs with improvement in respiratory status. PCCM following for input and has signed off as respiratory status has improved. Therapy evaluations done with patient limited by left hip pain with unsteady gait as well as shortness of breath. CIR recommended due to functional deficits.  Patient transferred to CIR on 04/03/2018 .   Patient currently requires min with mobility secondary to muscle weakness, decreased cardiorespiratoy endurance, and decreased standing balance and decreased balance strategies.  Prior to hospitalization, patient was modified independent  with mobility and lived with Spouse in a House home.  Home access is 3Stairs to enter.  Patient will benefit from skilled PT intervention to maximize safe functional mobility, minimize fall risk and decrease caregiver burden for planned discharge home with intermittent assist.  Anticipate patient will benefit from follow up OP at discharge.  PT - End of Session Activity Tolerance: Decreased this session Endurance Deficit: Yes Endurance Deficit Description: decreased cardiopulmonary endurance PT Assessment Rehab Potential (ACUTE/IP ONLY): Excellent PT Barriers to Discharge: Decreased caregiver support;Home environment access/layout;Lack of/limited family support PT Barriers to Discharge Comments: husband works 2 days on, 2 days off, pt has 3 steps without rails to enter home PT Patient demonstrates impairments in the following area(s): Balance;Pain;Endurance;Motor PT Transfers Functional Problem(s): Bed Mobility;Bed  to Chair;Car;Furniture PT Locomotion Functional Problem(s): Ambulation;Stairs PT Plan PT Intensity: Minimum of 1-2 x/day ,45 to 90 minutes PT Frequency: 5 out of 7 days PT Duration Estimated  Length of Stay: 1 week PT Treatment/Interventions: Ambulation/gait training;Discharge planning;DME/adaptive equipment instruction;Functional mobility training;Pain management;Psychosocial support;Therapeutic Activities;UE/LE Strength taining/ROM;UE/LE Coordination activities;Therapeutic Exercise;Stair training;Patient/family education;Neuromuscular re-education;Disease management/prevention;Community reintegration;Balance/vestibular training PT Transfers Anticipated Outcome(s): mod I with LRAD PT Locomotion Anticipated Outcome(s): mod I with LRAD except supervision for stair negotiation without rails but with LRAD PT Recommendation Recommendations for Other Services: Therapeutic Recreation consult Therapeutic Recreation Interventions: Clinical cytogeneticist;Outing/community reintergration Follow Up Recommendations: Outpatient PT Patient destination: Home Equipment Recommended: To be determined  Skilled Therapeutic Intervention Patient received in bed & agreeable to tx.  Evaluation initiated with therapist educating pt on ELOS, weekly interdisciplinary team meeting, daily therapy schedule, safety plan (only nursing or therapy staff assist her OOB or out of chair), and other various CIR information. Pt transferred to EOB with mod I & hospital bed features and completes stand pivot bed>w/c with close supervision. Set pt up at sink & pt performed grooming tasks (brushing teeth, washing face & hands) without assistance. Pt completes w/c<>toilet transfer with supervision, armrests & grab bar; pt returns to w/c before notifying therapist she was finished & reeducated pt on need for increased safety & staff member present. Transported pt to gym and pt completes car transfer at [place] simulated height and gait over even & uneven surface & ramp with SPC and min assist. Pt with increased weight shifting and reliance on SPC 2/2 chronic L hip pain. Pt negotiates 12 steps (6" + 3") with 1 rail and SPC with min  assist. Educated pt proper use of SPC for balance vs stability and educated her on increased stability with RW or rollator. Gait training with rollator with max cuing for brake management with poor carryover of instructions (pt would benefit from further practice) but pt able to ambulate with AD with supervision from gym>room with more normalized gait pattern. At end of session pt left sitting in recliner with BLE elevated, all needs in reach, & staff member in room.   Pt reports "I can feel it" in regards to pain in L hip during ambulation but does not rate pain & rest breaks taken PRN.   Addendum: Educated pt on benefits of rail installation by her steps at home, especially if she plans to receive THA in future, as she reports.   PT Evaluation Precautions/Restrictions Precautions Precautions: Fall Precaution Comments: dyspneic with minimal exertion Restrictions Weight Bearing Restrictions: No  General Chart Reviewed: Yes Additional Pertinent History: DM2, Gerd, asthma, HLD, HTN, L BBB Response to Previous Treatment: Not applicable Family/Caregiver Present: No  Vital Signs SpO2 = 97% on room air, HR = 65 bpm after stair negotiation  Home Living/Prior Functioning Home Living Available Help at Discharge: Family;Available PRN/intermittently Type of Home: House Home Access: Stairs to enter [employer] of Steps: 3 Entrance Stairs-Rails: None Home Layout: One level Bathroom Accessibility: Yes  Lives With: Spouse Prior Function Level of Independence: Independent with basic ADLs;Independent with homemaking with ambulation;Independent with gait;Independent with transfers;Requires assistive device for independence(mod I with SPC for past 7-8 months 2/2 L hip OA)  Able to Take Stairs?: Yes(with SPC) Driving: Yes Vocation: Part time employment 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 Pt denies changes in  baseline vision. No apparent visual deficits noted.  Cognition Overall Cognitive Status: Within Functional Limits for tasks assessed Arousal/Alertness: Awake/alert Orientation Level: Oriented X4 Memory: Appears intact  Sensation Sensation Light Touch: Not tested Proprioception: Appears Intact Coordination Gross Motor Movements are Fluid and Coordinated: (coordination impaired during gait 2/2 L Hip pain) Fine Motor Movements are Fluid and Coordinated: Yes  Motor  Motor Motor - Skilled Clinical Observations: generalized deconditioning   Mobility Transfers Transfers: Sit to Stand;Stand Pivot Transfers Sit to Stand: Supervision/Verbal cueing Stand Pivot Transfers: Supervision/Verbal cueing  Locomotion  Gait Ambulation: Yes Gait Assistance: Minimal Assistance - Patient > 75% Gait Distance (Feet): 50 Feet Assistive device: Straight cane Gait Gait: Yes Gait Pattern: (increased weight shifting R to rely on SPC for balance & support 2/2 L hip pain, decreased stride length) Gait velocity: decreased Stairs / Additional Locomotion Stairs: Yes Stairs Assistance: Minimal Assistance - Patient > 75% Stair Management Technique: (1 rail & SPC) Number of Stairs: 12 Height of Stairs: (6" + 3") Ramp: Minimal Assistance - Patient >75%(ambulatory with SPC) Wheelchair Mobility Wheelchair Mobility: No   Trunk/Postural Assessment  Postural Control Postural Control: Deficits on evaluation Protective Responses: slowed   Balance Balance Balance Assessed: Yes Dynamic Standing Balance Dynamic Standing - Balance Support: Right upper extremity supported;During functional activity Dynamic Standing - Level of Assistance: 4: Min assist Dynamic Standing - Balance Activities: (during gait with SPC)   Extremity Assessment  RUE Assessment RUE Assessment: Within Functional  Limits LUE Assessment LUE Assessment: Within Functional Limits RLE Assessment RLE Assessment: Within Functional Limits LLE Assessment LLE Assessment: Within Functional Limits General Strength Comments: limited by chronic hip pain during gait   See Function Navigator for Current Functional Status.   Refer to Care Plan for Long Term Goals  Recommendations for other services: Therapeutic Recreation  Kitchen group, Stress management and Outing/community reintegration  Discharge Criteria: Patient will be discharged from PT if patient refuses treatment 3 consecutive times without medical reason, if treatment goals not met, if there is a change in medical status, if patient makes no progress towards goals or if patient is discharged from hospital.  The above assessment, treatment plan, treatment alternatives and goals were discussed and mutually agreed upon: by patient  Waunita Schooner 04/04/2018, 5:00 PM

## 2018-04-04 NOTE — Evaluation (Signed)
Occupational Therapy Assessment and Plan  Patient Details  Name: Caitlyn Anderson MRN: [id number] Date of Birth: 08/13/1960  OT Diagnosis: acute pain and muscle weakness (generalized) Rehab Potential: Rehab Potential (ACUTE ONLY): Excellent ELOS: 4-6 days   Today's Date: 04/04/2018 OT Individual Time: 1305-1400 OT Individual Time Calculation (min): 55 min     Problem List:  Patient Active Problem List   Diagnosis Date Noted  . Debility   . Primary osteoarthritis of left hip   . Stridor   . 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 Tracheal stricture   . Dyspnea 01/28/2018  . Wheezing 12/16/2017    Past Medical History:  Past Medical History:  Diagnosis Date  . Allergic rhinitis   . Asthma   . 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 Dyspnea   . Family history of adverse reaction to anesthesia   . GERD (gastroesophageal reflux disease)   . Hyperlipidemia   . Hypertension   . Hypothyroidism   . Left bundle branch block    Past Surgical History:  Past Surgical History:  Procedure Laterality Date  . BREAST SURGERY Left    lumpectomy  . CESAREAN SECTION    . COLONOSCOPY    . DILATION AND CURETTAGE OF UTERUS    . FLEXIBLE BRONCHOSCOPY N/A 03/24/2018   Procedure: FLEXIBLE BRONCHOSCOPY;  Surgeon: Hendrickson, Steven C, MD;  Location: [facility] OR;  Service: Thoracic;  Laterality: N/A;  . LASER BRONCHOSCOPY N/A 03/24/2018   Procedure: LASER BRONCHOSCOPY FOR RESECTION OF TRACHEAL WEB;  Surgeon: Hendrickson, Steven C, MD;  Location: [facility] OR;  Service: Thoracic;  Laterality: N/A;  . TONSILLECTOMY    . VIDEO BRONCHOSCOPY Bilateral 02/03/2018   Procedure: VIDEO BRONCHOSCOPY WITHOUT FLUORO;  Surgeon: Byrum, Robert S, MD;  Location: [facility] ENDOSCOPY;  Service: Cardiopulmonary;  Laterality: Bilateral;    Assessment & Plan Clinical Impression: Patient is a 57 y.o. 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 She was treated with Solu-Medrol and nebs with improvement in respiratory status. PCCM following for input and has signed off as respiratory status has improved. Therapy evaluations done with patient limited by left hip pain with unsteady gait as well as shortness of breath. CIR recommended due to functional deficit.   Patient transferred to CIR on 04/03/2018 .    Patient currently requires min with basic self-care skills secondary to muscle weakness, decreased cardiorespiratoy endurance and decreased oxygen support and decreased standing balance.  Prior to hospitalization, patient could complete ADLs with modified independent .  Patient will benefit from skilled intervention to increase independence with basic self-care skills prior to discharge home with care partner.  Anticipate patient will require intermittent supervision and no further OT follow recommended.  OT - End of Session Activity Tolerance: Tolerates 30+ min activity without fatigue Endurance Deficit: Yes Endurance Deficit Description: dyspnea with moderate exertion OT Assessment Rehab Potential (ACUTE ONLY): Excellent OT Patient demonstrates impairments in the following area(s): Balance;Endurance;Motor;Pain;Safety OT Basic ADL's Functional Problem(s): Grooming;Bathing;Dressing;Toileting OT Advanced ADL's Functional Problem(s): Simple Meal Preparation;Light Housekeeping OT Transfers Functional Problem(s): Toilet;Tub/Shower OT Plan OT Intensity: Minimum of 1-2 x/day, 45 to 90 minutes OT Frequency: 5 out of 7 days OT Duration/Estimated Length of Stay: 4-6 days OT Treatment/Interventions: Balance/vestibular training;Community   reintegration;Discharge planning;Disease mangement/prevention;DME/adaptive equipment  instruction;Functional mobility training;Pain management;Patient/family education;Psychosocial support;Self Care/advanced ADL retraining;Therapeutic Activities;Therapeutic Exercise;UE/LE Strength taining/ROM OT Basic Self-Care Anticipated Outcome(s): Mod I OT Toileting Anticipated Outcome(s): Mod I OT Bathroom Transfers Anticipated Outcome(s): Mod I OT Recommendation Patient destination: Home Follow Up Recommendations: None Equipment Recommended: Tub/shower bench   Skilled Therapeutic Intervention OT eval completed with discussion of rehab process, OT purpose, POC, ELOS, and goals.  ADL assessment completed with bathing/dressing at sit > stand level.  Pt ambulated to room shower with SPC (as pt used cane PTA) with min guard.  Pt completed toilet transfer and toileting task with min guard.  Ambulated to room shower with min guard, min assist transfer in to shower.  Completed bathing at overall min assist, due to requiring assistance to wash LLE due to hip pain/ OA.  Pt completed dressing with setup assist, with exception of donning Lt sock and shoe.  Pt educated on use of sock aid and shoe horn with pt able to then don socks and shoes.  Pt will benefit from elastic laces as unable to tie Lt shoe due to hip pain/OA.  OT Evaluation Precautions/Restrictions  Precautions Precautions: Fall Precaution Comments: dyspneic with minimal exertion General   Vital Signs Therapy Vitals Temp: 98 F (36.7 C) Temp Source: Oral Pulse Rate: 74 Resp: 18 BP: 110/68 Patient Position (if appropriate): Sitting Oxygen Therapy SpO2: 95 % O2 Device: Room Air Pain Pain Assessment Pain Scale: 0-10 Pain Score: 0-No pain Home Living/Prior Functioning Home Living Family/patient expects to be discharged to:: Private residence Living Arrangements: Spouse/significant other Available Help at Discharge: Family, Available PRN/intermittently(spouse works 12 hr shifts) Type of Home: House Home Access: Stairs to  enter Technical brewer of Steps: 3 Entrance Stairs-Rails: None Home Layout: One level Bathroom Shower/Tub: Chiropodist: Standard Bathroom Accessibility: Yes  Lives With: Spouse IADL History Homemaking Responsibilities: Yes Meal Prep Responsibility: Primary Laundry Responsibility: Primary Cleaning Responsibility: Primary Bill Paying/Finance Responsibility: Primary Shopping Responsibility: Primary Current License: Yes Prior Function Level of Independence: Independent with basic ADLs, Independent with homemaking with ambulation, Requires assistive device for independence  Able to Take Stairs?: Yes Driving: Yes Vocation: Part time employment 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[employer] Comments: pt using cane, avoided socks due to inability to don, increased time for donning pants ADL ADL Grooming: Contact guard Where Assessed-Grooming: Standing at sink Upper Body Bathing: Contact guard Where Assessed-Upper Body Bathing: Shower Lower Body Bathing: Contact guard Where Assessed-Lower Body Bathing: Shower Upper Body Dressing: Setup Where Assessed-Upper Body Dressing: Chair Lower Body Dressing: Minimal assistance Where Assessed-Lower Body Dressing: Chair Toileting: Contact guard Where Assessed-Toileting: Glass blower/designer: Psychiatric nurse Method: Educational psychologist Method: Sit pivot(transfer bench) Tub/Shower Equipment: Facilities manager: Curator Method: Heritage manager: Radio broadcast assistant Vision Baseline Vision/History: Wears glasses Wears Glasses: At all times Patient Visual Report: No change from baseline Vision Assessment?: No apparent visual deficits Cognition Overall Cognitive Status: Within Functional Limits for tasks assessed Arousal/Alertness: Awake/alert Orientation Level:  Person;Place;Situation Person: Oriented Place: Oriented Situation: Oriented Year: 2019 Month: September Day of Week: Correct Immediate Memory Recall: Sock;Blue;Bed Memory Recall: Sock;Blue;Bed Memory Recall Sock: Without Cue Memory Recall Blue: Without Cue Memory Recall Bed: Without Cue Sensation Sensation Light Touch: Appears Intact Coordination Gross Motor Movements are Fluid and Coordinated: Yes Fine Motor Movements are Fluid and Coordinated: Yes Extremity/Trunk Assessment RUE Assessment RUE Assessment: Within Functional Limits LUE  Assessment LUE Assessment: Within Functional Limits   See Function Navigator for Current Functional Status.   Refer to Care Plan for Long Term Goals  Recommendations for other services: None    Discharge Criteria: Patient will be discharged from OT if patient refuses treatment 3 consecutive times without medical reason, if treatment goals not met, if there is a change in medical status, if patient makes no progress towards goals or if patient is discharged from hospital.  The above assessment, treatment plan, treatment alternatives and goals were discussed and mutually agreed upon: by patient  HOXIE, SARAH 04/04/2018, 4:08 PM  

## 2018-04-04 NOTE — Interval H&P Note (Signed)
Caitlyn Anderson was admitted today to Inpatient Rehabilitation with the diagnosis of debility and left hip OA.  The patient's history has been reviewed, patient examined, and there is no change in status.  Patient continues to be appropriate for intensive inpatient rehabilitation.  I have reviewed the patient's chart and labs.  Questions were answered to the patient's satisfaction. The PAPE has been reviewed and assessment remains appropriate.  Caitlyn Anderson 04/04/2018, 6:50 AM

## 2018-04-04 NOTE — Progress Notes (Signed)
Attestation signed by Charlett Blake, MD at 04/03/2018 4:01 PM  Elevated WBCs but no fever likely due to steroids.  Looks stable for admission to rehab today       PMR Admission Coordinator Pre-Admission Assessment  Patient: Caitlyn Anderson is an 57 y.o., female MRN: [id number] DOB: Feb 14, 1961 Height: 5' (152.4 cm) Weight: 89.2 kg                                                                                                                                                  Insurance Information HMO:     PPO: Yes     PCP:      IPA:      80/20:      OTHER: [employer] PRIMARY: [employer]       Policy#: [id number]      Subscriber: Spouse CM Name: Isa Rankin      Phone#: [phone]     Fax#: [phone] Pre-Cert#: [id number]  For 04/03/18-04/16/18 with updates due on 04/15/18 if more time is warranted   Employer: Full Time Benefits:  Phone #: [phone]     Name: Verified online at [email or web address] Eff. Date: 05/10/07-present     Deduct: $150      Out of Pocket Max: $1000      Life Max: N/A CIR: 90%/10%      SNF: 90%/10% Outpatient: 60 combined PT/OT visits per calendar year      Co-Pay: $20 per visit  Home Health: 30 visits per calendar year, 90%      Co-Pay: 10% DME: 90%     Co-Pay: 10% Providers: In-network   SECONDARY: None        Emergency Contact Information         Contact Information    Name Relation Home Work [address]   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 She was treated with Solu-Medrol and nebs with improvement in respiratory status. PCCM following for input and has signed off as respiratory status has improved. Therapy evaluations done with patient limited by left hip pain with unsteady gait as well as shortness of breath. CIR recommended due to functional deficits and patient admitted 04/03/18.  Past Medical History      Past Medical History:  Diagnosis Date  . Allergic rhinitis   . Asthma   . Diabetes  mellitus without complication (HCC)    Type ii  . Dyspnea   . Family history of adverse reaction to anesthesia   . GERD (gastroesophageal reflux disease)   . Hyperlipidemia   . Hypertension   . Hypothyroidism   . Left bundle branch block     Family History  family history includes Glaucoma in her father; Macular degeneration in her maternal grandmother.  Prior Rehab/Hospitalizations:  Has the patient had major surgery during 100 days prior to admission? Yes  Current Medications   Current Facility-Administered Medications:  .  albuterol (PROVENTIL) (2.5 MG/3ML) 0.083% nebulizer solution 2.5 mg, 2.5 mg, Nebulization, Q2H PRN, Cherene Altes, MD .  aspirin EC tablet 81 mg, 81 mg, Oral, Daily, Agarwala, Ravi, MD, 81 mg at 04/03/18 1128 .  atorvastatin (LIPITOR) tablet 40 mg, 40 mg, Oral, q1800, Agarwala, Ravi, MD, 40 mg at 03/31/18 1751 .  Chlorhexidine Gluconate Cloth 2 % PADS 6 each, 6 each, Topical, Q0600, Kipp Brood, MD, 6 each at 04/02/18 1100 .  escitalopram (LEXAPRO) tablet 10 mg, 10 mg, Oral, Daily, Agarwala, Ravi, MD, 10 mg at 04/03/18 1128 .  fluticasone (FLONASE) 50 MCG/ACT nasal spray 1 spray, 1 spray, Each Nare, Daily, Agarwala, Ravi, MD, 1 spray at 04/03/18 1129 .  guaiFENesin (MUCINEX) 12 hr tablet 600 mg, 600 mg, Oral, BID, Melrose Nakayama, MD, 600 mg at 04/03/18 1128 .  heparin injection 5,000  Units, 5,000 Units, Subcutaneous, Q8H, Omar Person, NP, 5,000 Units at 04/02/18 2252 .  hydrALAZINE (APRESOLINE) injection 10 mg, 10 mg, Intravenous, Q6H PRN, Dewaine Oats, Katalina M, NP .  insulin aspart (novoLOG) injection 0-15 Units, 0-15 Units, Subcutaneous, TID WC, Cherene Altes, MD, 2 Units at 04/03/18 1207 .  insulin aspart (novoLOG) injection 0-5 Units, 0-5 Units, Subcutaneous, QHS, Cherene Altes, MD, 2 Units at 04/02/18 2253 .  ipratropium-albuterol (DUONEB) 0.5-2.5 (3) MG/3ML nebulizer solution 3 mL, 3 mL, Nebulization, BID, Elodia Florence., MD, 3 mL at 04/03/18 0843 .  irbesartan (AVAPRO) tablet 150 mg, 150 mg, Oral, Daily, Agarwala, Ravi, MD, 150 mg at 04/03/18 1128 .  levothyroxine (SYNTHROID, LEVOTHROID) tablet 75 mcg, 75 mcg, Oral, QAC breakfast, Kipp Brood, MD, 75 mcg at 04/03/18 0849 .  LORazepam (ATIVAN) tablet 0.5 mg, 0.5 mg, Oral, Q6H PRN, Cherene Altes, MD, 0.5 mg at 04/01/18 2201 .  metFORMIN (GLUCOPHAGE) tablet 1,000 mg, 1,000 mg, Oral, Q breakfast, Rush Farmer, MD, 1,000 mg at 04/03/18 0849 .  pantoprazole (PROTONIX) EC tablet 40 mg, 40 mg, Oral, Daily, Agarwala, Ravi, MD, 40 mg at 04/03/18 1128 .  predniSONE (DELTASONE) tablet 40 mg, 40 mg, Oral, Q breakfast, Elodia Florence., MD, 40 mg at 04/03/18 0849 .  Racepinephrine HCl 2.25 % nebulizer solution 0.5 mL, 0.5 mL, Nebulization, Q1H PRN, Hammonds, Sharyn Blitz, MD  Patients Current Diet:     Diet Order                  Diet Carb Modified Fluid consistency: Thin; Room service appropriate? 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  She was fully independent, at work and at home with her single point cane.    Home Assistive Devices / Equipment Home Assistive Devices/Equipment: Cane (specify quad or straight) Home Equipment: Cane - single point  Prior Device Use: Indicate devices/aids used by the patient prior to current illness, exacerbation or injury? Single point cane   Prior Functional Level Prior Function Level of Independence: Independent with assistive device(s) Comments: pt using cane, avoided socks due to inability to don, increased time for donning pants, difficulty rising from toilet  Self Care: Did the patient need help bathing, dressing, using the toilet or eating? Independent  Indoor Mobility: Did the patient need assistance with walking from room to room (with or without device)? Independent  Stairs: Did the patient need assistance with internal or external stairs (with or without device)? Independent  Functional Cognition: Did the patient need help planning regular tasks such as shopping or remembering to take medications? Independent  Current Functional Level Cognition  Overall Cognitive Status: Within Functional Limits for tasks assessed Orientation Level: Oriented X4    Extremity Assessment (includes Sensation/Coordination)  Upper Extremity Assessment: Overall WFL for tasks assessed  Lower Extremity Assessment: Defer to PT evaluation LLE Deficits / Details: hip AROM limited, strength grossly assessed at 2+/5, knee and ankle ROM WFL, strength grossly assessed 4/5(pt plans L THA, tracheal sx in preparation) LLE: Unable to fully assess due to pain    ADLs  Overall ADL's : Needs assistance/impaired Eating/Feeding: Independent, Sitting Grooming: Min guard, Wash/dry hands, Standing Upper Body Bathing: Set up, Sitting Lower Body Bathing: Minimal assistance, Sit to/from stand Upper Body  Dressing : Set up, Sitting Lower Body Dressing: Minimal assistance, Sit to/from stand Toilet Transfer: Min guard, Ambulation, RW, BSC Toileting- Clothing Manipulation and Hygiene: Minimal assistance, Sit to/from stand Functional mobility during ADLs: Minimal assistance, Min guard, Rolling walker(very slow)    Mobility  Overal bed mobility: Modified Independent General bed mobility comments: pt received in chair    Transfers  Overall transfer level: Needs assistance Equipment used: Rolling walker (2 wheeled) Transfers: Sit to/from Stand Sit to Stand: Min guard General transfer comment: min guard for safety, decreased eccentric control during descent     Ambulation / Gait / Stairs / Emergency planning/management officer  Ambulation/Gait Ambulation/Gait assistance: Herbalist (Feet): 35 Feet Assistive device: Rolling walker (2 wheeled) Gait Pattern/deviations: Decreased step length - right, Decreased stance time - left, Decreased stride length, Shuffle, Antalgic, Step-through pattern General Gait Details: minA for steadying with RW, increased pain in L hip with ambulation, vc for use of RW to offload L hip,  Gait velocity: slowed Gait velocity interpretation: <1.8 ft/sec, indicate of risk for recurrent falls    Posture / Balance Balance Overall balance assessment: Needs assistance Sitting-balance support: Feet supported, No upper extremity supported Sitting balance-Leahy Scale: Good Standing balance support: Bilateral upper extremity supported Standing balance-Leahy Scale: Poor    Special needs/care consideration BiPAP/CPAP: No CPM: No Continuous Drip IV: No Dialysis: No         Life Vest: No Oxygen: No Special Bed: No Trach Size: No Wound Vac (area): No       Skin: WDL, Dry                               Bowel mgmt: Continent, last BM 04/02/18 Bladder mgmt: Continent  Diabetic mgmt: Yes, with oral medication  and diet prior to admission      Previous Home  Environment Living Arrangements: Spouse/significant other Available Help at Discharge: Family, Available PRN/intermittently(spouse works) Type of Home: House Home Layout: One level Home Access: Stairs to enter Entrance Stairs-Rails: None Technical brewer of Steps: 3 Bathroom Shower/Tub: Optometrist: Yes Bock: No  Discharge Living Setting Plans for Discharge Living Setting: Patient's home, Lives with (comment)(spouse) Type of Home at Discharge: House Discharge Home Layout: One level Discharge Home Access: Stairs to enter Entrance Stairs-Rails: None Entrance Stairs-Number of Steps: 3 Discharge Bathroom Shower/Tub: Tub/shower unit, Curtain Discharge Bathroom Toilet: Standard Discharge Bathroom Accessibility: Yes How Accessible: (cane or walker ) Does the patient have any problems obtaining your medications?: No  Social/Family/Support Systems Patient Roles: Spouse Contact Information: Spouse: David  Anticipated Caregiver: Patient with Mod I goals  Anticipated Caregiver's Contact Information: Cell:[phone] Ability/Limitations of Caregiver: Spouse works 12 hours shifts  Caregiver Availability: Intermittent Discharge Plan Discussed with Primary Caregiver: Yes Is Caregiver In Agreement with Plan?: Yes Does Caregiver/Family have Issues with Lodging/Transportation while Pt is in Rehab?: No  Goals/Additional Needs Patient/Family Goal for Rehab: PT/Ot: Mod I  Expected length of stay: ~7days  Cultural Considerations: Christian  Dietary Needs: Carb. Mod. diet restrictions  Equipment Needs: TBD Pt/Family Agrees to Admission and willing to participate: Yes Program Orientation Provided & Reviewed with Pt/Caregiver Including Roles  & Responsibilities: Yes Additional Information Needs: Close monitoring for resp. status Information Needs to be Provided By: Team FYI  Barriers to Discharge: Medical stability,  Other (comments)(Mod I given that spouse works )  Decrease burden of Care through IP rehab admission: No  Possible need for SNF placement upon discharge: No  Patient Condition: This patient's medical and functional status has changed since the consult dated: 04/01/18 at 1347 in which the Rehabilitation Physician determined and documented that the patient's condition is appropriate for intensive rehabilitative care in an inpatient rehabilitation facility. See "History of Present Illness" (above) for medical update. Functional changes are: Min guard transfers and Min A gait with rolling walker. Patient's medical and functional status update has been discussed with the Rehabilitation physician and patient remains appropriate for inpatient rehabilitation. Will admit to inpatient rehab today.  Preadmission Screen Completed By:  Gunnar Fusi, 04/03/2018 1:00 PM ______________________________________________________________________   Discussed status with Dr. Letta Pate on 04/03/18 at 1555 and received telephone approval for admission today.  Admission Coordinator:  Gunnar Fusi, time 1555/Date 04/03/18           Cosigned by: Charlett Blake, MD at 04/03/2018 4:01 PM  Revision History

## 2018-04-04 NOTE — Progress Notes (Signed)
Subjective/Complaints: No issues except became upset that she could not get OOB last noc  ROS  Denies CP, SOB, N/V/D  Objective: Vital Signs: Blood pressure 139/78, pulse 76, temperature 98.3 F (36.8 C), temperature source Oral, resp. rate 17, height 5' (1.524 m), weight 86.5 kg, SpO2 95 %. No results found. Results for orders placed or performed during the hospital encounter of 04/03/18 (from the past 72 hour(s))  CBC WITH DIFFERENTIAL     Status: Abnormal   Collection Time: 04/04/18  6:42 AM  Result Value Ref Range   WBC 17.4 (H) 4.0 - 10.5 K/uL   RBC 4.46 3.87 - 5.11 MIL/uL   Hemoglobin 14.3 12.0 - 15.0 g/dL   HCT 42.6 36.0 - 46.0 %   MCV 95.5 78.0 - 100.0 fL   MCH 32.1 26.0 - 34.0 pg   MCHC 33.6 30.0 - 36.0 g/dL   RDW 12.5 11.5 - 15.5 %   Platelets 306 150 - 400 K/uL   Neutrophils Relative % 65 %   Neutro Abs 11.5 (H) 1.7 - 7.7 K/uL   Lymphocytes Relative 24 %   Lymphs Abs 4.1 (H) 0.7 - 4.0 K/uL   Monocytes Relative 8 %   Monocytes Absolute 1.4 (H) 0.1 - 1.0 K/uL   Eosinophils Relative 1 %   Eosinophils Absolute 0.2 0.0 - 0.7 K/uL   Basophils Relative 1 %   Basophils Absolute 0.1 0.0 - 0.1 K/uL   Immature Granulocytes 1 %   Abs Immature Granulocytes 0.2 (H) 0.0 - 0.1 K/uL    Comment: Performed at [facility], [address], [place], [place] [address]  Comprehensive metabolic panel     Status: Abnormal   Collection Time: 04/04/18  6:42 AM  Result Value Ref Range   Sodium 137 135 - 145 mmol/L   Potassium 3.6 3.5 - 5.1 mmol/L   Chloride 99 98 - 111 mmol/L   CO2 28 22 - 32 mmol/L   Glucose, Bld 131 (H) 70 - 99 mg/dL   BUN 20 6 - 20 mg/dL   Creatinine, Ser 0.92 0.44 - 1.00 mg/dL   Calcium 9.8 8.9 - 10.3 mg/dL   Total Protein 6.8 6.5 - 8.1 g/dL   Albumin 3.5 3.5 - 5.0 g/dL   AST 17 15 - 41 U/L   ALT 22 0 - 44 U/L   Alkaline Phosphatase 61 38 - 126 U/L   Total Bilirubin 0.8 0.3 - 1.2 mg/dL   GFR calc non Af Amer >60 >60 mL/min   GFR calc Af Amer >60 >60  mL/min    Comment: (NOTE) The eGFR has been calculated using the CKD EPI equation. This calculation has not been validated in all clinical situations. eGFR's persistently <60 mL/min signify possible Chronic Kidney Disease.    Anion gap 10 5 - 15    Comment: Performed at [facility] [address], [place], [place] [address]  Glucose, capillary     Status: Abnormal   Collection Time: 04/04/18  7:39 AM  Result Value Ref Range   Glucose-Capillary 170 (H) 70 - 99 mg/dL     HEENT: mild stridor Left lung field Cardio: RRR and no murmur Resp: CTA B/L and unlabored GI: BS positive and NT, ND Extremity:  Pulses positive and No Edema Skin:   Intact Neuro: Alert/Oriented, Normal Sensory and Normal Motor Musc/Skel:  Other Left hip pain and contracture with int/ext rotation Gen NAD   Assessment/Plan: 1. Functional deficits secondary to Debility due to  tracheal stenosis and prior Left hip contracture which require 3+ hours per day of interdisciplinary therapy in a comprehensive inpatient rehab setting. Physiatrist is providing close team supervision and 24 hour management of active medical problems listed below. Physiatrist and rehab team continue to assess barriers to discharge/monitor patient progress toward functional and medical goals. FIM:                                  Medical Problem List and Plan: 1. 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 2. DVT Prophylaxis/Anticoagulation: Pharmaceutical:Lovenox 3.Endstage OA left hip/Pain Management:Schedule tylenol. Will add Aspercreme/Ice prn, may worsen with prednisone taper 4. Mood:LCSW to follow for evaluation and support. 5. Neuropsych: This patientiscapable of making decisions on herown behalf. 6. Skin/Wound Care:Routine pressure relief measures 7. Fluids/Electrolytes/Nutrition:Monitor I's and O's. Check BMP in a.m. 8. T2DM:Monitor blood sugars AC at bedtime.  Continue metformin daily with sliding scale insulin for elevated blood sugars. 9. VJD:YNXGZFP blood pressures twice daily. Continue Avapro daily 10 GERD:Managed with Protonix 11. Asthma with DOE/trachel stenosis:Continue duo nebs 4 times daily.Slowsteroid taper recommended. 12.History of depression/anxiety:Continue Lexapro. Continue Ativan as needed for increased anxiety likely due to steroids. 13. Leucocytosis: Likely due to steroids--monitor for signs of infection.  LOS (Days) 1 A FACE TO FACE EVALUATION WAS PERFORMED  Charlett Blake 04/04/2018, 9:43 AM

## 2018-04-04 NOTE — IPOC Note (Addendum)
Overall Plan of Care [facility]) Patient Details Name: Caitlyn Anderson MRN: [id number] DOB: 1961/05/04  Admitting Diagnosis: <principal problem not specified>  Hospital Problems: Active Problems:   Stridor   Debility     Functional Problem List: Nursing Endurance, Medication Management, Safety, Skin Integrity  PT Balance, Pain, Endurance, Motor  OT Balance, Endurance, Motor, Pain, Safety  SLP    TR         Basic ADL's: OT Grooming, Bathing, Dressing, Toileting     Advanced  ADL's: OT Simple Meal Preparation, Light Housekeeping     Transfers: PT Bed Mobility, Bed to Chair, Car, Manufacturing systems engineer, Metallurgist: PT Ambulation, Stairs     Additional Impairments: OT    SLP        TR      Anticipated Outcomes Item Anticipated Outcome  Self Feeding    Swallowing      Basic self-care  Mod I  Toileting  Mod I   Bathroom Transfers Mod I  Bowel/Bladder  Supervision  Transfers  mod I with LRAD  Locomotion  mod I with LRAD except supervision for stair negotiation without rails but with LRAD  Communication     Cognition     Pain  <3 on a 1-10 pain scale  Safety/Judgment  Supervision assistance and chair alarm while sitting in chair and bed alarm while in bed   Therapy Plan: PT Intensity: Minimum of 1-2 x/day ,45 to 90 minutes PT Frequency: 5 out of 7 days PT Duration Estimated Length of Stay: 1 week OT Intensity: Minimum of 1-2 x/day, 45 to 90 minutes OT Frequency: 5 out of 7 days OT Duration/Estimated Length of Stay: 4-6 days      Team Interventions: Nursing Interventions Patient/Family Education, Medication Management, Discharge Planning, Psychosocial Support, Disease Management/Prevention  PT interventions Ambulation/gait training, Discharge planning, DME/adaptive equipment instruction, Functional mobility training, Pain management, Psychosocial support, Therapeutic Activities, UE/LE Strength taining/ROM, UE/LE Coordination activities,  Therapeutic Exercise, Stair training, Patient/family education, Neuromuscular re-education, Disease management/prevention, Academic librarian, Training and development officer  OT Interventions Training and development officer, Academic librarian, Discharge planning, Disease mangement/prevention, Engineer, drilling, Functional mobility training, Pain management, Patient/family education, Psychosocial support, Self Care/advanced ADL retraining, Therapeutic Activities, Therapeutic Exercise, UE/LE Strength taining/ROM  SLP Interventions    TR Interventions    SW/CM Interventions Discharge Planning, Psychosocial Support, Patient/Family Education   Barriers to Discharge MD  Medical stability and pain from Left hip OA, awaiting Replacement   Nursing      PT Decreased caregiver support, Home environment access/layout, Lack of/limited family support husband works 2 days on, 2 days off, pt has 3 steps without rails to enter home  OT      SLP      SW       Team Discharge Planning: Destination: PT-Home ,OT- Home , SLP-  Projected Follow-up: PT-Outpatient PT, OT-  None, SLP-  Projected Equipment Needs: PT-To be determined, OT- Tub/shower bench, SLP-  Equipment Details: PT- , OT-  Patient/family involved in discharge planning: PT- Patient,  OT-Patient, SLP-   MD ELOS: 7d Medical Rehab Prognosis:  Excellent Assessment:  57 year old female with history of T2DM, GERD, asthma history with DOE and 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 She was treated with Solu-Medrol and nebs with improvement in respiratory status.  PCCM following for input and has signed off as respiratory status has improved. Therapy evaluations done with patient limited by left hip pain  with unsteady gait as well as shortness of breath. CIR recommended due to functional deficits  Now requiring 24/7 Rehab RN,MD, as well as CIR level PT, OT and SLP.  Treatment team will focus on ADLs and mobility with goals set at Mod I  See Team Conference Notes for weekly updates to the plan of care

## 2018-04-04 NOTE — H&P (Signed)
H and P complete while pt awaiting transfer tp Rehab from [facility] [address].  Met and discussed case with CVTS Dr Roxan Hockey See full History and Physical completed 556p

## 2018-04-04 NOTE — Progress Notes (Signed)
Physical Medicine and Rehabilitation Consult   Reason for Consult: Debility/Endstage OA left hip Referring Physician: Dr. Thereasa Solo    HPI: Caitlyn Anderson is a 57 y.o. 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 She was admitted via [facility] on 03/29/2018 with productive cough, shortness of breath and fevers.  Patient noted to have expiratory stridor and CT chest showed subglottic tracheal stenosis to 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  She was started on Solu-Medrol and nebs with improvement in respiratory status.  PCCM following closely to monitor for any ventilator needs.  His respiratory status reported to be improving but she continues to be limited by left hip pain as well as unsteady gait. Patient's left hip replacement has been delayed because of the surgical risk surrounding her tracheal stenosis. CIR recommended by therapy   Review of Systems  Constitutional: Positive for malaise/fatigue. Negative for chills and fever.  HENT: Negative for hearing loss and tinnitus.   Eyes: Negative for blurred vision and double vision.  Respiratory: Positive for shortness of breath and wheezing. Negative for sputum production.   Cardiovascular: Negative for chest pain and palpitations.  Gastrointestinal: Positive for diarrhea (due to metformin) and heartburn.  Genitourinary: Negative for dysuria and urgency.  Musculoskeletal: Positive for joint pain (needs left hip replacement. ).  Neurological: Negative for dizziness.  Psychiatric/Behavioral: Negative for memory loss.          Past Medical History:  Diagnosis Date  . Allergic rhinitis   . Asthma   . 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 Dyspnea   . Family history of adverse reaction to anesthesia   . GERD (gastroesophageal reflux disease)   . Hyperlipidemia   . Hypertension   . Hypothyroidism   . Left  bundle branch block          Past Surgical History:  Procedure Laterality Date  . BREAST SURGERY Left    lumpectomy  . CESAREAN SECTION    . COLONOSCOPY    . DILATION AND CURETTAGE OF UTERUS    . FLEXIBLE BRONCHOSCOPY N/A 03/24/2018   Procedure: FLEXIBLE BRONCHOSCOPY;  Surgeon: Melrose Nakayama, MD;  Location: [facility];  Service: Thoracic;  Laterality: N/A;  . LASER BRONCHOSCOPY N/A 03/24/2018   Procedure: LASER BRONCHOSCOPY FOR RESECTION OF TRACHEAL WEB;  Surgeon: Melrose Nakayama, MD;  Location: [facility] OR;  Service: Thoracic;  Laterality: N/A;  . TONSILLECTOMY    . VIDEO BRONCHOSCOPY Bilateral 02/03/2018   Procedure: VIDEO BRONCHOSCOPY WITHOUT FLUORO;  Surgeon: Collene Gobble, MD;  Location: [facility] ENDOSCOPY;  Service: Cardiopulmonary;  Laterality: Bilateral;         Family History  Problem Relation Age of Onset  . Glaucoma Father   . Macular degeneration Maternal Grandmother      Social History:  Married. Lives with husband and son who are out of the home during the day. She was independent and works part time as a Network engineer. She reports that she has never smoked. She has never used smokeless tobacco. She reports that she drinks alcohol about twice a year. She reports that she has current or past drug history.          Allergies  Allergen Reactions  . Penicillins Hives and Rash    Has patient had a PCN reaction causing immediate rash, facial/tongue/throat swelling, SOB or lightheadedness with hypotension: No Has patient had a PCN  reaction causing severe rash involving mucus membranes or skin necrosis: No Has patient had a PCN reaction that required hospitalization: No Has patient had a PCN reaction occurring within the last 10 years: No If all of the above answers are "NO", then may proceed with Cephalosporin use.           Medications Prior to Admission  Medication Sig Dispense Refill  . albuterol (PROAIR HFA) 108 (90 Base) MCG/ACT inhaler  Inhale 2 puffs into the lungs 4 (four) times daily as needed for wheezing or shortness of breath.     . ALPRAZolam (XANAX) 0.25 MG tablet Take 0.25 mg by mouth daily as needed for anxiety.    Marland Kitchen aspirin 81 MG tablet Take 81 mg by mouth daily.     Marland Kitchen atorvastatin (LIPITOR) 20 MG tablet Take 20 tablets by mouth daily.     . cetirizine (ZYRTEC) 10 MG tablet Take 10 mg by mouth at bedtime.     . Cholecalciferol 2000 units TABS Take 2,000 Units by mouth daily.     Marland Kitchen escitalopram (LEXAPRO) 10 MG tablet Take 10 mg by mouth daily.     Marland Kitchen esomeprazole (NEXIUM) 20 MG capsule Take 40 mg by mouth daily at 12 noon.    . fluticasone (FLONASE) 50 MCG/ACT nasal spray Inhale 1 spray into the lungs daily.    . Hypromellose 0.3 % SOLN Place 1 application into both eyes at bedtime.    Marland Kitchen levothyroxine (SYNTHROID, LEVOTHROID) 75 MCG tablet Take 75 mcg by mouth daily before breakfast.    . metFORMIN (GLUCOPHAGE) 1000 MG tablet Take 1,000 mg by mouth daily.     . valsartan (DIOVAN) 160 MG tablet Take 1 tablet (160 mg total) by mouth daily. 31 tablet 4  . acetaminophen (TYLENOL) 500 MG tablet Take 1,000 mg by mouth every 6 (six) hours as needed for mild pain.      Home: Home Living Family/patient expects to be discharged to:: Private residence Living Arrangements: Spouse/significant other Available Help at Discharge: Available PRN/intermittently Type of Home: House Home Access: Stairs to enter Technical brewer of Steps: 3 Entrance Stairs-Rails: None Home Layout: One level Bathroom Shower/Tub: Chiropodist: Standard Bathroom Accessibility: Yes Home Equipment: [place] - single point  Functional History: Prior Function Level of Independence: Independent with assistive device(s) Comments: limited community ambulation due to hip pain and decreased breathing, independent with bathing and dressing increased time and effort  Functional Status:  Mobility: Bed  Mobility Overal bed mobility: Modified Independent General bed mobility comments: increased time and effort to reach EoB Transfers Overall transfer level: Needs assistance Equipment used: Rolling walker (2 wheeled) Transfers: Sit to/from Stand Sit to Stand: Min assist General transfer comment: minA for steadying with RW, good powerup  Ambulation/Gait Ambulation/Gait assistance: Min Web designer (Feet): 35 Feet Assistive device: Rolling walker (2 wheeled) Gait Pattern/deviations: Decreased step length - right, Decreased stance time - left, Decreased stride length, Shuffle, Antalgic, Step-through pattern General Gait Details: minA for steadying with RW, increased pain in L hip with ambulation, vc for use of RW to offload L hip,  Gait velocity: slowed Gait velocity interpretation: <1.8 ft/sec, indicate of risk for recurrent falls  ADL:  Cognition: Cognition Overall Cognitive Status: Within Functional Limits for tasks assessed Orientation Level: Oriented X4 Cognition Arousal/Alertness: Awake/alert Behavior During Therapy: WFL for tasks assessed/performed Overall Cognitive Status: Within Functional Limits for tasks assessed   Blood pressure (!) 156/77, pulse 93, temperature 98.2 F (36.8 C), temperature source Oral,  resp. rate 14, height 5' (1.524 m), weight 89.2 kg, SpO2 96 %. Physical Exam  Nursing note and vitals reviewed. Constitutional: She is oriented to person, place, and time. She appears well-developed.  Sitting up in chair. Pleasant and appropriate. Occasional stridor noted with conversation.   HENT:  Head: Normocephalic.  Eyes: Pupils are equal, round, and reactive to light.  Neck: Normal range of motion.  Cardiovascular: Normal rate.  Respiratory: She has wheezes (expiratory).  Stridor, voice hoarse at times. No respiratory stress  GI: Soft.  Musculoskeletal:  Left hip limited by pain  Neurological: She is alert and oriented to person, place, and time.   UE 4/5. RLE 4-/5 prox to 5/5 distally. LLE: 2+ HF, 4- KE and 4+ ADF/PF. No gross sensory findings  Skin: Skin is warm.  Psychiatric: She has a normal mood and affect. Her behavior is normal.  Assessment/Plan: Diagnosis: Functional and mobility deficits secondary to end-stage OA of the left hip and debility related to tracheal stenosis/respiratory failure 1. Does the need for close, 24 hr/day medical supervision in concert with the patient's rehab needs make it unreasonable for this patient to be served in a less intensive setting? Yes 2. Co-Morbidities requiring supervision/potential complications: pain mgt, pulmonary considerations,  3. Due to bladder management, bowel management, safety, skin/wound care, disease management, medication administration, pain management and patient education, does the patient require 24 hr/day rehab nursing? Yes 4. Does the patient require coordinated care of a physician, rehab nurse, PT (1-2 hrs/day, 5 days/week) and OT (1-2 hrs/day, 5 days/week) to address physical and functional deficits in the context of the above medical diagnosis(es)? Yes Addressing deficits in the following areas: balance, endurance, locomotion, strength, transferring, bowel/bladder control, bathing, dressing, feeding, grooming, toileting and psychosocial support 5. Can the patient actively participate in an intensive therapy program of at least 3 hrs of therapy per day at least 5 days per week? Yes 6. The potential for patient to make measurable gains while on inpatient rehab is excellent 7. Anticipated functional outcomes upon discharge from inpatient rehab are modified independent  with PT, modified independent with OT, n/a with SLP. 8. Estimated rehab length of stay to reach the above functional goals is: 7 days 9. Anticipated D/C setting: Home 10. Anticipated post D/C treatments: [place] therapy 11. Overall Rehab/Functional Prognosis: excellent  RECOMMENDATIONS: This patient's condition is  appropriate for continued rehabilitative care in the following setting: CIR Patient has agreed to participate in recommended program. Yes Note that insurance prior authorization may be required for reimbursement for recommended care.  Comment: Pt would benefit from the intensity of our program to push her to a modified independent level of function while aggressively managing pain and pulmonary issues concomitantly.   Her husband works, and she needs to be independent during the day at the very least. Patient is motivated to work with therapies. Rehab Admissions Coordinator to follow up.  Thanks,  Meredith Staggers, MD, Mellody Drown  I have personally performed a face to face diagnostic evaluation of this patient. Additionally, I have reviewed and concur with the physician assistant's documentation above.    Bary Leriche, PA-C 04/01/2018        Revision History                        Routing History

## 2018-04-04 NOTE — Progress Notes (Signed)
Physical Therapy Session Note  Patient Details  Name: Caitlyn Anderson MRN: [id number] Date of Birth: [birth year]  Today's Date: 04/04/2018 PT Individual Time: 1100-1152 PT Individual Time Calculation (min): 52 min   Short Term Goals: Week 1:  PT Short Term Goal 1 (Week 1): STG = LTG due to short ELOS.  Skilled Therapeutic Interventions/Progress Updates:   Pt in recliner and agreeable to therapy, ongoing L hip pain w/ activity and premedicated. Pt ambulated around unit w/ supervision using rollator, >150' at a time, w/ intermittent rest breaks 2/2 fatigue and increased work of breathing. Performed Berg Balance Scale as detailed below and explained significance of results to pt including increased fall risk and importance of using AD at this time. Performed NuStep @ level 3, x10 min, for LE strengthening and endurance training. Intermittent verbal cues for breathing strategies, although pt notes her breathing has much improved since admission to hospital. Returned to room and assisted w/ toileting, supervision for LE garment management and pericare. Assessed bed mobility prior to ended session in recliner, call bell in reach and all needs met. Ice applied to L hip to minimize soreness after therapist session.   Therapy Documentation Precautions:  Restrictions Weight Bearing Restrictions: No Pain: Pain Assessment Pain Scale: 0-10 Pain Score: 0-No pain Balance: Standardized Balance Assessment Standardized Balance Assessment: Berg Balance Test Berg Balance Test Sit to Stand: Able to stand without using hands and stabilize independently Standing Unsupported: Able to stand safely 2 minutes Sitting with Back Unsupported but Feet Supported on Floor or Stool: Able to sit safely and securely 2 minutes Stand to Sit: Sits safely with minimal use of hands Transfers: Able to transfer safely, definite need of hands Standing Unsupported with Eyes Closed: Able to stand 10 seconds with supervision Standing  Ubsupported with Feet Together: Able to place feet together independently and stand for 1 minute with supervision From Standing, Reach Forward with Outstretched Arm: Can reach confidently >25 cm (10") From Standing Position, Pick up Object from Floor: Able to pick up shoe, needs supervision From Standing Position, Turn to Look Behind Over each Shoulder: Turn sideways only but maintains balance Turn 360 Degrees: Able to turn 360 degrees safely but slowly Standing Unsupported, Alternately Place Feet on Step/Stool: Able to complete 4 steps without aid or supervision(more limited by L hip pain w/ activity) Standing Unsupported, One Foot in Front: Able to plae foot ahead of the other independently and hold 30 seconds Standing on One Leg: Able to lift leg independently and hold equal to or more than 3 seconds Total Score: 43  See Function Navigator for Current Functional Status.   Therapy/Group: Individual Therapy  [name] 04/04/2018, 11:10 AM

## 2018-04-04 NOTE — Progress Notes (Signed)
Occupational Therapy Session Note  Patient Details  Name: Caitlyn Anderson MRN: [id number] Date of Birth: [birth year]  Today's Date: 04/04/2018 OT Individual Time: 1430-1500 OT Individual Time Calculation (min): 30 min    Short Term Goals: Week 1:  OT Short Term Goal 1 (Week 1): STG = LTGs due to ELOS  Skilled Therapeutic Interventions/Progress Updates:  Treatment session with focus on functional mobility with Rollator in home environment.  Pt ambulated to ADL apt with Rollator with min guard to supervision.  Completed furniture transfers to low couch and recliner with min guard from couch and supervision from recliner.  Educated on tub/shower transfer technique with tub bench, pt able to complete with min guard.  Engaged in discussion regarding safety with meal prep and laundry tasks with use of AD with plan to further assess.  Pt returned to room as above and left seated in recliner with all needs in reach.  Therapy Documentation Precautions:  Precautions Precautions: Fall Precaution Comments: dyspneic with minimal exertion Restrictions Weight Bearing Restrictions: No General:   Vital Signs: Therapy Vitals Temp: 98 F (36.7 C) Temp Source: Oral Pulse Rate: 74 Resp: 18 BP: 110/68 Patient Position (if appropriate): Sitting Oxygen Therapy SpO2: 95 % O2 Device: Room Air Pain: Pain Assessment Pain Scale: 0-10 Pain Score: 0-No pain  See Function Navigator for Current Functional Status.   Therapy/Group: Individual Therapy  Simonne Come 04/04/2018, 4:11 PM

## 2018-04-04 NOTE — Plan of Care (Signed)
  Problem: Consults Goal: RH GENERAL PATIENT EDUCATION Description See Patient Education module for education specifics. Outcome: Completed/Met Goal: Diabetes Guidelines if Diabetic/Glucose > 140 Description If diabetic or lab glucose is > 140 mg/dl - Initiate Diabetes/Hyperglycemia Guidelines & Document Interventions  Outcome: Completed/Met   Problem: RH SAFETY Goal: RH STG ADHERE TO SAFETY PRECAUTIONS W/ASSISTANCE/DEVICE Description STG Adhere to Safety Precautions With supervision Assistance and appropriate assistive Device.  Outcome: Completed/Met   Problem: RH PAIN MANAGEMENT Goal: RH STG PAIN MANAGED AT OR BELOW PT'S PAIN GOAL Description <3 on a 1-10 pain scale.  Outcome: Completed/Met

## 2018-04-05 ENCOUNTER — Inpatient Hospital Stay (HOSPITAL_COMMUNITY): Payer: BLUE CROSS/BLUE SHIELD

## 2018-04-05 ENCOUNTER — Inpatient Hospital Stay (HOSPITAL_COMMUNITY): Payer: BLUE CROSS/BLUE SHIELD | Admitting: Physical Therapy

## 2018-04-05 DIAGNOSIS — F411 Generalized anxiety disorder: Secondary | ICD-10-CM

## 2018-04-05 LAB — GLUCOSE, CAPILLARY
GLUCOSE-CAPILLARY: 200 mg/dL — AB (ref 70–99)
GLUCOSE-CAPILLARY: 171 mg/dL — AB (ref 70–99)
Glucose-Capillary: 101 mg/dL — ABNORMAL HIGH (ref 70–99)
Glucose-Capillary: 123 mg/dL — ABNORMAL HIGH (ref 70–99)

## 2018-04-05 MED ORDER — TRAZODONE HCL 50 MG PO TABS
50.0000 mg | ORAL_TABLET | Freq: Every day | ORAL | Status: DC
  Administered 2018-04-05 – 2018-04-07 (×3): 50 mg via ORAL
  Filled 2018-04-05 (×3): qty 1

## 2018-04-05 NOTE — Plan of Care (Signed)
  Problem: Consults Goal: RH GENERAL PATIENT EDUCATION Description See Patient Education module for education specifics.  04/05/2018 1330 by Brita Romp, RN Outcome: Progressing 04/05/2018 1329 by Brita Romp, RN Reactivated Goal: Diabetes Guidelines if Diabetic/Glucose > 140 Description If diabetic or lab glucose is > 140 mg/dl - Initiate Diabetes/Hyperglycemia Guidelines & Document Interventions   04/05/2018 1330 by Brita Romp, RN Outcome: Progressing 04/05/2018 1329 by Brita Romp, RN Reactivated   Problem: RH SAFETY Goal: RH STG ADHERE TO SAFETY PRECAUTIONS W/ASSISTANCE/DEVICE Description STG Adhere to Safety Precautions With supervision Assistance and appropriate assistive Device.   04/05/2018 1330 by Brita Romp, RN Outcome: Progressing 04/05/2018 1329 by Brita Romp, RN Reactivated   Problem: RH PAIN MANAGEMENT Goal: RH STG PAIN MANAGED AT OR BELOW PT'S PAIN GOAL Description <3 on a 1-10 pain scale.   04/05/2018 1330 by Brita Romp, RN Outcome: Progressing 04/05/2018 1329 by Brita Romp, RN Reactivated

## 2018-04-05 NOTE — Progress Notes (Signed)
Physical Therapy Session Note  Patient Details  Name: Caitlyn Anderson MRN: [id number] Date of Birth: 09-May-1961  Today's Date: 04/05/2018 PT Individual Time: 1500-1600 PT Individual Time Calculation (min): 60 min   Short Term Goals: Week 1:  PT Short Term Goal 1 (Week 1): STG = LTG due to short ELOS.  Skilled Therapeutic Interventions/Progress Updates:   Pt received sitting in WC and agreeable to PT.   PT instructed pt in gait training with with rollator 170f x 2 with distant supervision assist for safety. Dynamic gait training to weave through 5 cones x 4 with supervision assist from PT and min cues for AD management. Simulated threshold management to ambulate over 1 inch walking stick with Rollator. Moderate cues for safety, and technique to prevent LOB and limit # of wheels off ground.   Sit<>supine to mat table with supervision assist from PT. PT instructed pt in BLE therex at bed level: Supine: hip abduction, reciprocal marches, SLR, SAQ with resistance, ankle PF with resistance, bridge Sidelying: clam shells and hip abduction.  All therex completed x 12 BLE with cues for proper ROM and decreased speed of movement in eccentric direction. Pt reports mild hip pain on the L following therex.   PT performed 4 bout of continuous distraction at the hip and 3 bout and grade 3 inferior mobilizations x 30 sec to improve joint ROM and decrease pain. Mild decrease in pain reported by pt.   [facility] returned to [facility] and [facility] and all needs met.          Therapy Documentation Precautions:  Precautions Precautions: Fall Precaution Comments: dyspneic with minimal exertion Restrictions Weight Bearing Restrictions: No Vital Signs: Therapy Vitals Temp: (!) 97.4 F (36.3 C) Pulse Rate: 84 Resp: 20 BP: 131/84 Patient Position (if appropriate): Sitting Oxygen Therapy SpO2: 98 % O2 Device: Room Air Pain:   denies at rest   See Function Navigator  for Current Functional Status.   Therapy/Group: Individual Therapy  ALorie Phenix9/28/2019, 5:56 PM

## 2018-04-05 NOTE — Progress Notes (Signed)
Subjective/Complaints: Overall happy with progress.  Still with some shortness of breath at times.  Having some anxiety at night which tends to make that worse and affect her sleep.  Asked if she can go off the unit at times.  ROS: Patient denies fever, rash, sore throat, blurred vision, nausea, vomiting, diarrhea, cough,   chest pain, joint or back pain, headache, or mood change.   Objective: Vital Signs: Blood pressure 132/81, pulse 68, temperature 97.6 F (36.4 C), temperature source Oral, resp. rate 16, height 5' (1.524 m), weight 86.5 kg, SpO2 98 %. No results found. Results for orders placed or performed during the hospital encounter of 04/03/18 (from the past 72 hour(s))  CBC WITH DIFFERENTIAL     Status: Abnormal   Collection Time: 04/04/18  6:42 AM  Result Value Ref Range   WBC 17.4 (H) 4.0 - 10.5 K/uL   RBC 4.46 3.87 - 5.11 MIL/uL   Hemoglobin 14.3 12.0 - 15.0 g/dL   HCT 42.6 36.0 - 46.0 %   MCV 95.5 78.0 - 100.0 fL   MCH 32.1 26.0 - 34.0 pg   MCHC 33.6 30.0 - 36.0 g/dL   RDW 12.5 11.5 - 15.5 %   Platelets 306 150 - 400 K/uL   Neutrophils Relative % 65 %   Neutro Abs 11.5 (H) 1.7 - 7.7 K/uL   Lymphocytes Relative 24 %   Lymphs Abs 4.1 (H) 0.7 - 4.0 K/uL   Monocytes Relative 8 %   Monocytes Absolute 1.4 (H) 0.1 - 1.0 K/uL   Eosinophils Relative 1 %   Eosinophils Absolute 0.2 0.0 - 0.7 K/uL   Basophils Relative 1 %   Basophils Absolute 0.1 0.0 - 0.1 K/uL   Immature Granulocytes 1 %   Abs Immature Granulocytes 0.2 (H) 0.0 - 0.1 K/uL    Comment: Performed at [facility], [address], [place], [place] [address]  Comprehensive metabolic panel     Status: Abnormal   Collection Time: 04/04/18  6:42 AM  Result Value Ref Range   Sodium 137 135 - 145 mmol/L   Potassium 3.6 3.5 - 5.1 mmol/L   Chloride 99 98 - 111 mmol/L   CO2 28 22 - 32 mmol/L   Glucose, Bld 131 (H) 70 - 99 mg/dL   BUN 20 6 - 20 mg/dL   Creatinine, Ser 0.92 0.44 - 1.00 mg/dL   Calcium 9.8 8.9 -  10.3 mg/dL   Total Protein 6.8 6.5 - 8.1 g/dL   Albumin 3.5 3.5 - 5.0 g/dL   AST 17 15 - 41 U/L   ALT 22 0 - 44 U/L   Alkaline Phosphatase 61 38 - 126 U/L   Total Bilirubin 0.8 0.3 - 1.2 mg/dL   GFR calc non Af Amer >60 >60 mL/min   GFR calc Af Amer >60 >60 mL/min    Comment: (NOTE) The eGFR has been calculated using the CKD EPI equation. This calculation has not been validated in all clinical situations. eGFR's persistently <60 mL/min signify possible Chronic Kidney Disease.    Anion gap 10 5 - 15    Comment: Performed at [facility] [address], [place], [place] [address]  Glucose, capillary     Status: Abnormal   Collection Time: 04/04/18  7:39 AM  Result Value Ref Range   Glucose-Capillary 170 (H) 70 - 99 mg/dL  Glucose, capillary     Status: Abnormal   Collection Time: 04/04/18 11:57 AM  Result Value Ref Range  Glucose-Capillary 116 (H) 70 - 99 mg/dL  Glucose, capillary     Status: Abnormal   Collection Time: 04/04/18  5:03 PM  Result Value Ref Range   Glucose-Capillary 186 (H) 70 - 99 mg/dL  Glucose, capillary     Status: Abnormal   Collection Time: 04/04/18  9:08 PM  Result Value Ref Range   Glucose-Capillary 126 (H) 70 - 99 mg/dL   Comment 1 Notify RN   Glucose, capillary     Status: Abnormal   Collection Time: 04/05/18  6:28 AM  Result Value Ref Range   Glucose-Capillary 101 (H) 70 - 99 mg/dL   Comment 1 Notify RN      Constitutional: No distress . Vital signs reviewed. HEENT: EOMI, oral membranes moist Neck: supple Cardiovascular: RRR without murmur. No JVD    Respiratory: Mild dyspnea with conversation t    GI: BS +, non-tender, non-distended  Extremity:  Pulses positive and No Edema Skin:   Intact Neuro: Alert/Oriented, Normal Sensory and Normal Motor Musc/Skel:  Other Left hip pain and contracture with int/ext rotation Gen NAD   Assessment/Plan: 1. 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 Physiatrist is providing close team supervision and 24 hour management of active medical problems listed below. Physiatrist and rehab team continue to assess barriers to discharge/monitor patient progress toward functional and medical goals. FIM:                   Function - Comprehension Comprehension: Auditory Comprehension assist level: Follows complex conversation/direction with extra time/assistive device  Function - Expression Expression: Verbal Expression assist level: Expresses complex ideas: With extra time/assistive device  Function - Social Interaction Social Interaction assist level: Interacts appropriately with others - No medications needed.  Function - Problem Solving Problem solving assist level: Solves complex problems: With extra time  Function - Memory Memory assist level: Complete Independence: No helper Patient normally able to recall (first 3 days only): Current season, Location of own room, Staff names and faces, That he or she is in a hospital  Medical Problem List and Plan: 1. 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  -Continue therapies  2. DVT Prophylaxis/Anticoagulation: Pharmaceutical:Lovenox 3.Endstage OA left hip/Pain Management:Schedule tylenol. Will add Aspercreme/Ice prn, may worsen with prednisone taper 4. Mood:LCSW to follow for evaluation and support.  -Encouraged use of Ativan at night to help with anxiety  -Has trazodone ordered as well.  We will go ahead and schedule this at bedtime  -Discussed ways to improve sleep hygiene  -[place] 5. Neuropsych: This patientiscapable of making decisions on herown behalf. 6. Skin/Wound Care:Routine pressure relief measures 7. Fluids/Electrolytes/Nutrition:Monitor I's and O's. Check BMP in a.m. 8. T2DM:Monitor blood sugars AC at bedtime.  Continue metformin daily with sliding scale insulin for elevated blood sugars.  -Fair control at present 9. MHD:QQIWLNL blood pressures twice daily. Continue Avapro daily  -Controlled 9/28 10 GERD:Managed with Protonix 11. Asthma with DOE/trachel stenosis:Continue duo nebs 4 times daily.Slowsteroid taper recommended. 12.History of depression/anxiety:Continue Lexapro. Continue Ativan as needed for increased anxiety likely due to steroids. 13. Leucocytosis: Likely due to steroids--monitor for signs of infection.  LOS (Days) 2 A FACE TO FACE EVALUATION WAS PERFORMED  Meredith Staggers 04/05/2018, 8:29 AM

## 2018-04-05 NOTE — Progress Notes (Signed)
  Subjective: Feels better overall Says she thinks her breathing was a little better yesterday than today  Objective: Vital signs in last 24 hours: Temp:  [97.6 F (36.4 C)-98 F (36.7 C)] 97.6 F (36.4 C) (09/28 0509) Pulse Rate:  [66-74] 68 (09/28 0509) Resp:  [16-18] 16 (09/28 0509) BP: (110-134)/(68-81) 132/81 (09/28 0509) SpO2:  [95 %-98 %] 98 % (09/28 0509)  Hemodynamic parameters for last 24 hours:    Intake/Output from previous day: 09/27 0701 - 09/28 0700 In: 840 [P.O.:840] Out: -  Intake/Output this shift: Total I/O In: 240 [P.O.:240] Out: -   General appearance: alert, cooperative and no distress Lungs: clear to auscultation bilaterally no stridor  Lab Results: Recent Labs    04/03/18 0313 04/04/18 0642  WBC 15.4* 17.4*  HGB 13.1 14.3  HCT 39.9 42.6  PLT 302 306   BMET:  Recent Labs    04/03/18 0313 04/04/18 0642  NA 135 137  K 4.2 3.6  CL 100 99  CO2 27 28  GLUCOSE 176* 131*  BUN 18 20  CREATININE 0.86 0.92  CALCIUM 9.3 9.8    PT/INR: No results for input(s): LABPROT, INR in the last 72 hours. ABG No results found for: PHART, HCO3, TCO2, ACIDBASEDEF, O2SAT CBG (last 3)  Recent Labs    04/04/18 1703 04/04/18 2108 04/05/18 0628  GLUCAP 186* 126* 101*    Assessment/Plan: She looks great today Her respiratory status is much improved from admission and on exam it sounds like she is moving more air with less stridor Continue slow prednisone taper   LOS: 2 days    Melrose Nakayama 04/05/2018

## 2018-04-05 NOTE — Progress Notes (Signed)
Occupational Therapy Session Note  Patient Details  Name: Caitlyn Anderson MRN: [id number] Date of Birth: October 05, 1960  Today's Date: 04/05/2018 OT Individual Time: 9977-4142 Session 2: 1300-1400 OT Individual Time Calculation (min): 75 min 60 min   Short Term Goals: Week 1:  OT Short Term Goal 1 (Week 1): STG = LTGs due to ELOS  Skilled Therapeutic Interventions/Progress Updates:    Pt supine in bed agreeable to therapy with no c/o pain. Session focused on b/d tasks, dynamic standing balance, and functional activity tolerance. Pt completed shower level bathing with (S), standing with posterior support on shower chair. Vc provided to don/doff clothing sitting to reduce fall risk. Pt donned L sock with use of sock aid, with demo provided. Pt edu re upcoming hip replacement and potential precautions. Pt practiced using reacher to don/doff pant leg. Pt completed functional mobility with rollator down to therapy gym with (S). Pt completed sit to stand transfer from edge of mat with no UE support standing on foam block to challenge balance during transitional movement. Pt stood for 10+ minutes on foam block playing game with (S). Pt completed theraband scapular retraction exercises to promote upright posture with tactile cues provided for muscle activation. Pt then completed 8 min on the NuStep with interval training to challenge cardiorespiratory endurance. Pt returned to room and was left sitting up in recliner with all needs met.   Session 2: Session focused on functional activity tolerance, community reintegration, and functional mobility. No c/o pain throughout session. Pt completed 200 ft of functional mobility with rollator x2, with intermittent vc for rollator management and positioning. Pt used rollator to navigate through crowded gift shop, with edu provided re energy conservation and fall risk reduction in the community. Pt required 2 seated rest breaks during task. Pt completed functional mobility  around kitchen and prepared simple meal. Pt demo good safety awareness during task and was provided with edu throughout re energy conservation in the kitchen. Pt completed biodex dynamic balance training- pt initially scored 19% on limits of stability training, but with 2x practice and tactile cues was able to raise accuracy to 56%. Pt returned to room and was left sitting up in recliner with all needs met.   Therapy Documentation Precautions:  Precautions Precautions: Fall Precaution Comments: dyspneic with minimal exertion Restrictions Weight Bearing Restrictions: No    Pain: Pain Assessment Pain Scale: 0-10 Pain Score: 0-No pain ADL: ADL Grooming: Contact guard Where Assessed-Grooming: Standing at sink Upper Body Bathing: Contact guard Where Assessed-Upper Body Bathing: Shower Lower Body Bathing: Contact guard Where Assessed-Lower Body Bathing: Shower Upper Body Dressing: Setup Where Assessed-Upper Body Dressing: Chair Lower Body Dressing: Minimal assistance Where Assessed-Lower Body Dressing: Chair Toileting: Contact guard Where Assessed-Toileting: Glass blower/designer: Psychiatric nurse Method: Educational psychologist Method: Sit pivot(transfer bench) Tub/Shower Equipment: Facilities manager: Curator Method: Heritage manager: Customer service manager for Current Functional Status.   Therapy/Group: Individual Therapy  Curtis Sites 04/05/2018, 11:11 AM

## 2018-04-06 ENCOUNTER — Inpatient Hospital Stay (HOSPITAL_COMMUNITY): Payer: BLUE CROSS/BLUE SHIELD

## 2018-04-06 LAB — GLUCOSE, CAPILLARY
GLUCOSE-CAPILLARY: 109 mg/dL — AB (ref 70–99)
GLUCOSE-CAPILLARY: 207 mg/dL — AB (ref 70–99)
GLUCOSE-CAPILLARY: 132 mg/dL — AB (ref 70–99)
Glucose-Capillary: 160 mg/dL — ABNORMAL HIGH (ref 70–99)

## 2018-04-06 MED ORDER — SORBITOL 70 % SOLN
60.0000 mL | Status: AC
  Administered 2018-04-06: 60 mL via ORAL
  Filled 2018-04-06: qty 60

## 2018-04-06 NOTE — Progress Notes (Signed)
Social Work Social Work Assessment and Plan  Patient Details  Name: Caitlyn Anderson MRN: [id number] Date of Birth: [birth year]  Today's Date: 04/04/2018  Problem List:  Patient Active Problem List   Diagnosis Date Noted  . Debility   . Primary osteoarthritis of left hip   . Stridor   . 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 Tracheal stricture   . Dyspnea 01/28/2018  . Wheezing 12/16/2017   Past Medical History:  Past Medical History:  Diagnosis Date  . Allergic rhinitis   . Asthma   . 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 Dyspnea   . Family history of adverse reaction to anesthesia   . GERD (gastroesophageal reflux disease)   . Hyperlipidemia   . Hypertension   . Hypothyroidism   . Left bundle branch block    Past Surgical History:  Past Surgical History:  Procedure Laterality Date  . BREAST SURGERY Left    lumpectomy  . CESAREAN SECTION    . COLONOSCOPY    . DILATION AND CURETTAGE OF UTERUS    . FLEXIBLE BRONCHOSCOPY N/A 03/24/2018   Procedure: FLEXIBLE BRONCHOSCOPY;  Surgeon: Melrose Nakayama, MD;  Location: [facility];  Service: Thoracic;  Laterality: N/A;  . LASER BRONCHOSCOPY N/A 03/24/2018   Procedure: LASER BRONCHOSCOPY FOR RESECTION OF TRACHEAL WEB;  Surgeon: Melrose Nakayama, MD;  Location: [facility] OR;  Service: Thoracic;  Laterality: N/A;  . TONSILLECTOMY    . VIDEO BRONCHOSCOPY Bilateral 02/03/2018   Procedure: VIDEO BRONCHOSCOPY WITHOUT FLUORO;  Surgeon: Collene Gobble, MD;  Location: [facility] ENDOSCOPY;  Service: Cardiopulmonary;  Laterality: Bilateral;   Social History:  reports that she has never smoked. She has never used smokeless tobacco. She reports that she drank alcohol. She reports that she has current or past drug history.  Family / Support Systems Marital Status: Married How Long?: 30 years Patient Roles: Spouse, Parent, Other (Comment)(works at church) Spouse/Significant Other: Caitlyn Anderson - husband - [phone] Children: son who  lives with them and two other children Anticipated Caregiver: Patient with Mod I goals  Ability/Limitations of Caregiver: Spouse works 12 hours shifts  Caregiver Availability: Intermittent Family Dynamics: Pt reports a supportive family and church family.  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 Return to Work Plans: Pt would like to return to doing the things at the churches as she is able. Legal History/Current Legal Issues: none reported Guardian/Conservator: N/A - MD has determined that pt is capable of making her own decisions.   Abuse/Neglect Abuse/Neglect Assessment Can Be Completed: Yes Physical Abuse: Denies Verbal Abuse: Denies Sexual Abuse: Denies Exploitation of patient/patient's resources: Denies Self-Neglect: Denies  Emotional Status Pt's affect, behavior and adjustment status: Pt has a good attitude about her recovery and is motivated to get better.   Recent Psychosocial Issues: Pt admits to feeling overwhelmed and anxious at times, especially with not being allowed to move about the room herself. Psychiatric History: none reported, but pt is taking Lexapro Substance Abuse History: none reported  Patient / Family Perceptions, Expectations & Goals Pt/Family understanding of illness & functional limitations: Pt expressed a good understanding of her condition and limitations. Premorbid pt/family roles/activities: Pt likes to play the piano and crochet.  She is also learning how to play a new instrument. Anticipated changes in roles/activities/participation: Pt would like to resume  activities as she is able. Pt/family expectations/goals: Pt would like to be as independent as possible, specifically she'd like to be able to put her own sock and shoe on, but she realizes she may to wait until her hip  replacement.  CSW mentioned this to OT, Simonne Come, who plans to address this with pt.  Community Resources Express Scripts: None Premorbid Home Care/DME Agencies: None Transportation available at discharge: family Resource referrals recommended: Neuropsychology  Discharge Planning Living Arrangements: Spouse/significant other, Children Support Systems: Spouse/significant other, Children, Other relatives, Immunologist, Friends/neighbors Type of Residence: Private residence Insurance underwriter Resources: Multimedia programmer (specify)([employer]) Museum/gallery curator Resources: Employment, Family Support(husband is working) Museum/gallery curator Screen Referred: No Money Management: Patient, Spouse Does the patient have any problems obtaining your medications?: No Home Management: Pt and husband take care of the home together. Patient/Family Preliminary Plans: Pt plans to return to her home with her son or husband to assist her as needed, but mostly at the mod I level. Social Work Anticipated Follow Up Needs: HH/OP Expected length of stay: ~7days   Clinical Impression CSW met with pt to introduce self and role of CSW, as well at to complete assessment.  Pt is doing well overall and does not expect to need to be on CIR for long.  She has some family support, but feels comfortable with mod I goals.  Pt wants to be able to care for herself.  Pt is frustrated with not having the freedom to move about her room independently and this causes her some anxiety.  She understands rules/policies, however.  No current concerns/needs/questions.  CSW will continue to follow and assist as needed.  Leroy Trim, Silvestre Mesi 04/06/2018, 2:59 PM

## 2018-04-06 NOTE — Progress Notes (Signed)
Occupational Therapy Session Note  Patient Details  Name: Caitlyn Anderson MRN: [id number] Date of Birth: [birth year]  Today's Date: 04/06/2018 OT Individual Time: 7981-0254 OT Individual Time Calculation (min): 70 min    Short Term Goals: Week 1:  OT Short Term Goal 1 (Week 1): STG = LTGs due to ELOS  Skilled Therapeutic Interventions/Progress Updates:    1:1. No pain reported. Pt educated on AE shoe funnel and elastic laces applied to shoes. Pt able to don B socks and shoes with AE and min VC for technique. Pt completes simulated laundry with rolator at MOD I level ambulating with clothing on rolator, loading/unloading washer and dryer as well as folding in standing. Educated pt on reacher use for energy consevation and decreasing bending. tp playes Wii bowiling in standing without rest breaks with staggered stance and no LOB and supervision. Pt completes Wii balance board activites (ski, maze, and soccer ball heading) to practice lateral/anterior/posterior weight shifting with VC for not picking up feet and min A for balance. Pt toilets with supervision for 3/3 components. Exited session with p tseated in recliner, all light in reach, exit alarm on and all needs met.   Therapy Documentation Precautions:  Precautions Precautions: Fall Precaution Comments: dyspneic with minimal exertion Restrictions Weight Bearing Restrictions: No General:   Vital Signs:   Pain: Pain Assessment Pain Score: 0-No pain ADL: ADL Grooming: Contact guard Where Assessed-Grooming: Standing at sink Upper Body Bathing: Contact guard Where Assessed-Upper Body Bathing: Shower Lower Body Bathing: Contact guard Where Assessed-Lower Body Bathing: Shower Upper Body Dressing: Setup Where Assessed-Upper Body Dressing: Chair Lower Body Dressing: Minimal assistance Where Assessed-Lower Body Dressing: Chair Toileting: Contact guard Where Assessed-Toileting: Glass blower/designer: Hydrologist Method: Educational psychologist Method: Sit pivot(transfer bench) Tub/Shower Equipment: Facilities manager: Curator Method: Heritage manager: Educational psychologist   Exercises:   Other Treatments:    See Function Navigator for Current Functional Status.   Therapy/Group: Individual Therapy  Tonny Branch 04/06/2018, 3:04 PM

## 2018-04-06 NOTE — Progress Notes (Signed)
[facility] Individual Statement of Services  Patient Name:  Caitlyn Anderson  Date:  04/06/2018  Welcome to the [facility].  Our goal is to provide you with an individualized program based on your diagnosis and situation, designed to meet your specific needs.  With this comprehensive rehabilitation program, you will be expected to participate in at least 3 hours of rehabilitation therapies Monday-Friday, with modified therapy programming on the weekends.  Your rehabilitation program will include the following services:  Physical Therapy (PT), Occupational Therapy (OT), 24 hour per day rehabilitation nursing, Neuropsychology, Case Management (Social Worker), Rehabilitation Medicine, Nutrition Services and Pharmacy Services  Weekly team conferences will be held on Wednesdays to discuss your progress.  Your Social Worker will talk with you frequently to get your input and to update you on team discussions.  Team conferences with you and your family in attendance may also be held.  Expected length of stay: 4 to 7 days  Overall anticipated outcome: Modified independent with supervision for stairs  Depending on your progress and recovery, your program may change. Your Social Worker will coordinate services and will keep you informed of any changes. Your Social Worker's name and contact numbers are listed  below.  The following services may also be recommended but are not provided by the [facility] will be made to provide these services after discharge if needed.  Arrangements include referral to agencies that provide these services.  Your insurance has been verified to be:  [employer] Your primary doctor is:  Dr. Audery Amel Vasireddy  Pertinent information will be shared with your  doctor and your insurance company.  Social Worker:  Alfonse Alpers, LCSW  [phone] or ([phone]  Information discussed with and copy given to patient by: Trey Sailors, 04/06/2018, 2:00 PM

## 2018-04-06 NOTE — Progress Notes (Signed)
Subjective/Complaints: Overall had better night with trazodone. Complains of constipation  ROS: Patient denies fever, rash, sore throat, blurred vision, nausea, vomiting, diarrhea, cough, shortness of breath or chest pain, joint or back pain, headache, or mood change.    Objective: Vital Signs: Blood pressure 127/87, pulse 70, temperature 98.7 F (37.1 C), resp. rate 18, height 5' (1.524 m), weight 86.5 kg, SpO2 98 %. No results found. Results for orders placed or performed during the hospital encounter of 04/03/18 (from the past 72 hour(s))  CBC WITH DIFFERENTIAL     Status: Abnormal   Collection Time: 04/04/18  6:42 AM  Result Value Ref Range   WBC 17.4 (H) 4.0 - 10.5 K/uL   RBC 4.46 3.87 - 5.11 MIL/uL   Hemoglobin 14.3 12.0 - 15.0 g/dL   HCT 42.6 36.0 - 46.0 %   MCV 95.5 78.0 - 100.0 fL   MCH 32.1 26.0 - 34.0 pg   MCHC 33.6 30.0 - 36.0 g/dL   RDW 12.5 11.5 - 15.5 %   Platelets 306 150 - 400 K/uL   Neutrophils Relative % 65 %   Neutro Abs 11.5 (H) 1.7 - 7.7 K/uL   Lymphocytes Relative 24 %   Lymphs Abs 4.1 (H) 0.7 - 4.0 K/uL   Monocytes Relative 8 %   Monocytes Absolute 1.4 (H) 0.1 - 1.0 K/uL   Eosinophils Relative 1 %   Eosinophils Absolute 0.2 0.0 - 0.7 K/uL   Basophils Relative 1 %   Basophils Absolute 0.1 0.0 - 0.1 K/uL   Immature Granulocytes 1 %   Abs Immature Granulocytes 0.2 (H) 0.0 - 0.1 K/uL    Comment: Performed at [facility], [address] [place], [place] [address]  Comprehensive metabolic panel     Status: Abnormal   Collection Time: 04/04/18  6:42 AM  Result Value Ref Range   Sodium 137 135 - 145 mmol/L   Potassium 3.6 3.5 - 5.1 mmol/L   Chloride 99 98 - 111 mmol/L   CO2 28 22 - 32 mmol/L   Glucose, Bld 131 (H) 70 - 99 mg/dL   BUN 20 6 - 20 mg/dL   Creatinine, Ser 0.92 0.44 - 1.00 mg/dL   Calcium 9.8 8.9 - 10.3 mg/dL   Total Protein 6.8 6.5 - 8.1 g/dL   Albumin 3.5 3.5 - 5.0 g/dL   AST 17 15 - 41 U/L   ALT 22 0 - 44 U/L   Alkaline  Phosphatase 61 38 - 126 U/L   Total Bilirubin 0.8 0.3 - 1.2 mg/dL   GFR calc non Af Amer >60 >60 mL/min   GFR calc Af Amer >60 >60 mL/min    Comment: (NOTE) The eGFR has been calculated using the CKD EPI equation. This calculation has not been validated in all clinical situations. eGFR's persistently <60 mL/min signify possible Chronic Kidney Disease.    Anion gap 10 5 - 15    Comment: Performed at [facility] [address] [place], [place] [address]  Glucose, capillary     Status: Abnormal   Collection Time: 04/04/18  7:39 AM  Result Value Ref Range   Glucose-Capillary 170 (H) 70 - 99 mg/dL  Glucose, capillary     Status: Abnormal   Collection Time: 04/04/18 11:57 AM  Result Value Ref Range   Glucose-Capillary 116 (H) 70 - 99 mg/dL  Glucose, capillary     Status: Abnormal   Collection Time: 04/04/18  5:03 PM  Result Value Ref Range  Glucose-Capillary 186 (H) 70 - 99 mg/dL  Glucose, capillary     Status: Abnormal   Collection Time: 04/04/18  9:08 PM  Result Value Ref Range   Glucose-Capillary 126 (H) 70 - 99 mg/dL   Comment 1 Notify RN   Glucose, capillary     Status: Abnormal   Collection Time: 04/05/18  6:28 AM  Result Value Ref Range   Glucose-Capillary 101 (H) 70 - 99 mg/dL   Comment 1 Notify RN   Glucose, capillary     Status: Abnormal   Collection Time: 04/05/18 11:52 AM  Result Value Ref Range   Glucose-Capillary 200 (H) 70 - 99 mg/dL  Glucose, capillary     Status: Abnormal   Collection Time: 04/05/18  4:33 PM  Result Value Ref Range   Glucose-Capillary 171 (H) 70 - 99 mg/dL  Glucose, capillary     Status: Abnormal   Collection Time: 04/05/18  9:17 PM  Result Value Ref Range   Glucose-Capillary 123 (H) 70 - 99 mg/dL  Glucose, capillary     Status: Abnormal   Collection Time: 04/06/18  6:40 AM  Result Value Ref Range   Glucose-Capillary 109 (H) 70 - 99 mg/dL     Constitutional: No distress . Vital signs reviewed. HEENT: EOMI, oral membranes  moist Neck: supple Cardiovascular: RRR without murmur. No JVD    Respiratory: CTA Bilaterally without wheezes or rales. Normal effort    GI: BS +, non-tender, non-distended  Extremity:  Pulses positive and No Edema Skin:   Intact Neuro: Alert/Oriented, Normal Sensory and Normal Motor Musc/Skel:  Other Left hip pain and contracture with int/ext rotation Gen NAD   Assessment/Plan: 1. 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 Physiatrist is providing close team supervision and 24 hour management of active medical problems listed below. Physiatrist and rehab team continue to assess barriers to discharge/monitor patient progress toward functional and medical goals. FIM:             Function - Chair/bed transfer Chair/bed transfer method: Ambulatory Chair/bed transfer assist level: Supervision or verbal cues Chair/bed transfer assistive device: Armrests, Walker  Function - Locomotion: Ambulation Assistive device: Walker-rolling Max distance: 133f Assist level: Supervision or verbal cues Assist level: Supervision or verbal cues Assist level: Supervision or verbal cues Assist level: Supervision or verbal cues  Function - Comprehension Comprehension: Auditory Comprehension assist level: Follows complex conversation/direction with no assist  Function - Expression Expression: Verbal Expression assist level: Expresses complex ideas: With extra time/assistive device  Function - Social Interaction Social Interaction assist level: Interacts appropriately with others - No medications needed.  Function - Problem Solving Problem solving assist level: Solves complex problems: With extra time  Function - Memory Memory assist level: Complete Independence: No helper Patient normally able to recall (first 3 days only): Current  season, Location of own room, Staff names and faces, That he or she is in a hospital  Medical Problem List and Plan: 1. 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  -Continue therapies  2. DVT Prophylaxis/Anticoagulation: Pharmaceutical:Lovenox 3.Endstage OA left hip/Pain Management:Schedule tylenol. Will add Aspercreme/Ice prn, may worsen with prednisone taper 4. Mood:LCSW to follow for evaluation and support.  -Encouraged use of Ativan at night to help with anxiety  -continue scheduled trazodone  -Discussed ways to improve sleep hygiene  -[place] 5. Neuropsych: This patientiscapable of  making decisions on herown behalf. 6. Skin/Wound Care:Routine pressure relief measures 7. Fluids/Electrolytes/Nutrition:Monitor I's and O's.  . 8. T2DM:Monitor blood sugars AC at bedtime. Continue metformin daily with sliding scale insulin for elevated blood sugars.  -Fair to borderline control at present 9. RAJ:HHIDUPB blood pressures twice daily. Continue Avapro daily  -Controlled 9/28 10 GERD:Managed with Protonix 11. Asthma with DOE/trachel stenosis:Continue duo nebs 4 times daily.Slowsteroid taper recommended. 12.History of depression/anxiety:Continue Lexapro. Continue Ativan as needed for increased anxiety likely due to steroids. 13. Leucocytosis: Likely due to steroids--monitor for signs of infection.  LOS (Days) 3 A FACE TO FACE EVALUATION WAS PERFORMED  Meredith Staggers 04/06/2018, 8:12 AM

## 2018-04-06 NOTE — Progress Notes (Signed)
Pt has requested assistance to the bathroom. Pt wants to sit in the bathroom for a while alone. Stated she prefers the door be shut. Nurse has respected her request and is waiting in the room while bathroom door is closed and pt is alone.

## 2018-04-07 ENCOUNTER — Inpatient Hospital Stay (HOSPITAL_COMMUNITY): Payer: BLUE CROSS/BLUE SHIELD | Admitting: Physical Therapy

## 2018-04-07 ENCOUNTER — Inpatient Hospital Stay (HOSPITAL_COMMUNITY): Payer: BLUE CROSS/BLUE SHIELD | Admitting: Occupational Therapy

## 2018-04-07 LAB — GLUCOSE, CAPILLARY
GLUCOSE-CAPILLARY: 118 mg/dL — AB (ref 70–99)
GLUCOSE-CAPILLARY: 168 mg/dL — AB (ref 70–99)
GLUCOSE-CAPILLARY: 109 mg/dL — AB (ref 70–99)
Glucose-Capillary: 136 mg/dL — ABNORMAL HIGH (ref 70–99)

## 2018-04-07 NOTE — Progress Notes (Signed)
Physical Therapy Discharge Summary  Patient Details  Name: Caitlyn Anderson MRN: [id number] Date of Birth: [birth year]  Today's Date: 04/07/2018 PT Individual Time: 0902-1000 and 3664-4034 PT Individual Time Calculation (min): 58 min and 26 min   Patient has met 7 of 7 long term goals due to improved activity tolerance, improved balance, improved postural control, increased strength and ability to compensate for deficits.  Patient to discharge at an ambulatory level mod I with rollator, supervision for stair negotiation with SPC. No family/friends/caregivers present for education during pt's stay in CIR.  Reasons goals not met: n/a  Recommendation:  Patient will benefit from ongoing skilled PT services in outpatient setting to continue to advance safe functional mobility, address ongoing impairments in L hip pain management, endurance, strengthening, balance, and minimize fall risk.  Equipment: rollator, pt already has [facility]  Reasons for discharge: discharge from hospital  Patient/family agrees with progress made and goals achieved: Yes  Skilled PT Treatment: Treatment 1: Pt received in recliner & agreeable to tx, denying c/o pain at beginning of session. Educated pt on DME recommendations (rollator) and OPPT f/u. Pt denies any concerns regarding d/c home tomorrow. Pt completed all grad day activities (gait over even & uneven surface, ramp, sit<>stand transfers, car transfer, and retrieving object from floor) with mod I with use of rollator PRN. Pt negotiates 24 steps without rails and with SPC with supervision with pt self selecting step over step when ascending stairs, but compensatory pattern when descending stairs. Pt ambulates unit<>outside [facility] entrance with Mod I and negotiates uneven surface (concrete & brick) and negotiates curb with mod I and rollator with instructional cuing for rollator management over curb. Discussed floor transfer as pt reports she has done exercises on the floor in  the past with therapist educating pt to perform exercises on bed vs floor. Back on unit pt utilized nu-step on level 4 x 12 minutes with all four extremities with task focusing on endurance & strength training. Pt educated on mod I in room with rollator. At end of session pt left in recliner.  Treatment 2: Pt received in room & agreeable to tx. Pt reports "I can feel it" in regards to L hip but does not rate any pain. Therapist adjusted height of pt's personal rollator she just received. Pt ambulates around unit with rollator & mod I. Pt completes furniture transfers from low compliant couch and rocking recliner in apartment mod I. Pt performed bed mobility on mat table with mod I. Pt utilized cybex kinetron in sitting then standing with BUE support with task focusing on BLE strengthening with cuing for upright posture. At end of session pt left in room with all needs in reach.   PT Discharge Precautions/Restrictions Precautions Precautions: None Restrictions Weight Bearing Restrictions: No  Vision/Perception  Pt wears glasses at all times at baseline. No changes in baseline vision. No apparent visual deficits.    Cognition Overall Cognitive Status: Within Functional Limits for tasks assessed Arousal/Alertness: Awake/alert Orientation Level: Oriented X4 Memory: Appears intact Awareness: Appears intact Problem Solving: Appears intact Safety/Judgment: Appears intact  Sensation Sensation Light Touch: (denies numbness/tingling in BLE, reports intermittent tingling in R hand that has been occuring for years especially when she crocets) Proprioception: Appears Intact Coordination Gross Motor Movements are Fluid and Coordinated: Yes Fine Motor Movements are Fluid and Coordinated: Yes  Motor  Motor Motor - Skilled Clinical Observations: generalized deconditioning Motor - Discharge Observations: generalized deconditioning   Mobility Bed Mobility Bed Mobility: Rolling Right;Rolling  Left;Supine to Sit;Sit to Supine(on mat table) Rolling Right: Independent Rolling Left: Independent Supine to Sit: Independent Sit to Supine: Independent Transfers Transfers: Sit to Stand;Stand to Sit Sit to Stand: Independent Stand to Sit: Independent   Locomotion  Gait Ambulation: Yes Gait Assistance: Independent with assistive device Gait Distance (Feet): (>150 ft) Assistive device: (rollator) Gait Assistance Details:   Gait Gait: Yes Gait Pattern: (decreased weight shifting to L 2/2 chronic L hip OA) Stairs / Additional Locomotion Stairs: Yes Stairs Assistance: Supervision/Verbal cueing Stair Management Technique: No rails;With cane Number of Stairs: 24 Height of Stairs: (6" + 3") Ramp: Independent with assistive device(ambulatory with RW) Curb: Independent with assistive device(with rollator) Wheelchair Mobility Wheelchair Mobility: No   Trunk/Postural Assessment  Cervical Assessment Cervical Assessment: Within Functional Limits Thoracic Assessment Thoracic Assessment: Within Functional Limits Lumbar Assessment Lumbar Assessment: Within Functional Limits   Balance Berg = 43/56 on 04/04/18   Extremity Assessment  All extremities WFL, pt with hx of chronic L hip OA pain.  See Function Navigator for Current Functional Status.  Waunita Schooner 04/07/2018, 3:47 PM

## 2018-04-07 NOTE — Progress Notes (Signed)
Occupational Therapy Session Note  Patient Details  Name: PREEYA CLECKLEY MRN: [id number] Date of Birth: 1961-05-02  Today's Date: 04/07/2018 OT Individual Time: 6147-0929 OT Individual Time Calculation (min): 54 min    Short Term Goals: Week 1:  OT Short Term Goal 1 (Week 1): STG = LTGs due to ELOS  Skilled Therapeutic Interventions/Progress Updates:    Treatment session with focus on functional mobility in community setting.  Pt received upright in recliner expressing desire to go outside.  Pt ambulated outside with Rollator at Mod I level.  Engaged in discussion regarding energy conservation strategies for community setting and home making tasks.  Pt able to verbalize understanding and report strategies she has used in the past.  Reiterated recommendations made during previous therapy sessions during meal prep and laundry tasks.  Pt reports pleased with progress and "ready to go home".  Pt returned to room and left upright in recliner.    Therapy Documentation Precautions:  Precautions Precautions: None Precaution Comments: dyspneic with minimal exertion Restrictions Weight Bearing Restrictions: No General:   Vital Signs: Therapy Vitals Temp: 98.6 F (37 C) Pulse Rate: 86 Resp: 16 BP: 115/73 Patient Position (if appropriate): Lying Oxygen Therapy SpO2: 92 % O2 Device: Room Air Pain:  Pt with no c/o pain  See Function Navigator for Current Functional Status.   Therapy/Group: Individual Therapy  Simonne Come 04/07/2018, 3:52 PM

## 2018-04-07 NOTE — Progress Notes (Signed)
Occupational Therapy Session Note  Patient Details  Name: Caitlyn Anderson MRN: [id number] Date of Birth: Apr 21, 1961  Today's Date: 04/07/2018 OT Individual Time: 3754-3606 OT Individual Time Calculation (min): 59 min    Short Term Goals: Week 1:  OT Short Term Goal 1 (Week 1): STG = LTGs due to ELOS  Skilled Therapeutic Interventions/Progress Updates:    Completed ADL retraining at overall Mod I level.  Pt voicing desire to d/c home tomorrow.  Ambulated around room with Rollator at Mod I level and gathered all items prior to bathing/dressing at sit > stand level in room shower.  Pt completed bathing in standing ~75% of time, utilized shower seat for ~25% when washing lower legs and for energy conservation.  Pt expresses desire to have shower seat for home use.  Completed all grooming tasks in standing at sink.  Pt donned Lt sock with sock aid and donned shoes without assist utilizing elastic laces.  Pt ambulated to ADL apt with Rollator at Mod I level, completed toilet and tub/shower transfer with use of tub transfer bench at Mod I level.  Returned to room and left upright in recliner and nursing present to provide morning meds.    Therapy Documentation Precautions:  Precautions Precautions: Fall Precaution Comments: dyspneic with minimal exertion Restrictions Weight Bearing Restrictions: No Vital Signs: Therapy Vitals Temp: (!) 97.5 F (36.4 C) Temp Source: Oral Pulse Rate: 69 Resp: 17 BP: 106/60 Patient Position (if appropriate): Lying Oxygen Therapy SpO2: 97 % O2 Device: Room Air Pain:  Pt with no c/o pain  See Function Navigator for Current Functional Status.   Therapy/Group: Individual Therapy  Simonne Come 04/07/2018, 9:01 AM

## 2018-04-07 NOTE — Progress Notes (Signed)
Social Work Patient ID: Caitlyn Anderson, female   DOB: [birth year], 59 y.o.   MRN: [id number] Team feels and MD feels pt is ready for discharge tomorrow. Pt is ready and agreeable to equipment and OP PT follow up. Make arrangements for tomorrow.

## 2018-04-07 NOTE — Progress Notes (Signed)
Subjective/Complaints:  No issues overnite, denies wheezing + constipition, had BM with dulc supp  ROS: denies CP, SOB, N/V/D.    Objective: Vital Signs: Blood pressure 106/60, pulse 69, temperature (!) 97.5 F (36.4 C), temperature source Oral, resp. rate 17, height 5' (1.524 m), weight 86.5 kg, SpO2 97 %. No results found. Results for orders placed or performed during the hospital encounter of 04/03/18 (from the past 72 hour(s))  Glucose, capillary     Status: Abnormal   Collection Time: 04/04/18 11:57 AM  Result Value Ref Range   Glucose-Capillary 116 (H) 70 - 99 mg/dL  Glucose, capillary     Status: Abnormal   Collection Time: 04/04/18  5:03 PM  Result Value Ref Range   Glucose-Capillary 186 (H) 70 - 99 mg/dL  Glucose, capillary     Status: Abnormal   Collection Time: 04/04/18  9:08 PM  Result Value Ref Range   Glucose-Capillary 126 (H) 70 - 99 mg/dL   Comment 1 Notify RN   Glucose, capillary     Status: Abnormal   Collection Time: 04/05/18  6:28 AM  Result Value Ref Range   Glucose-Capillary 101 (H) 70 - 99 mg/dL   Comment 1 Notify RN   Glucose, capillary     Status: Abnormal   Collection Time: 04/05/18 11:52 AM  Result Value Ref Range   Glucose-Capillary 200 (H) 70 - 99 mg/dL  Glucose, capillary     Status: Abnormal   Collection Time: 04/05/18  4:33 PM  Result Value Ref Range   Glucose-Capillary 171 (H) 70 - 99 mg/dL  Glucose, capillary     Status: Abnormal   Collection Time: 04/05/18  9:17 PM  Result Value Ref Range   Glucose-Capillary 123 (H) 70 - 99 mg/dL  Glucose, capillary     Status: Abnormal   Collection Time: 04/06/18  6:40 AM  Result Value Ref Range   Glucose-Capillary 109 (H) 70 - 99 mg/dL  Glucose, capillary     Status: Abnormal   Collection Time: 04/06/18 11:39 AM  Result Value Ref Range   Glucose-Capillary 160 (H) 70 - 99 mg/dL  Glucose, capillary     Status: Abnormal   Collection Time: 04/06/18  4:26 PM  Result Value Ref Range    Glucose-Capillary 207 (H) 70 - 99 mg/dL   Comment 1 Notify RN   Glucose, capillary     Status: Abnormal   Collection Time: 04/06/18  8:59 PM  Result Value Ref Range   Glucose-Capillary 132 (H) 70 - 99 mg/dL   Comment 1 Notify RN   Glucose, capillary     Status: Abnormal   Collection Time: 04/07/18  6:29 AM  Result Value Ref Range   Glucose-Capillary 118 (H) 70 - 99 mg/dL   Comment 1 Notify RN      Constitutional: No distress . Vital signs reviewed. HEENT: EOMI, oral membranes moist Neck: supple Cardiovascular: RRR without murmur. No JVD    Respiratory: CTA Bilaterally without wheezes or rales. Normal effort    GI: BS +, non-tender, non-distended  Extremity:  Pulses positive and No Edema Skin:   Intact Neuro: Alert/Oriented, Normal Sensory and Normal Motor Musc/Skel:  Other Left hip pain and contracture with int/ext rotation Gen NAD   Assessment/Plan: 1. 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 Physiatrist is providing close team supervision and 24 hour management of active medical  problems listed below. Physiatrist and rehab team continue to assess barriers to discharge/monitor patient progress toward functional and medical goals. FIM:             Function - Chair/bed transfer Chair/bed transfer method: Ambulatory Chair/bed transfer assist level: Supervision or verbal cues Chair/bed transfer assistive device: Armrests, Walker  Function - Locomotion: Ambulation Assistive device: Walker-rolling Max distance: 126ft Assist level: Supervision or verbal cues Assist level: Supervision or verbal cues Assist level: Supervision or verbal cues Assist level: Supervision or verbal cues  Function - Comprehension Comprehension: Auditory Comprehension assist level: Follows complex conversation/direction with no  assist  Function - Expression Expression: Verbal Expression assist level: Expresses complex ideas: With extra time/assistive device  Function - Social Interaction Social Interaction assist level: Interacts appropriately with others - No medications needed.  Function - Problem Solving Problem solving assist level: Solves complex problems: With extra time  Function - Memory Memory assist level: Complete Independence: No helper Patient normally able to recall (first 3 days only): Current season, Location of own room, Staff names and faces, That he or she is in a hospital  Medical Problem List and Plan: 1. 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  -Cont Rehab PT, OT, plan D/C in am 2. DVT Prophylaxis/Anticoagulation: Pharmaceutical:Lovenox 3.Endstage OA left hip/Pain Management:Schedule tylenol. Will add Aspercreme/Ice prn,no change  with prednisone taper 4. Mood:LCSW to follow for evaluation and support.  -Encouraged use of Ativan at night to help with anxiety  -continue scheduled trazodone  -Discussed ways to improve sleep hygiene  -[place] 5. Neuropsych: This patientiscapable of making decisions on herown behalf. 6. Skin/Wound Care:Routine pressure relief measures 7. Fluids/Electrolytes/Nutrition:Monitor I's and O's.  . 8. T2DM:Monitor blood sugars AC at bedtime. Continue metformin daily with sliding scale insulin for elevated blood sugars.   CBG (last 3)  Recent Labs    04/06/18 1626 04/06/18 2059 04/07/18 0629  GLUCAP 207* 132* 118*  controlled 9/30 9. MEQ:ASTMHDQ blood pressures twice daily. Continue Avapro daily Vitals:   04/06/18 2038 04/07/18 0532  BP: 130/75 106/60  Pulse: 71 69  Resp: 17 17  Temp: 97.7 F (36.5 C) (!) 97.5 F (36.4 C)  SpO2: 98% 97%  controlled 9/30 10 GERD:Managed with Protonix 11. Asthma with DOE/trachel stenosis:Continue duo nebs 4 times daily.Slowsteroid taper recommended. Last racemic  epi was yesterday am, no stridor this am, will not need at home 12.History of depression/anxiety:Continue Lexapro. Continue Ativan as needed for increased anxiety likely due to steroids. 13. Leucocytosis: Likely due to steroids--monitor for signs of infection.  LOS (Days) 4 A FACE TO FACE EVALUATION WAS PERFORMED  Charlett Blake 04/07/2018, 8:02 AM

## 2018-04-07 NOTE — Progress Notes (Signed)
Occupational Therapy Discharge Summary  Patient Details  Name: Caitlyn Anderson MRN: [id number] Date of Birth: 05-29-1961  Patient has met 8 of 8 long term goals due to improved activity tolerance, ability to compensate for deficits and improved awareness.  Patient to discharge at overall Modified Independent level.  Patient's care partner is independent to provide the necessary intermittent assistance at discharge.    Reasons goals not met: N/A  Recommendation:  Patient will not require follow up OT at this time.  Equipment: tub transfer bench and Rollator  Reasons for discharge: treatment goals met and discharge from hospital  Patient/family agrees with progress made and goals achieved: Yes  OT Discharge Precautions/Restrictions  Precautions Precautions: None Restrictions Weight Bearing Restrictions: No General   Vital Signs Therapy Vitals Temp: 98.6 F (37 C) Pulse Rate: 86 Resp: 16 BP: 115/73 Patient Position (if appropriate): Lying Oxygen Therapy SpO2: 92 % O2 Device: Room Air Pain Pain Assessment Pain Scale: 0-10 Pain Score: 0-No pain ADL ADL Grooming: Independent Where Assessed-Grooming: Standing at sink Upper Body Bathing: Independent Where Assessed-Upper Body Bathing: Shower Lower Body Bathing: Modified independent Where Assessed-Lower Body Bathing: Shower Upper Body Dressing: Independent Where Assessed-Upper Body Dressing: Chair Lower Body Dressing: Modified independent Where Assessed-Lower Body Dressing: Chair Toileting: Modified independent Where Assessed-Toileting: Glass blower/designer: Diplomatic Services operational officer Method: Human resources officer: Modified independent Clinical cytogeneticist Method: Market researcher) Tub/Shower Equipment: Facilities manager: Modified independent Social research officer, government Method: Heritage manager: Radio broadcast assistant Vision Baseline Vision/History:  Wears glasses Wears Glasses: At all times Patient Visual Report: No change from baseline Vision Assessment?: No apparent visual deficits Cognition Overall Cognitive Status: Within Functional Limits for tasks assessed Arousal/Alertness: Awake/alert Orientation Level: Oriented X4 Memory: Appears intact Awareness: Appears intact Problem Solving: Appears intact Safety/Judgment: Appears intact Sensation Sensation Light Touch: (denies numbness/tingling in BLE, reports intermittent tingling in R hand that has been occuring for years especially when she crochets) Proprioception: Appears Intact Coordination Gross Motor Movements are Fluid and Coordinated: Yes Fine Motor Movements are Fluid and Coordinated: Yes Mobility  Bed Mobility Bed Mobility: Rolling Right;Rolling Left;Supine to Sit;Sit to Supine(on mat table) Rolling Right: Independent Rolling Left: Independent Supine to Sit: Independent Sit to Supine: Independent Transfers Sit to Stand: Independent Stand to Sit: Independent  Extremity/Trunk Assessment RUE Assessment RUE Assessment: Within Functional Limits LUE Assessment LUE Assessment: Within Functional Limits   See Function Navigator for Current Functional Status.  Simonne Come 04/07/2018, 3:58 PM

## 2018-04-08 ENCOUNTER — Ambulatory Visit: Payer: BLUE CROSS/BLUE SHIELD | Admitting: Thoracic Surgery (Cardiothoracic Vascular Surgery)

## 2018-04-08 DIAGNOSIS — E1169 Type 2 diabetes mellitus with other specified complication: Secondary | ICD-10-CM

## 2018-04-08 DIAGNOSIS — D72829 Elevated white blood cell count, unspecified: Secondary | ICD-10-CM

## 2018-04-08 DIAGNOSIS — E119 Type 2 diabetes mellitus without complications: Secondary | ICD-10-CM

## 2018-04-08 DIAGNOSIS — R7989 Other specified abnormal findings of blood chemistry: Secondary | ICD-10-CM

## 2018-04-08 LAB — BASIC METABOLIC PANEL
Anion gap: 8 (ref 5–15)
BUN: 28 mg/dL — ABNORMAL HIGH (ref 6–20)
CALCIUM: 8.8 mg/dL — AB (ref 8.9–10.3)
CO2: 25 mmol/L (ref 22–32)
CREATININE: 1.04 mg/dL — AB (ref 0.44–1.00)
Chloride: 100 mmol/L (ref 98–111)
GFR calc Af Amer: >60 mL/min (ref >60)
GFR, EST NON AFRICAN AMERICAN: 58 mL/min — AB (ref >60)
Glucose, Bld: 118 mg/dL — ABNORMAL HIGH (ref 70–99)
Potassium: 3.5 mmol/L (ref 3.5–5.1)
SODIUM: 133 mmol/L — AB (ref 135–145)

## 2018-04-08 LAB — CBC
HCT: 42.3 % (ref 36.0–46.0)
Hemoglobin: 14 g/dL (ref 12.0–15.0)
MCH: 32 pg (ref 26.0–34.0)
MCHC: 33.1 g/dL (ref 30.0–36.0)
MCV: 96.6 fL (ref 78.0–100.0)
PLATELETS: 277 10*3/uL (ref 150–400)
RBC: 4.38 MIL/uL (ref 3.87–5.11)
RDW: 12.6 % (ref 11.5–15.5)
WBC: 15.4 10*3/uL — AB (ref 4.0–10.5)

## 2018-04-08 LAB — GLUCOSE, CAPILLARY: GLUCOSE-CAPILLARY: 104 mg/dL — AB (ref 70–99)

## 2018-04-08 MED ORDER — BLOOD GLUCOSE MONITOR KIT: PACK | 0 refills | Status: DC

## 2018-04-08 MED ORDER — ATORVASTATIN CALCIUM 20 MG PO TABS: 20.0000 mg | ORAL_TABLET | Freq: Every day | ORAL | 0 refills | Status: DC

## 2018-04-08 MED ORDER — PREDNISONE 20 MG PO TABS: ORAL_TABLET | ORAL | 0 refills | Status: DC

## 2018-04-08 MED ORDER — ATORVASTATIN CALCIUM 40 MG PO TABS: 40.0000 mg | ORAL_TABLET | Freq: Every day | ORAL | 0 refills | Status: DC

## 2018-04-08 MED ORDER — TRAZODONE HCL 50 MG PO TABS: 50.0000 mg | ORAL_TABLET | Freq: Every evening | ORAL | 0 refills | Status: DC | PRN

## 2018-04-08 NOTE — Progress Notes (Signed)
Subjective/Complaints:  Discussed no driving until f/u with PCP  ROS: denies CP, SOB, N/V/D.    Objective: Vital Signs: Blood pressure 118/82, pulse 72, temperature 97.7 F (36.5 C), temperature source Oral, resp. rate 18, height 5' (1.524 m), weight 86.5 kg, SpO2 96 %. No results found. Results for orders placed or performed during the hospital encounter of 04/03/18 (from the past 72 hour(s))  Glucose, capillary     Status: Abnormal   Collection Time: 04/05/18 11:52 AM  Result Value Ref Range   Glucose-Capillary 200 (H) 70 - 99 mg/dL  Glucose, capillary     Status: Abnormal   Collection Time: 04/05/18  4:33 PM  Result Value Ref Range   Glucose-Capillary 171 (H) 70 - 99 mg/dL  Glucose, capillary     Status: Abnormal   Collection Time: 04/05/18  9:17 PM  Result Value Ref Range   Glucose-Capillary 123 (H) 70 - 99 mg/dL  Glucose, capillary     Status: Abnormal   Collection Time: 04/06/18  6:40 AM  Result Value Ref Range   Glucose-Capillary 109 (H) 70 - 99 mg/dL  Glucose, capillary     Status: Abnormal   Collection Time: 04/06/18 11:39 AM  Result Value Ref Range   Glucose-Capillary 160 (H) 70 - 99 mg/dL  Glucose, capillary     Status: Abnormal   Collection Time: 04/06/18  4:26 PM  Result Value Ref Range   Glucose-Capillary 207 (H) 70 - 99 mg/dL   Comment 1 Notify RN   Glucose, capillary     Status: Abnormal   Collection Time: 04/06/18  8:59 PM  Result Value Ref Range   Glucose-Capillary 132 (H) 70 - 99 mg/dL   Comment 1 Notify RN   Glucose, capillary     Status: Abnormal   Collection Time: 04/07/18  6:29 AM  Result Value Ref Range   Glucose-Capillary 118 (H) 70 - 99 mg/dL   Comment 1 Notify RN   Glucose, capillary     Status: Abnormal   Collection Time: 04/07/18 11:39 AM  Result Value Ref Range   Glucose-Capillary 136 (H) 70 - 99 mg/dL  Glucose, capillary     Status: Abnormal   Collection Time: 04/07/18  5:25 PM  Result Value Ref Range   Glucose-Capillary 168 (H) 70  - 99 mg/dL  Glucose, capillary     Status: Abnormal   Collection Time: 04/07/18  9:46 PM  Result Value Ref Range   Glucose-Capillary 109 (H) 70 - 99 mg/dL  Basic metabolic panel     Status: Abnormal   Collection Time: 04/08/18  6:09 AM  Result Value Ref Range   Sodium 133 (L) 135 - 145 mmol/L   Potassium 3.5 3.5 - 5.1 mmol/L   Chloride 100 98 - 111 mmol/L   CO2 25 22 - 32 mmol/L   Glucose, Bld 118 (H) 70 - 99 mg/dL   BUN 28 (H) 6 - 20 mg/dL   Creatinine, Ser 1.04 (H) 0.44 - 1.00 mg/dL   Calcium 8.8 (L) 8.9 - 10.3 mg/dL   GFR calc non Af Amer 58 (L) >60 mL/min   GFR calc Af Amer >60 >60 mL/min    Comment: (NOTE) The eGFR has been calculated using the CKD EPI equation. This calculation has not been validated in all clinical situations. eGFR's persistently <60 mL/min signify possible Chronic Kidney Disease.    Anion gap 8 5 - 15    Comment: Performed at [facility] [address], [place], [place]  [address]  CBC     Status: Abnormal   Collection Time: 04/08/18  6:09 AM  Result Value Ref Range   WBC 15.4 (H) 4.0 - 10.5 K/uL   RBC 4.38 3.87 - 5.11 MIL/uL   Hemoglobin 14.0 12.0 - 15.0 g/dL   HCT 42.3 36.0 - 46.0 %   MCV 96.6 78.0 - 100.0 fL   MCH 32.0 26.0 - 34.0 pg   MCHC 33.1 30.0 - 36.0 g/dL   RDW 12.6 11.5 - 15.5 %   Platelets 277 150 - 400 K/uL    Comment: Performed at [facility], [place] [address] [place], [place] [address]  Glucose, capillary     Status: Abnormal   Collection Time: 04/08/18  6:40 AM  Result Value Ref Range   Glucose-Capillary 104 (H) 70 - 99 mg/dL     Constitutional: No distress . Vital signs reviewed. HEENT: EOMI, oral membranes moist Neck: supple Cardiovascular: RRR without murmur. No JVD    Respiratory: CTA Bilaterally without wheezes or rales. Normal effort    GI: BS +, non-tender, non-distended  Extremity:  Pulses positive and No Edema Skin:   Intact Neuro: Alert/Oriented, Normal Sensory and Normal Motor Musc/Skel:  Other Left  hip pain and contracture with int/ext rotation Gen NAD   Assessment/Plan: 1. 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 Stable for D/C today F/u PCP in 3-4 weeks F/u CVTS, [facility]  2 weeks No PMR f/u needed See D/C summary See D/C instructions FIM:             Function - Chair/bed transfer Chair/bed transfer method: Ambulatory Chair/bed transfer assist level: Supervision or verbal cues Chair/bed transfer assistive device: Armrests, Walker  Function - Locomotion: Ambulation Assistive device: Walker-rolling Max distance: 14f Assist level: Supervision or verbal cues Assist level: Supervision or verbal cues Assist level: Supervision or verbal cues Assist level: Supervision or verbal cues  Function - Comprehension Comprehension: Auditory Comprehension assist level: Follows complex conversation/direction with no assist  Function - Expression Expression: Verbal Expression assist level: Expresses complex ideas: With extra time/assistive device  Function - Social Interaction Social Interaction assist level: Interacts appropriately with others - No medications needed.  Function - Problem Solving Problem solving assist level: Solves complex problems: With extra time  Function - Memory Memory assist level: Complete Independence: No helper Patient normally able to recall (first 3 days only): Current season, Location of own room, Staff names and faces, That he or she is in a hospital  Medical Problem List and Plan: 1. 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  -Cont Rehab PT, OT, plan D/C today using RW for now 2. DVT Prophylaxis/Anticoagulation: Pharmaceutical:Lovenox 3.Endstage OA left hip/Pain Management:Schedule tylenol. Will add Aspercreme/Ice prn,no change  with prednisone taper 4. Mood:LCSW to follow for evaluation and support.  -Encouraged use of Ativan at night to help  with anxiety  -continue scheduled trazodone  -Discussed ways to improve sleep hygiene  -[place] 5. Neuropsych: This patientiscapable of making decisions on herown behalf. 6. Skin/Wound Care:Routine pressure relief measures 7. Fluids/Electrolytes/Nutrition:Monitor I's and O's.  . 8. T2DM:Monitor blood sugars AC at bedtime. Continue metformin daily with sliding scale insulin for elevated blood sugars.   CBG (last 3)  Recent Labs    04/07/18 1725 04/07/18 2146 04/08/18 0640  GLUCAP 168* 109* 104*  controlled 10/1 9. HYOK:HTXHFSFblood pressures twice daily. Continue Avapro daily Vitals:   04/07/18 1947 04/08/18 0535  BP: 110/73 118/82  Pulse: 86 72  Resp: 18 18  Temp: 98.2 F (36.8 C) 97.7 F (36.5 C)  SpO2: 95% 96%  controlled 10/1 10 GERD:Managed with Protonix 11. Asthma with DOE/trachel stenosis:Continue duo nebs 4 times daily.Slowsteroid taper recommended. Last racemic epi was yesterday am, no stridor this am, will not need at home 12.History of depression/anxiety:Continue Lexapro. Continue Ativan as needed for increased anxiety likely due to steroids. 13. Leucocytosis: Likely due to steroids--monitor for signs of infection.  LOS (Days) 5 A FACE TO FACE EVALUATION WAS PERFORMED  Charlett Blake 04/08/2018, 7:41 AM

## 2018-04-08 NOTE — Progress Notes (Signed)
Social Work Patient ID: Caitlyn Anderson, female   DOB: [birth year], 57 y.o.   MRN: [id number]   Pt agrees with team that she is ready for d/c and is pleased she has made progress.  CSW, Ovidio Kin, has already ordered DME and made referral for Outpt PT.  CSW is available should needs arise, although she is packed up and waiting for her husband to come to get her.

## 2018-04-08 NOTE — Patient Care Conference (Signed)
Inpatient RehabilitationTeam Conference and Plan of Care Update Date: 04/08/2018   Time: 12:30 PM    Patient Name: Caitlyn Anderson      Medical Record Number: [id number]  Date of Birth: 1960/11/11 Sex: Female         Room/Bed: 4W16C/4W16C-01 Payor Info: Payor: [place] / Plan: [employer]BS OTHER / Product Type: *No Product type* /    Admitting Diagnosis: Stenosis with resp failure  Admit Date/Time:  04/03/2018  6:05 PM Admission Comments: No comment available   Primary Diagnosis:  Debility Principal Problem: Debility  Patient Active Problem List   Diagnosis Date Noted  . Diabetes ([place]) 04/08/2018  . Leucocytosis 04/08/2018  . Prerenal azotemia 04/08/2018  . Debility   . Primary osteoarthritis of left hip   . Stridor   . 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 Tracheal stricture   . Dyspnea 01/28/2018  . Wheezing 12/16/2017    Expected Discharge Date: Expected Discharge Date: 04/08/18  Team Members Present: Physician leading conference: Dr. Alysia Penna Social Worker Present: Alfonse Alpers, LCSW Nurse Present: Rayetta Humphrey, RN PT Present: Lavone Nian, PT OT Present: Simonne Come, OT     Current Status/Progress Goal Weekly Team Focus  Medical   tracheal stenosis on prednisone taper , no longer requiring racemic epi nebs  maintain med stability, taper prednisone  D/C planning   Bowel/Bladder   Continent to bowel and bladder.LBM 9/30  To continue continent to B&B with min. assisst.  To monitor I&O Q. shift.   Swallow/Nutrition/ Hydration             ADL's   Mod I bathing and dressing, Mod I IADLs  Mod I  d/c planning   Mobility   mod I with rollator except supervision for stairs with only SPC  mod I overall, except supervision for stair negotiation without rails  d/c planning, pt education, endurance, transfers, stair negotiation, gait   Communication             Safety/Cognition/ Behavioral Observations            Pain   No complain of pain.  To keep pain  level less than 3.  To assess pain levels Q shift.   Skin   Skin dry and intact.  To keep skin free of pressure sores.  To monitor skin Q shift.    Rehab Goals Patient on target to meet rehab goals: Yes Rehab Goals Revised: none *See Care Plan and progress notes for long and short-term goals.     Barriers to Discharge  Current Status/Progress Possible Resolutions Date Resolved   Physician    Medical stability     good progress  D/C home at Mod I level      Nursing                  PT  Decreased caregiver support;Home environment access/layout;Lack of/limited family support  husband works 2 days on, 2 days off, pt has 3 steps without rails to enter home              OT                  SLP                SW                Discharge Planning/Teaching Needs:  Pt plans to return to her home where she will be mod I but will have  assistance from her son and husband, as needed, especially with driving.  none - Pt is mod I and is able to direct any care she may need.   Team Discussion:  Medical and rehab teams feel pt is ready for d/c and has met mod I goals.  CSW has arranged outpt PT and ordered a rollator and tub bench for pt.  Pt also reports feeling ready to return home and is encouraged by her progress.  Revisions to Treatment Plan:  none    Continued Need for Acute Rehabilitation Level of Care: The patient requires daily medical management by a physician with specialized training in physical medicine and rehabilitation for the following conditions: Daily direction of a multidisciplinary physical rehabilitation program to ensure safe treatment while eliciting the highest outcome that is of practical value to the patient.: Yes Daily medical management of patient stability for increased activity during participation in an intensive rehabilitation regime.: Yes Daily analysis of laboratory values and/or radiology reports with any subsequent need for medication adjustment of medical  intervention for : Neurological problems;Diabetes problems;Pulmonary problems   I attest that I was present, lead the team conference, and concur with the assessment and plan of the team.   Inri Sobieski, Silvestre Mesi 04/08/2018, 12:30 PM

## 2018-04-08 NOTE — Progress Notes (Signed)
Pt. Got d/c instructions and equipment,pt. Is ready to go home with her husband. 

## 2018-04-08 NOTE — Progress Notes (Signed)
Social Work Discharge Note  The overall goal for the admission was met for:   Discharge location: Yes - home  Length of Stay: Yes - 5 days  Discharge activity level: Yes - modified independent  Home/community participation: Yes  Services provided included: MD, RD, PT, OT, RN, Pharmacy and SW  Financial Services: Private Insurance: [employer]  Follow-up services arranged: Outpatient: PT, DME: rollator; tub transfer bench and Patient/Family request agency HH: [facility], DME: [facility]  Comments (or additional information): Pt has met goals and is mod I in her room on day of d/c.  She has been to [facility] before for PT and wants to return.  They will call her with appts.  Pt feels ready to go home and is pleased with her progress.  Patient/Family verbalized understanding of follow-up arrangements: Yes  Individual responsible for coordination of the follow-up plan: pt and her husband, as needed  Confirmed correct DME delivered: Trey Sailors 04/08/2018    Harlynn Kimbell, Silvestre Mesi

## 2018-04-08 NOTE — Discharge Instructions (Signed)
Inpatient Rehab Discharge Instructions  Caitlyn Anderson Discharge date and time: 04/08/18   Activities/Precautions/ Functional Status: Activity: no lifting, driving, or strenuous exercise for till cleared by MD.  Diet: diabetic diet Wound Care: none needed   Functional status:  ___ No restrictions     ___ Walk up steps independently ___ 24/7 supervision/assistance   ___ Walk up steps with assistance _X__ Intermittent supervision/assistance  _X__ Bathe/dress independently ___ Walk with walker     ___ Bathe/dress with assistance ___ Walk Independently    ___ Shower independently ___ Walk with assistance    ___ Shower with assistance _X__ No alcohol     ___ Return to work/school ________   Special Instructions: 1. Monitor blood sugars before meals and at bedtime. Monitor diet closely for tighter control.  2. Drink plenty of fluids.   COMMUNITY REFERRALS UPON DISCHARGE:    Outpatient: PT  Agency:[facility] [place] Phone: [phone]      Date of Last Service:04/08/2018  Appointment Date/Time:WILL CONTACT PATIENT TO SET UP APPOINTMENT  Medical Equipment/Items Ordered:ROLLATOR ROLLING [place]  [phone]   My questions have been answered and I understand these instructions. I will adhere to these goals and the provided educational materials after my discharge from the hospital.  Patient/Caregiver Signature _______________________________ Date __________  Clinician Signature _______________________________________ Date __________  Please bring this form and your medication list with you to all your follow-up doctor's appointments.

## 2018-04-08 NOTE — Discharge Summary (Signed)
Physician Discharge Summary  Patient ID: Caitlyn Anderson MRN: [id number] DOB/AGE: January 19, 1961 57 y.o.  Admit date: 04/03/2018 Discharge date: 04/08/2018  Discharge Diagnoses:  Principal Problem:   Debility Active Problems:   Primary osteoarthritis of left hip   Diabetes ([place])   Leucocytosis   Prerenal azotemia   Discharged Condition: stable   Significant Diagnostic Studies: Dg Chest 2 View  Result Date: 03/29/2018 CLINICAL DATA:  Cough and shortness of breath. Fever. EXAM: CHEST - 2 VIEW COMPARISON:  03/24/2018. FINDINGS: Normal sized heart. Clear lungs. Thoracic spine degenerative changes. IMPRESSION: No acute abnormality. Electronically Signed   By: Claudie Revering M.D.   On: 03/29/2018 18:27   Dg Chest 2 View  Result Date: 03/24/2018 CLINICAL DATA:  Preoperative bronchoscopy EXAM: CHEST - 2 VIEW COMPARISON:  Chest CT February 19, 2018 FINDINGS: Lungs are clear. Heart size and pulmonary vascularity are normal. No adenopathy. No tracheal lesions are appreciable by radiography. There is degenerative change in the thoracic spine. There is mild midthoracic levoscoliosis. IMPRESSION: No edema or consolidation. Electronically Signed   By: Lowella Grip III M.D.   On: 03/24/2018 10:22   Ct Soft Tissue Neck W Contrast  Result Date: 03/29/2018 CLINICAL DATA:  Shortness of breath, productive cough, fever, sore throat, and stridor for 5 days, recent surgery for tracheal blockage. EXAM: CT NECK WITH CONTRAST TECHNIQUE: Multidetector CT imaging of the neck was performed using the standard protocol following the bolus administration of intravenous contrast. CONTRAST:  164m ISOVUE-300 IOPAMIDOL (ISOVUE-300) INJECTION 61% COMPARISON:  CT neck 02/19/2018. FINDINGS: Pharynx and larynx: Since the previous study, the patient has undergone laser resection of a tracheal web. 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 This is likely due to postoperative edema, as there is no focal hematoma. Using  electronic measurements, the region of maximal narrowing corresponds to an area of 0.2 cm squared which appears significantly worse than the preoperative glottic cross-section, and is also significantly more narrowed than the glottic area of 1.5 cm squared more inferiorly. Salivary glands: No inflammation, mass, or stone. Thyroid: Normal. Lymph nodes: None enlarged or abnormal density. Vascular: Atheromatous change RIGHT carotid bifurcation Limited intracranial: Negative Visualized orbits: Negative Mastoids and visualized paranasal sinuses: No significant fluid accumulation Skeleton: Spondylosis, described previously. 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 This correlates with the clinical findings of severe stridor. These results were called by telephone at the time of interpretation on 03/29/2018 at 7:57 pm to Dr. [facility] Hixsonwho verbally acknowledged these results. Electronically Signed   By: JStaci RighterM.D.   On: 03/29/2018 19:58   Ct Chest W Contrast  Result Date: 03/29/2018 CLINICAL DATA:  Productive cough and fever.  Shortness of breath. EXAM: CT CHEST WITH CONTRAST TECHNIQUE: Multidetector CT imaging of the chest was performed during intravenous contrast administration. CONTRAST:  1018mISOVUE-300 IOPAMIDOL (ISOVUE-300) INJECTION 61% COMPARISON:  No comparison studies available. FINDINGS: Cardiovascular: The heart size is normal. No substantial pericardial effusion. Mediastinum/Nodes: No mediastinal lymphadenopathy. There is no hilar lymphadenopathy. The esophagus has normal imaging features. There is no axillary lymphadenopathy. Lungs/Pleura: The central tracheobronchial airways are patent. Subglottic trachea appears narrowed at 4 mm (image 14/series 4). This is progressive comparing to the study from about  a month ago. 4 mm right middle lobe pulmonary nodule is unchanged. Lungs otherwise normal. Upper Abdomen: The liver shows diffusely decreased  attenuation suggesting steatosis. Musculoskeletal: No worrisome lytic or sclerotic osseous abnormality. IMPRESSION: 1. Subglottic tracheal stenosis appears more prominent today than on prior study. Patient underwent laser resection of tracheal web in this region on 03/24/2018. 2. 4 mm right middle lobe pulmonary nodule. No follow-up needed if patient is low-risk. Non-contrast chest CT can be considered in 12 months if patient is high-risk. This recommendation follows the consensus statement: Guidelines for Management of Incidental Pulmonary Nodules Detected on CT Images: From the Fleischner Society 2017; Radiology 2017; 284:228-243. Electronically Signed   By: Misty Stanley M.D.   On: 03/29/2018 19:42    Labs:  Basic Metabolic Panel: Recent Labs  Lab 04/02/18 0418 04/03/18 0313 04/04/18 0642 04/08/18 0609  NA 136 135 137 133*  K 4.2 4.2 3.6 3.5  CL 100 100 99 100  CO2 _0 GLUCOSE 156* 176* 131* 118*  BUN _1 28*  CREATININE 0.85 0.86 0.92 1.04*  CALCIUM 8.9 9.3 9.8 8.8*  MG 2.1 2.0  --   --     CBC: Recent Labs  Lab 04/03/18 0313 04/04/18 0642 04/08/18 0609  WBC 15.4* 17.4* 15.4*  NEUTROABS  --  11.5*  --   HGB 13.1 14.3 14.0  HCT 39.9 42.6 42.3  MCV 96.8 95.5 96.6  PLT 302 306 277    CBG: Recent Labs  Lab 04/07/18 0629 04/07/18 1139 04/07/18 1725 04/07/18 2146 04/08/18 0640  GLUCAP 118* 136* 168* 109* 104*    Brief HPI:   Caitlyn Anderson is a 57 year old female with history of T2DM, GERD, asthma with history of DOE and recent diagnosis of tracheal stenosis/web who underwent ablation on 96/16/2019.  She was readmitted on 03/29/2018 with productive cough, shortness of breath and fevers.  She was noted to have expiratory stridor and CT chest showed subglottic stenosis to 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  She was treated with  Solu-Medrol and nebs with improvement in respiratory status.  Recommendations of a slow steroid taper past discharge.  Therapy evaluations revealed patient with unsteady gait as well as shortness of breath affecting functional status.  CIR was recommended for follow-up therapy   Hospital Course: Caitlyn Anderson was admitted to rehab 04/03/2018 for inpatient therapies to consist of PT and OT at least three hours five days a week. Past admission physiatrist, therapy team and rehab RN have worked together to provide customized collaborative inpatient rehab.  Respiratory status has greatly improved and no evidence of stridor noted at rest or with activity.   Insomnia has been managed with use of low-dose trazodone.  She continues on slow prednisone taper.  Diabetes has been monitored with ac/hs  cbg checks and some elevation in blood sugars noted.  Patient was advised to closely monitor blood sugars past discharge as well as strict adherence to low-carb diet.  Repeat CBC shows reactive leukocytosis is resolving and she has been afebrile during her stay.  Will follow follow-up BMP shows evidence of dehydration and patient encouraged to increase fluid intake after discharge.  Her hip pain has greatly improved on steroids.  She has made great great progress during her stay and is currently modified independent.  She will continue to receive further follow-up outpatient PT after discharge.   Rehab course: During patient's stay in rehab brief team conferences was held to discuss patient's progress, set goals and discuss barriers to discharge. At admission, patient required min assist with mobility and basic self-care task. She  has had improvement in activity tolerance,  balance, postural control as well as ability to compensate for deficits.  She is able to complete ADL tasks at modified independent level.  She is modified independent for transfers and is able to ambulate greater than 150 feet with Rollator.  She is able  to navigate 12 stairs with cane.  Husband is able to provide intermittent assistance as needed after discharge.   Disposition: Home  Diet: Carb modified medium.   Special Instructions: 1. Increase fluid intake. Limit intake of carbs/sweets while on steroids.  2. Will need CT chest repeat yearly for follow up on right lung lesion. 3.  Recommend repeat CBC/BMET for follow up leucocytosis and renal status.      Allergies as of 04/08/2018      Reactions   Penicillins Hives, Rash   Has patient had a PCN reaction causing immediate rash, facial/tongue/throat swelling, SOB or lightheadedness with hypotension: No Has patient had a PCN reaction causing severe rash involving mucus membranes or skin necrosis: No Has patient had a PCN reaction that required hospitalization: No Has patient had a PCN reaction occurring within the last 10 years: No If all of the above answers are "NO", then may proceed with Cephalosporin use.      Medication List    TAKE these medications   acetaminophen 500 MG tablet Commonly known as:  TYLENOL Take 2 tablets (1,000 mg total) by mouth every 8 (eight) hours as needed for mild pain.   ALPRAZolam 0.25 MG tablet Commonly known as:  XANAX Take 0.25 mg by mouth daily as needed for anxiety.   aspirin 81 MG tablet Take 81 mg by mouth daily.   atorvastatin 20 MG tablet Commonly known as:  LIPITOR Take 1 tablet (20 mg total) by mouth daily.   blood glucose meter kit and supplies Kit Dispense based on patient and insurance preference. Use up to four times daily as directed. Dx: E11.9   cetirizine 10 MG tablet Commonly known as:  ZYRTEC Take 10 mg by mouth at bedtime.   Cholecalciferol 2000 units Tabs Take 2,000 Units by mouth daily.   escitalopram 10 MG tablet Commonly known as:  LEXAPRO Take 10 mg by mouth daily.   esomeprazole 20 MG capsule Commonly known as:  NEXIUM Take 40 mg by mouth daily at 12 noon.   fluticasone 50 MCG/ACT nasal  spray Commonly known as:  FLONASE Inhale 1 spray into the lungs daily.   Hypromellose 0.3 % Soln Place 1 application into both eyes at bedtime.   levothyroxine 75 MCG tablet Commonly known as:  SYNTHROID, LEVOTHROID Take 75 mcg by mouth daily before breakfast.   metFORMIN 1000 MG tablet Commonly known as:  GLUCOPHAGE Take 1,000 mg by mouth daily.   predniSONE 20 MG tablet Commonly known as:  DELTASONE Use 1.5 pills 10/2-10/57fr two days. Then decrease to one pill daily for 5 days -10/4-10/8. Then decrease to 1/2 pill 10/9-10/13 What changed:  See the new instructions.   PROAIR HFA 108 (90 Base) MCG/ACT inhaler Generic drug:  albuterol Inhale 2 puffs into the lungs 4 (four) times daily as needed for wheezing or shortness of breath.   traZODone 50 MG tablet Commonly known as:  DESYREL Take 1 tablet (50 mg total) by mouth at bedtime as needed for sleep.   valsartan 160 MG tablet Commonly known as:  DIOVAN Take 1 tablet (160 mg total) by mouth daily.      Follow-up Information    VDairl Ponder MD Follow up on 04/15/2018.  Specialty:  Internal Medicine Why:  Appointment @ 3:00 PM Contact information: [address]        Melrose Nakayama, MD. Call.   Specialty:  Cardiothoracic Surgery Why:  for follow up appointment Contact information: [address] [place] [address] [phone]        Charlett Blake, MD. Call.   Specialty:  Physical Medicine and Rehabilitation Why:  as needed Contact information: [place] [address] [phone]           Signed: Bary Leriche 04/09/2018, 4:29 PM

## 2018-04-23 ENCOUNTER — Ambulatory Visit: Payer: BLUE CROSS/BLUE SHIELD | Admitting: Thoracic Surgery (Cardiothoracic Vascular Surgery)

## 2018-04-30 ENCOUNTER — Encounter: Payer: Self-pay | Admitting: Thoracic Surgery (Cardiothoracic Vascular Surgery)

## 2018-04-30 ENCOUNTER — Other Ambulatory Visit: Payer: Self-pay

## 2018-04-30 ENCOUNTER — Ambulatory Visit: Payer: BLUE CROSS/BLUE SHIELD | Admitting: Thoracic Surgery (Cardiothoracic Vascular Surgery)

## 2018-04-30 VITALS — BP 110/80 | HR 90 | Resp 16 | Ht 60.0 in | Wt 194.0 lb

## 2018-04-30 DIAGNOSIS — Z9889 Other specified postprocedural states: Secondary | ICD-10-CM | POA: Diagnosis not present

## 2018-04-30 DIAGNOSIS — J398 Other specified diseases of upper respiratory tract: Secondary | ICD-10-CM

## 2018-04-30 NOTE — Progress Notes (Signed)
    [address]       [place] [address]             [phone]    HPI: Caitlyn Anderson returns for a scheduled follow-up visit  Alexei Gorden is a 57-year-old woman with a past history of hypertension, hyperlipidemia, type 2 diabetes, left bundle branch block, allergies, arthritis, obesity, hypothyroidism, and "asthma."  She has been having significant issues with asthma and wheezing for many years.  She recently saw Dr. Byrum.  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    I did a bronchoscopic laser resection of the web on 03/24/2018.  She did well initially but then was admitted with stridor 5 days postop.  She was treated with steroids and her symptoms improved.  She did have a short stent in rehab before going home on 04/08/2018.  Since discharge she is been feeling well.  She does still have some allergy issues.  She and her husband both say the procedure has changed her life and she is no longer stridorous or wheezing in bed at night.  Past Medical History:  Diagnosis Date  . Allergic rhinitis   . Asthma   . 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 Dyspnea   . Family history of adverse reaction to anesthesia   . GERD (gastroesophageal reflux disease)   . Hyperlipidemia   . Hypertension   . Hypothyroidism   . Left bundle branch block     Current Outpatient Medications  Medication Sig Dispense Refill  . acetaminophen (TYLENOL) 500 MG tablet Take 2 tablets (1,000 mg total) by mouth every 8 (eight) hours as needed for mild pain. 30 tablet 0  . albuterol (PROAIR HFA) 108 (90 Base) MCG/ACT inhaler Inhale 2 puffs into the lungs 4 (four) times daily as needed for wheezing or shortness of breath.     . aspirin 81 MG tablet Take 81 mg by mouth daily.     . atorvastatin (LIPITOR) 20 MG tablet Take 1 tablet (20 mg total) by mouth daily. 30 tablet 0  .  blood glucose meter kit and supplies KIT Dispense based on patient and insurance preference. Use up to four times daily as directed. Dx: E11.9 1 each 0  . cetirizine (ZYRTEC) 10 MG tablet Take 10 mg by mouth at bedtime.     . Cholecalciferol 2000 units TABS Take 2,000 Units by mouth daily.     . escitalopram (LEXAPRO) 10 MG tablet Take 10 mg by mouth daily.     . esomeprazole (NEXIUM) 20 MG capsule Take 40 mg by mouth daily at 12 noon.    . fluticasone (FLONASE) 50 MCG/ACT nasal spray Inhale 1 spray into the lungs daily.    . Hypromellose 0.3 % SOLN Place 1 application into both eyes at bedtime.    . levothyroxine (SYNTHROID, LEVOTHROID) 75 MCG tablet Take 75 mcg by mouth daily before breakfast.    . metFORMIN (GLUCOPHAGE) 1000 MG tablet Take 1,000 mg by mouth daily.     . valsartan (DIOVAN) 160 MG tablet Take 1 tablet (160 mg total) by mouth daily. 31 tablet 4   No current facility-administered medications for this visit.     Physical Exam BP 110/80 (BP Location: Right Arm, Patient Position: Sitting, Cuff Size: Large) Comment (Cuff Size): manually  Pulse 90   Resp 16     Ht 5' (1.524 m)   Wt 194 lb (88 kg)   SpO2 97% Comment: RA  BMI 37.89 kg/m  57-year-old woman in no acute distress No alert and oriented x3 with no focal deficits Clear breath sounds bilaterally, no stridor or wheezing  Impression: Mrs. Force is a 57-year-old woman who is now about a month out from a laser resection of a tracheal web.  She was readmitted about 5 days postoperatively with stridor due to swelling.  She required treatment with steroids.  She ultimately went to rehab and then did very well there.  She currently is doing extremely well.  She is not having any stridor or wheezing.  Her husband has noticed a dramatic change.  I recommended that we do a surveillance bronchoscopy at 8 weeks post procedure. That would be mid November.  We will plan to do that just with sedation.  I does want to get a look at it  after that time for any perioperative swelling to resolve.  Plan: Bronchoscopy on 05/19/2018  Liani Caris C Broughton Eppinger, MD [facility] [phone]    

## 2018-04-30 NOTE — H&P (View-Only) (Signed)
[address]       [place] [address]             [phone]    HPI: Mrs. Caitlyn Anderson returns for a scheduled follow-up visit  Caitlyn Anderson is a 57 year old woman with a past history of hypertension, hyperlipidemia, type 2 diabetes, left bundle branch block, allergies, arthritis, obesity, hypothyroidism, and "asthma."  She has been having significant issues with asthma and wheezing for many years.  She recently saw Dr. Lamonte Anderson.  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    I did a bronchoscopic laser resection of the web on 03/24/2018.  She did well initially but then was admitted with stridor 5 days postop.  She was treated with steroids and her symptoms improved.  She did have a short stent in rehab before going home on 04/08/2018.  Since discharge she is been feeling well.  She does still have some allergy issues.  She and her husband both say the procedure has changed her life and she is no longer stridorous or wheezing in bed at night.  Past Medical History:  Diagnosis Date  . Allergic rhinitis   . Asthma   . 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 Dyspnea   . Family history of adverse reaction to anesthesia   . GERD (gastroesophageal reflux disease)   . Hyperlipidemia   . Hypertension   . Hypothyroidism   . Left bundle branch block     Current Outpatient Medications  Medication Sig Dispense Refill  . acetaminophen (TYLENOL) 500 MG tablet Take 2 tablets (1,000 mg total) by mouth every 8 (eight) hours as needed for mild pain. 30 tablet 0  . albuterol (PROAIR HFA) 108 (90 Base) MCG/ACT inhaler Inhale 2 puffs into the lungs 4 (four) times daily as needed for wheezing or shortness of breath.     Marland Kitchen aspirin 81 MG tablet Take 81 mg by mouth daily.     Marland Kitchen atorvastatin (LIPITOR) 20 MG tablet Take 1 tablet (20 mg total) by mouth daily. 30 tablet 0  .  blood glucose meter kit and supplies KIT Dispense based on patient and insurance preference. Use up to four times daily as directed. Dx: E11.9 1 each 0  . cetirizine (ZYRTEC) 10 MG tablet Take 10 mg by mouth at bedtime.     . Cholecalciferol 2000 units TABS Take 2,000 Units by mouth daily.     Marland Kitchen escitalopram (LEXAPRO) 10 MG tablet Take 10 mg by mouth daily.     Marland Kitchen esomeprazole (NEXIUM) 20 MG capsule Take 40 mg by mouth daily at 12 noon.    . fluticasone (FLONASE) 50 MCG/ACT nasal spray Inhale 1 spray into the lungs daily.    . Hypromellose 0.3 % SOLN Place 1 application into both eyes at bedtime.    Marland Kitchen levothyroxine (SYNTHROID, LEVOTHROID) 75 MCG tablet Take 75 mcg by mouth daily before breakfast.    . metFORMIN (GLUCOPHAGE) 1000 MG tablet Take 1,000 mg by mouth daily.     . valsartan (DIOVAN) 160 MG tablet Take 1 tablet (160 mg total) by mouth daily. 31 tablet 4   No current facility-administered medications for this visit.     Physical Exam BP 110/80 (BP Location: Right Arm, Patient Position: Sitting, Cuff Size: Large) Comment (Cuff Size): manually  Pulse 90   Resp 16  Ht 5' (1.524 m)   Wt 194 lb (88 kg)   SpO2 97% Comment: RA  BMI 37.38 kg/m  57 year old woman in no acute distress No alert and oriented x3 with no focal deficits Clear breath sounds bilaterally, no stridor or wheezing  Impression: Caitlyn Anderson is a 58 year old woman who is now about a month out from a laser resection of a tracheal web.  She was readmitted about 5 days postoperatively with stridor due to swelling.  She required treatment with steroids.  She ultimately went to rehab and then did very well there.  She currently is doing extremely well.  She is not having any stridor or wheezing.  Her husband has noticed a dramatic change.  I recommended that we do a surveillance bronchoscopy at 8 weeks post procedure. That would be mid November.  We will plan to do that just with sedation.  I does want to get a look at it  after that time for any perioperative swelling to resolve.  Plan: Bronchoscopy on 05/19/2018  Melrose Nakayama, MD [facility] [phone]

## 2018-05-01 ENCOUNTER — Other Ambulatory Visit: Payer: Self-pay | Admitting: *Deleted

## 2018-05-01 DIAGNOSIS — J398 Other specified diseases of upper respiratory tract: Secondary | ICD-10-CM

## 2018-05-05 ENCOUNTER — Ambulatory Visit: Payer: BLUE CROSS/BLUE SHIELD | Admitting: Emergency Medicine

## 2018-05-05 ENCOUNTER — Encounter: Payer: Self-pay | Admitting: Emergency Medicine

## 2018-05-05 DIAGNOSIS — J398 Other specified diseases of upper respiratory tract: Secondary | ICD-10-CM | POA: Diagnosis not present

## 2018-05-05 DIAGNOSIS — R062 Wheezing: Secondary | ICD-10-CM

## 2018-05-05 NOTE — Assessment & Plan Note (Signed)
Significant clinical improvement since she had her tracheal web repaired.  She did have a period postoperatively of tracheal edema and acute dyspnea, stridor that is improved.  Overall she is better than she was when I met her.  I think it would be reasonable to repeat her PFT if her dyspnea persists several months after the procedure.

## 2018-05-05 NOTE — Patient Instructions (Signed)
Follow with Dr. Roxan Hockey as planned. Continue fluticasone nasal spray, cetirizine as you are taking them for now. You can stop the Nexium. We will reassess your breathing and your cough several months from now after you have had time to fully recover from your tracheal procedure.  Depending on your symptoms we will decide whether you need to have any further testing, change in medications. Follow with Dr Lamonte Sakai in 4 months or sooner if you have any problems.

## 2018-05-05 NOTE — Assessment & Plan Note (Signed)
With some associated cough.  Again this is better.  I would like to assess several months after she is recovered from her procedure before we decide whether she needs to continue aggressive treatment for GERD.  I will continue the allergy treatment for now.

## 2018-05-05 NOTE — Progress Notes (Signed)
Subjective:    Patient ID: Caitlyn Anderson, female    DOB: 10/19/1960, 57 y.o.   MRN: [id number]  HPI  ROV 02/28/18 --Caitlyn Anderson returns today to discuss her shortness of breath stridor and cough.  She underwent bronchoscopy on 02/03/2018 after her spirometry showed an abnormal flow volume loop consistent with a suspected intrathoracic fixed obstruction.  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  The bronchoscope was able to pass with some difficulty through the remaining orifice and the distal airways appeared to be normal.  Based on this I obtained a CT scan of her neck and chest on 02/19/2018 which I have reviewed.  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 Cytology on the lesion showed atypical cells, no malignancy.  The thoracic trachea and other airways appear to be normal.  I do not see any parenchymal abnormality.  In addition to the work-up above we have continued her on Nexium for any potential contribution of GERD to her upper airway irritation, continued Zyrtec as well.  She is scheduled to see Dr. Roxan Hockey with thoracic surgery next week to discuss the options for addressing the proximal tracheal anatomical obstruction.   For some reason her insurance is rejecting the FOB as an "investigational procedure" - not covering it.   ROV 05/05/18 --Caitlyn Anderson is a 57 year old woman who follows up today for her dyspnea and stridor.  As above her bronchoscopy showed a tracheal web and she has been evaluated by Dr. Roxan Hockey with thoracic surgery.  She underwent web resection 03/24/18, was readmitted with edema, swelling and stridor. She improved with steroids. She is now improved. Her breathing is overall much better than it was when we met pre-procedure. She is set for repeat bronchoscopy 11/11 with Dr Roxan Hockey.  She is on flonase, zyrtec. Ran out of nexium.     Review of Systems  Constitutional: Negative for fever and unexpected weight change.  HENT: Positive for congestion, postnasal drip, rhinorrhea, sinus pressure and sneezing. Negative for dental problem, ear pain, nosebleeds, sore throat and trouble swallowing.   Eyes: Positive for itching. Negative for redness.  Respiratory: Positive for cough, chest tightness, shortness of breath and wheezing.   Cardiovascular: Positive for palpitations. Negative for leg swelling.  Gastrointestinal: Negative for nausea and vomiting.  Genitourinary: Negative for dysuria.  Musculoskeletal: Negative for joint swelling.  Skin: Negative for rash.  Allergic/Immunologic: Positive for environmental allergies. Negative for food allergies and immunocompromised state.  Neurological: Negative for headaches.  Hematological: Does not bruise/bleed easily.  Psychiatric/Behavioral: Negative for dysphoric mood. The patient is nervous/anxious.    Past Medical History:  Diagnosis Date  . Allergic rhinitis   . Asthma   . 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 Dyspnea   . Family history of adverse reaction to anesthesia   . GERD (gastroesophageal reflux disease)   . Hyperlipidemia   . Hypertension   . Hypothyroidism   . Left bundle branch block   . Tracheal stenosis      Family History  Problem Relation Age of Onset  . Glaucoma Father   . Macular degeneration Maternal Grandmother      Social History   Socioeconomic History  . Marital status: Married    Spouse name: Not on file  . Number of children: Not on file  . Years of education: Not on file  . Highest education level:  Not on file  Occupational History  . Not on file  Social Needs  . Financial resource strain: Not on file  . Food insecurity:    Worry: Not on file    Inability: Not on file  . Transportation needs:    Medical: Not on file    Non-medical: Not on file  Tobacco Use  . Smoking status: Never Smoker  .  Smokeless tobacco: Never Used  Substance and Sexual Activity  . Alcohol use: Not Currently  . Drug use: Not Currently  . Sexual activity: Not on file  Lifestyle  . Physical activity:    Days per week: Not on file    Minutes per session: Not on file  . Stress: Not on file  Relationships  . Social connections:    Talks on phone: Not on file    Gets together: Not on file    Attends religious service: Not on file    Active member of club or organization: Not on file    Attends meetings of clubs or organizations: Not on file    Relationship status: Not on file  . Intimate partner violence:    Fear of current or ex partner: Not on file    Emotionally abused: Not on file    Physically abused: Not on file    Forced sexual activity: Not on file  Other Topics Concern  . Not on file  Social History Narrative  . 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   Allergies  Allergen Reactions  . Penicillins Hives and Rash    Has patient had a PCN reaction causing immediate rash, facial/tongue/throat swelling, SOB or lightheadedness with hypotension: No Has patient had a PCN reaction causing severe rash involving mucus membranes or skin necrosis: No Has patient had a PCN reaction that required hospitalization: No Has patient had a PCN reaction occurring within the last 10 years: No If all of the above answers are "NO", then may proceed with Cephalosporin use.      Outpatient Medications Prior to Visit  Medication Sig Dispense Refill  . acetaminophen (TYLENOL) 500 MG tablet Take 2 tablets (1,000 mg total) by mouth every 8 (eight) hours as needed for mild pain. 30 tablet 0  . albuterol (PROAIR HFA) 108 (90 Base) MCG/ACT inhaler Inhale 2 puffs into the lungs 4 (four) times daily as needed for wheezing or shortness of breath.     Marland Kitchen aspirin 81 MG tablet Take 81 mg by mouth daily.     Marland Kitchen atorvastatin (LIPITOR) 20 MG tablet Take 1 tablet (20 mg total)  by mouth daily. 30 tablet 0  . blood glucose meter kit and supplies KIT Dispense based on patient and insurance preference. Use up to four times daily as directed. Dx: E11.9 1 each 0  . cetirizine (ZYRTEC) 10 MG tablet Take 10 mg by mouth at bedtime.     . Cholecalciferol 2000 units TABS Take 2,000 Units by mouth daily.     Marland Kitchen escitalopram (LEXAPRO) 10 MG tablet Take 10 mg by mouth daily.     Marland Kitchen esomeprazole (NEXIUM) 20 MG capsule Take 40 mg by mouth daily at 12 noon.    . fluticasone (FLONASE) 50 MCG/ACT nasal spray Inhale 1 spray into the lungs daily.    . Hypromellose 0.3 % SOLN Place 1 application into both eyes at bedtime.    Marland Kitchen levothyroxine (SYNTHROID, LEVOTHROID) 75 MCG tablet Take 75  mcg by mouth daily before breakfast.    . metFORMIN (GLUCOPHAGE) 1000 MG tablet Take 1,000 mg by mouth daily.     . valsartan (DIOVAN) 160 MG tablet Take 1 tablet (160 mg total) by mouth daily. 31 tablet 4   No facility-administered medications prior to visit.         Objective:   Physical Exam Vitals:   05/05/18 1428  BP: 124/78  Pulse: 89  SpO2: 96%  Weight: 194 lb (88 kg)  Height: 5' (1.524 m)   Gen: Pleasant, overwt woman, in no distress,  normal affect  ENT: No lesions,  mouth clear,  oropharynx clear, no postnasal drip  Neck: No JVD, clear no stridor  Lungs: No use of accessory muscles, no stridor, no referred noise  Cardiovascular: RRR, heart sounds normal, no murmur or gallops, trace peripheral edema  Musculoskeletal: No deformities, no cyanosis or clubbing  Neuro: alert, non focal  Skin: Warm, no lesions or rash      Assessment & Plan:  Tracheal stricture Significant clinical improvement since she had her tracheal web repaired.  She did have a period postoperatively of tracheal edema and acute dyspnea, stridor that is improved.  Overall she is better than she was when I met her.  I think it would be reasonable to repeat her PFT if her dyspnea persists several months after the  procedure.  Wheezing With some associated cough.  Again this is better.  I would like to assess several months after she is recovered from her procedure before we decide whether she needs to continue aggressive treatment for GERD.  I will continue the allergy treatment for now.  Baltazar Apo, MD, PhD 05/05/2018, 2:50 PM  Pulmonary and Critical Care [phone] or if no answer [phone]

## 2018-05-09 ENCOUNTER — Encounter (HOSPITAL_COMMUNITY)
Admission: RE | Admit: 2018-05-09 | Discharge: 2018-05-09 | Disposition: A | Discharge status: Home / Self Care | Payer: BLUE CROSS/BLUE SHIELD | Source: Ambulatory Visit | Attending: Thoracic Surgery (Cardiothoracic Vascular Surgery) | Admitting: Thoracic Surgery (Cardiothoracic Vascular Surgery)

## 2018-05-09 ENCOUNTER — Other Ambulatory Visit: Payer: Self-pay

## 2018-05-09 ENCOUNTER — Encounter (HOSPITAL_COMMUNITY): Payer: Self-pay

## 2018-05-09 DIAGNOSIS — Z01812 Encounter for preprocedural laboratory examination: Secondary | ICD-10-CM | POA: Diagnosis not present

## 2018-05-09 DIAGNOSIS — J398 Other specified diseases of upper respiratory tract: Secondary | ICD-10-CM

## 2018-05-09 LAB — CBC
HCT: 44.4 % (ref 36.0–46.0)
HEMOGLOBIN: 14.1 g/dL (ref 12.0–15.0)
MCH: 30.9 pg (ref 26.0–34.0)
MCHC: 31.8 g/dL (ref 30.0–36.0)
MCV: 97.2 fL (ref 80.0–100.0)
Platelets: 335 10*3/uL (ref 150–400)
RBC: 4.57 MIL/uL (ref 3.87–5.11)
RDW: 12.7 % (ref 11.5–15.5)
WBC: 8.4 10*3/uL (ref 4.0–10.5)
nRBC: 0 % (ref 0.0–0.2)

## 2018-05-09 LAB — COMPREHENSIVE METABOLIC PANEL
ALBUMIN: 3.8 g/dL (ref 3.5–5.0)
ALK PHOS: 73 U/L (ref 38–126)
ALT: 35 U/L (ref 0–44)
ANION GAP: 7 (ref 5–15)
AST: 29 U/L (ref 15–41)
BUN: 11 mg/dL (ref 6–20)
CO2: 27 mmol/L (ref 22–32)
Calcium: 9.8 mg/dL (ref 8.9–10.3)
Chloride: 105 mmol/L (ref 98–111)
Creatinine, Ser: 0.92 mg/dL (ref 0.44–1.00)
GFR calc non Af Amer: >60 mL/min (ref >60)
GLUCOSE: 125 mg/dL — AB (ref 70–99)
Potassium: 4.6 mmol/L (ref 3.5–5.1)
Sodium: 139 mmol/L (ref 135–145)
Total Bilirubin: 0.5 mg/dL (ref 0.3–1.2)
Total Protein: 6.9 g/dL (ref 6.5–8.1)

## 2018-05-09 LAB — GLUCOSE, CAPILLARY: Glucose-Capillary: 111 mg/dL — ABNORMAL HIGH (ref 70–99)

## 2018-05-09 LAB — APTT: aPTT: 33 seconds (ref 24–36)

## 2018-05-09 LAB — SURGICAL PCR SCREEN
MRSA, PCR: NEGATIVE
STAPHYLOCOCCUS AUREUS: POSITIVE — AB

## 2018-05-09 LAB — PROTIME-INR
INR: 1.01
Prothrombin Time: 13.2 seconds (ref 11.4–15.2)

## 2018-05-09 NOTE — Progress Notes (Addendum)
PCP - Dr. Audery Amel Vasireddy Cardiologist - Dr. Cleora Fleet- [place], [place]  Chest x-ray - will need DOS EKG - 03/29/18 Stress Test - patient has had in [place], unsure of date. Records requested. ECHO - patient has had in [place], unsure of date. Records requested. Cardiac Cath - denies  Sleep Study - patient states she had sleep study several years ago, possibly at hospital in [place]. CPAP - does not use CPAP. Patient states her snoring and breathing issues have stopped following her tx for tracheal stricture.  Patient does not regularly check blood sugar. Fasting CBG value unknown. Pt states may have had A1c recently with PCP. Record requested. May need A1C DOS.  Aspirin Instructions: patient instructed per Thurmond Butts with Dr. Roxan Hockey to continue aspirin until DOS.  Anesthesia review: yes, cardiac history.  Patient denies shortness of breath, fever, cough and chest pain at PAT appointment   Patient verbalized understanding of instructions that were given to them at the PAT appointment. Patient was also instructed that they will need to review over the PAT instructions again at home before surgery.

## 2018-05-09 NOTE — Pre-Procedure Instructions (Addendum)
Caitlyn Anderson  05/09/2018      [place], [place] [address] Phone: [phone] Fax: [phone]    Your procedure is scheduled on Monday November 11th.  Report to [name] Admitting at 5:30 A.M.  Call this number if you have problems the morning of surgery:  [phone]   Remember:  Do not eat or drink after midnight.      Take these medicines the morning of surgery with A SIP OF WATER  escitalopram (LEXAPRO) fluticasone (FLONASE)  acetaminophen (TYLENOL)-if needed albuterol (PROAIR HFA) -if needed. Please bring inhaler with you to the hospital   Follow your surgeon's instructions on when to stop Asprin.  If no instructions were given by your surgeon then you will need to call the office to get those instructions.    7 days prior to surgery STOP taking any Aspirin(unless otherwise instructed by your surgeon), Aleve, Naproxen, Ibuprofen, Motrin, Advil, Goody's, BC's, all herbal medications, fish oil, and all vitamins   HOW TO MANAGE YOUR DIABETES BEFORE AND AFTER SURGERY  Why is it important to control my blood sugar before and after surgery? . Improving blood sugar levels before and after surgery helps healing and can limit problems. . A way of improving blood sugar control is eating a healthy diet by: o  Eating less sugar and carbohydrates o  Increasing activity/exercise o  Talking with your doctor about reaching your blood sugar goals . High blood sugars (greater than 180 mg/dL) can raise your risk of infections and slow your recovery, so you will need to focus on controlling your diabetes during the weeks before surgery. . Make sure that the doctor who takes care of your diabetes knows about your planned surgery including the date and location.  How do I manage my blood sugar before surgery? . Check your blood sugar at least 4 times a day, starting 2 days before surgery, to make sure  that the level is not too high or low. o Check your blood sugar the morning of your surgery when you wake up and every 2 hours until you get to the Short Stay unit. . If your blood sugar is less than 70 mg/dL, you will need to treat for low blood sugar: o Do not take insulin. o Treat a low blood sugar (less than 70 mg/dL) with  cup of clear juice (cranberry or apple), 4 glucose tablets, OR glucose gel. Recheck blood sugar in 15 minutes after treatment (to make sure it is greater than 70 mg/dL). If your blood sugar is not greater than 70 mg/dL on recheck, call [phone] o  for further instructions. . Report your blood sugar to the short stay nurse when you get to Short Stay.  . If you are admitted to the hospital after surgery: o Your blood sugar will be checked by the staff and you will probably be given insulin after surgery (instead of oral diabetes medicines) to make sure you have good blood sugar levels. o The goal for blood sugar control after surgery is 80-180 mg/dL.     WHAT DO I DO ABOUT MY DIABETES MEDICATION?  Marland Kitchen Do not take oral diabetes medicines (pills): metFORMIN (GLUCOPHAGE)  the morning of surgery.        Do not wear jewelry, make-up or nail polish.  Do not wear lotions, powders, or perfumes, or deodorant.  Do not shave 48 hours prior to surgery.  Men  may shave face and neck.  Do not bring valuables to the hospital.  [facility] is not responsible for any belongings or valuables.  Contacts, eyeglasses, hearing aids,dentures or bridgework may not be worn into surgery.  Leave your suitcase in the car.  After surgery it may be brought to your room.  For patients admitted to the hospital, discharge time will be determined by your treatment team.  Patients discharged the day of surgery will not be allowed to drive home.   [employer]- Preparing For Surgery  Before surgery, you can play an important role. Because skin is not sterile, your skin needs to be as free of  germs as possible. You can reduce the number of germs on your skin by washing with CHG (chlorahexidine gluconate) Soap before surgery.  CHG is an antiseptic cleaner which kills germs and bonds with the skin to continue killing germs even after washing.    Oral Hygiene is also important to reduce your risk of infection.  Remember - BRUSH YOUR TEETH THE MORNING OF SURGERY WITH YOUR REGULAR TOOTHPASTE  Please do not use if you have an allergy to CHG or antibacterial soaps. If your skin becomes reddened/irritated stop using the CHG.  Do not shave (including legs and underarms) for at least 48 hours prior to first CHG shower. It is OK to shave your face.  Please follow these instructions carefully.   1. Shower the NIGHT BEFORE SURGERY and the MORNING OF SURGERY with CHG.   2. If you chose to wash your hair, wash your hair first as usual with your normal shampoo.  3. After you shampoo, rinse your hair and body thoroughly to remove the shampoo.  4. Use CHG as you would any other liquid soap. You can apply CHG directly to the skin and wash gently with a scrungie or a clean washcloth.   5. Apply the CHG Soap to your body ONLY FROM THE NECK DOWN.  Do not use on open wounds or open sores. Avoid contact with your eyes, ears, mouth and genitals (private parts). Wash Face and genitals (private parts)  with your normal soap.  6. Wash thoroughly, paying special attention to the area where your surgery will be performed.  7. Thoroughly rinse your body with warm water from the neck down.  8. DO NOT shower/wash with your normal soap after using and rinsing off the CHG Soap.  9. Pat yourself dry with a CLEAN TOWEL.  10. Wear CLEAN PAJAMAS to bed the night before surgery, wear comfortable clothes the morning of surgery  11. Place CLEAN SHEETS on your bed the night of your first shower and DO NOT SLEEP WITH PETS.   Day of Surgery: Shower as stated above. Do not apply any deodorants/lotions.  Please wear  clean clothes to the hospital/surgery center.   Remember to brush your teeth WITH YOUR REGULAR TOOTHPASTE.    Please read over the following fact sheets that you were given.

## 2018-05-12 NOTE — Progress Notes (Addendum)
Anesthesia Chart Review:  Case:  [id number] Date/Time:  05/19/18 0715   Procedure:  VIDEO BRONCHOSCOPY (N/A )   Anesthesia type:  Monitor Anesthesia Care   Pre-op diagnosis:  TRACHEAL STRICTURE   Location:  [facility] OR ROOM 10 / [facility]   Surgeon:  Melrose Nakayama, MD      DISCUSSION: Patient is a 57 year old female scheduled for the above procedure.    History includes never smoker, HTN, HLD, left BBB (diagnosed at least by 02/2016), DM2, dyspnea, hypothyroidism, GERD, tracheal stenosis (s/p laser bronchoscopy for resection of tracheal web 03/24/18).  - She had bronchoscopy on 02/03/18 for SOB (after spirometry showed abnormal flow volume loop consistent with a suspected intrathoracic fixed obstruction) and was noted to have abnormality in the posterior pharynx suspected to be secondary to GERD with stricture noted in the upper trachea. She was referred to CT surgery and underwent flexible fiberoptic bronchoscopy with laser resection of tracheal web 03/24/18 (pathology: inflammation, fibrinous material, no malignancy). She required re-admission 03/29/18 for stridor with tracheal edema that improved with steroids and nebulizer treatments. She has done much better with her breathing since the procedure. However, Dr. Roxan Hockey recommended surveillance bronchoscopy at 8 weeks post-op with sedation to check for resolution of perioperative edema.  She has known chronic LBBB with evaluation by cardiology earlier this year (see below). She is for CXR on the day of surgery. If no acute changes then I anticipate that she can proceed as planned.   VS: BP 131/78   Pulse 82   Temp 36.6 C   Resp 18   Ht 5' (1.524 m)   Wt 88.3 kg   SpO2 95%   BMI 38.02 kg/m    PROVIDERS: Dairl Ponder, MD is PCP Baltazar Apo, MD is pulmonologist. Last visit 05/05/18. Cleora Fleet, MD is cardiologist at [facility]. Last visit 01/08/18. He notes patient has chronic left BBB with "negative ischemia workup."  He was seeing her for preoperative evaluation for left THA (which she has not had yet until her tracheal stenosis issues resolved). She was felt stable to proceed with left THA at that time. Jerrell Belfast, MD is ENT   LABS: Labs reviewed: Acceptable for surgery. A1c 6.9 on 02/21/18 (Dr. Lunette Stands). (all labs ordered are listed, but only abnormal results are displayed)  Labs Reviewed  SURGICAL PCR SCREEN - Abnormal; Notable for the following components:      Result Value   Staphylococcus aureus POSITIVE (*)    All other components within normal limits  GLUCOSE, CAPILLARY - Abnormal; Notable for the following components:   Glucose-Capillary 111 (*)    All other components within normal limits  COMPREHENSIVE METABOLIC PANEL - Abnormal; Notable for the following components:   Glucose, Bld 125 (*)    All other components within normal limits  APTT  CBC  PROTIME-INR    IMAGES: CT chest 03/29/18 (in setting of re-admission for stridor POD#5 tracheal web resection): IMPRESSION: 1. Subglottic tracheal stenosis appears more prominent today than on prior study. Patient underwent laser resection of tracheal web in this region on 03/24/2018. 2. 4 mm right middle lobe pulmonary nodule. No follow-up needed if patient is low-risk. Non-contrast chest CT can be considered in 12 months if patient is high-risk. This recommendation follows the consensus statement: Guidelines for Management of Incidental Pulmonary Nodules Detected on CT Images: From the Fleischner Society 2017; Radiology 2017; 284:228-243.  CT soft tissue neck 03/29/18 (in setting of re-admission for stridor POD#5 tracheal  web resection): IMPRESSION: Circumferential subglottic stenosis at the site of laser resection of a tracheal web, with an approximate area of 0.2 cm squared, significantly worse than the preoperative appearance. This correlates with the clinical findings of severe stridor.   EKG: 03/29/18: SR, IVCD, consider  atypical left BBB.    CV: Echo 03/01/16 (copy from [facility]): Interpretation:  1.  LV is normal in size and systolic function.  Estimated ejection fraction of 60 to 65%. 2.  RV is normal in size and systolic function. 3.  No hemodynamically significant valvular abnormalities (trivial TR).  Stress test report not available at [facility] or at [facility], although, Dr. Milta Deiters note mentions a "Negative stress test 2017."     Past Medical History:  Diagnosis Date  . Allergic rhinitis   . Asthma   . 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 Dyspnea   . GERD (gastroesophageal reflux disease)   . Hyperlipidemia   . Hypertension   . Hypothyroidism   . Left bundle branch block   . Tracheal stenosis     Past Surgical History:  Procedure Laterality Date  . BREAST SURGERY Left    lumpectomy  . CESAREAN SECTION    . COLONOSCOPY    . DILATION AND CURETTAGE OF UTERUS    . FLEXIBLE BRONCHOSCOPY N/A 03/24/2018   Procedure: FLEXIBLE BRONCHOSCOPY;  Surgeon: Melrose Nakayama, MD;  Location: [place];  Service: Thoracic;  Laterality: N/A;  . LASER BRONCHOSCOPY N/A 03/24/2018   Procedure: LASER BRONCHOSCOPY FOR RESECTION OF TRACHEAL WEB;  Surgeon: Melrose Nakayama, MD;  Location: [facility] OR;  Service: Thoracic;  Laterality: N/A;  . TONSILLECTOMY    . VIDEO BRONCHOSCOPY Bilateral 02/03/2018   Procedure: VIDEO BRONCHOSCOPY WITHOUT FLUORO;  Surgeon: Collene Gobble, MD;  Location: [facility] ENDOSCOPY;  Service: Cardiopulmonary;  Laterality: Bilateral;    MEDICATIONS: . acetaminophen (TYLENOL) 500 MG tablet  . albuterol (PROAIR HFA) 108 (90 Base) MCG/ACT inhaler  . aspirin 81 MG tablet  . atorvastatin (LIPITOR) 20 MG tablet  . blood glucose meter kit and supplies KIT  . cetirizine (ZYRTEC) 10 MG tablet  . Cholecalciferol 2000 units TABS  . escitalopram (LEXAPRO) 10 MG tablet  . fluticasone (FLONASE) 50 MCG/ACT nasal spray  . Hypromellose 0.3 % SOLN  .  levothyroxine (SYNTHROID, LEVOTHROID) 75 MCG tablet  . metFORMIN (GLUCOPHAGE) 1000 MG tablet  . valsartan (DIOVAN) 160 MG tablet   No current facility-administered medications for this encounter.     [facility] Short Stay Center/Anesthesiology Phone [phone] 05/12/2018 3:12 PM

## 2018-05-18 NOTE — Anesthesia Preprocedure Evaluation (Addendum)
Anesthesia Evaluation  Patient identified by MRN, date of birth, ID band Patient awake    Reviewed: Allergy & Precautions, H&P , NPO status , Patient's Chart, lab work & pertinent test results  Airway Mallampati: II   Neck ROM: full    Dental  (+) Teeth Intact, Dental Advisory Given   Pulmonary shortness of breath, asthma ,    breath sounds clear to auscultation       Cardiovascular hypertension, + dysrhythmias  Rhythm:regular Rate:Normal  LBBB   Neuro/Psych    GI/Hepatic GERD  ,  Endo/Other  diabetes, Type 2Hypothyroidism obese  Renal/GU      Musculoskeletal  (+) Arthritis ,   Abdominal (+) + obese,   Peds  Hematology   Anesthesia Other Findings   Reproductive/Obstetrics                           Anesthesia Physical  Anesthesia Plan  ASA: II  Anesthesia Plan: MAC   Post-op Pain Management:    Induction: Intravenous  PONV Risk Score and Plan: 3 and Ondansetron, Dexamethasone, Midazolam and Treatment may vary due to age or medical condition  Airway Management Planned:   Additional Equipment:   Intra-op Plan:   Post-operative Plan:   Informed Consent: I have reviewed the patients History and Physical, chart, labs and discussed the procedure including the risks, benefits and alternatives for the proposed anesthesia with the patient or authorized representative who has indicated his/her understanding and acceptance.   Dental advisory given  Plan Discussed with: CRNA  Anesthesia Plan Comments:      Anesthesia Quick Evaluation

## 2018-05-19 ENCOUNTER — Encounter (HOSPITAL_COMMUNITY)
Admission: RE | Disposition: A | Discharge status: Home / Self Care | Payer: Self-pay | Source: Ambulatory Visit | Attending: Thoracic Surgery (Cardiothoracic Vascular Surgery)

## 2018-05-19 ENCOUNTER — Encounter (HOSPITAL_COMMUNITY): Payer: Self-pay | Admitting: Certified Registered Nurse Anesthetist

## 2018-05-19 ENCOUNTER — Ambulatory Visit (HOSPITAL_COMMUNITY): Payer: BLUE CROSS/BLUE SHIELD | Admitting: Certified Registered Nurse Anesthetist

## 2018-05-19 ENCOUNTER — Ambulatory Visit (HOSPITAL_COMMUNITY): Payer: BLUE CROSS/BLUE SHIELD | Admitting: Vascular Surgery

## 2018-05-19 ENCOUNTER — Ambulatory Visit (HOSPITAL_COMMUNITY): Payer: BLUE CROSS/BLUE SHIELD

## 2018-05-19 ENCOUNTER — Ambulatory Visit (HOSPITAL_COMMUNITY)
Admission: RE | Admit: 2018-05-19 | Discharge: 2018-05-19 | Disposition: A | Discharge status: Home / Self Care | Payer: BLUE CROSS/BLUE SHIELD | Source: Ambulatory Visit | Attending: Thoracic Surgery (Cardiothoracic Vascular Surgery) | Admitting: Thoracic Surgery (Cardiothoracic Vascular Surgery)

## 2018-05-19 DIAGNOSIS — K219 Gastro-esophageal reflux disease without esophagitis: Secondary | ICD-10-CM | POA: Diagnosis not present

## 2018-05-19 DIAGNOSIS — J398 Other specified diseases of upper respiratory tract: Secondary | ICD-10-CM

## 2018-05-19 DIAGNOSIS — E119 Type 2 diabetes mellitus without complications: Secondary | ICD-10-CM | POA: Insufficient documentation

## 2018-05-19 DIAGNOSIS — I10 Essential (primary) hypertension: Secondary | ICD-10-CM | POA: Insufficient documentation

## 2018-05-19 DIAGNOSIS — Z79890 Hormone replacement therapy (postmenopausal): Secondary | ICD-10-CM | POA: Insufficient documentation

## 2018-05-19 DIAGNOSIS — Z79899 Other long term (current) drug therapy: Secondary | ICD-10-CM | POA: Insufficient documentation

## 2018-05-19 DIAGNOSIS — Z6837 Body mass index (BMI) 37.0-37.9, adult: Secondary | ICD-10-CM | POA: Insufficient documentation

## 2018-05-19 DIAGNOSIS — Z09 Encounter for follow-up examination after completed treatment for conditions other than malignant neoplasm: Secondary | ICD-10-CM | POA: Insufficient documentation

## 2018-05-19 DIAGNOSIS — Z7984 Long term (current) use of oral hypoglycemic drugs: Secondary | ICD-10-CM | POA: Diagnosis not present

## 2018-05-19 DIAGNOSIS — E785 Hyperlipidemia, unspecified: Secondary | ICD-10-CM | POA: Diagnosis not present

## 2018-05-19 DIAGNOSIS — Z7982 Long term (current) use of aspirin: Secondary | ICD-10-CM | POA: Insufficient documentation

## 2018-05-19 DIAGNOSIS — Z8709 Personal history of other diseases of the respiratory system: Secondary | ICD-10-CM | POA: Diagnosis not present

## 2018-05-19 DIAGNOSIS — E039 Hypothyroidism, unspecified: Secondary | ICD-10-CM | POA: Diagnosis not present

## 2018-05-19 DIAGNOSIS — E669 Obesity, unspecified: Secondary | ICD-10-CM | POA: Insufficient documentation

## 2018-05-19 DIAGNOSIS — Z7951 Long term (current) use of inhaled steroids: Secondary | ICD-10-CM | POA: Diagnosis not present

## 2018-05-19 DIAGNOSIS — J45909 Unspecified asthma, uncomplicated: Secondary | ICD-10-CM | POA: Insufficient documentation

## 2018-05-19 DIAGNOSIS — Z9889 Other specified postprocedural states: Secondary | ICD-10-CM | POA: Diagnosis not present

## 2018-05-19 HISTORY — PX: VIDEO BRONCHOSCOPY: SHX5072

## 2018-05-19 LAB — GLUCOSE, CAPILLARY
GLUCOSE-CAPILLARY: 149 mg/dL — AB (ref 70–99)
GLUCOSE-CAPILLARY: 138 mg/dL — AB (ref 70–99)

## 2018-05-19 SURGERY — BRONCHOSCOPY, VIDEO-ASSISTED
Anesthesia: Monitor Anesthesia Care

## 2018-05-19 MED ORDER — PROPOFOL 10 MG/ML IV BOLUS
INTRAVENOUS | Status: AC
  Filled 2018-05-19: qty 20

## 2018-05-19 MED ORDER — MIDAZOLAM HCL 2 MG/2ML IJ SOLN
INTRAMUSCULAR | Status: AC
  Filled 2018-05-19: qty 2

## 2018-05-19 MED ORDER — LIDOCAINE 2% (20 MG/ML) 5 ML SYRINGE
INTRAMUSCULAR | Status: AC
  Filled 2018-05-19: qty 5

## 2018-05-19 MED ORDER — MIDAZOLAM HCL 5 MG/5ML IJ SOLN
INTRAMUSCULAR | Status: DC | PRN
  Administered 2018-05-19: 2 mg via INTRAVENOUS

## 2018-05-19 MED ORDER — PROPOFOL 10 MG/ML IV BOLUS
INTRAVENOUS | Status: DC | PRN
  Administered 2018-05-19 (×2): 50 mg via INTRAVENOUS

## 2018-05-19 MED ORDER — 0.9 % SODIUM CHLORIDE (POUR BTL) OPTIME
TOPICAL | Status: DC | PRN
  Administered 2018-05-19: 1000 mL

## 2018-05-19 MED ORDER — FENTANYL CITRATE (PF) 100 MCG/2ML IJ SOLN
INTRAMUSCULAR | Status: DC | PRN
  Administered 2018-05-19 (×2): 25 ug via INTRAVENOUS

## 2018-05-19 MED ORDER — PROPOFOL 500 MG/50ML IV EMUL
INTRAVENOUS | Status: DC | PRN
  Administered 2018-05-19: 50 ug/kg/min via INTRAVENOUS

## 2018-05-19 MED ORDER — FENTANYL CITRATE (PF) 250 MCG/5ML IJ SOLN
INTRAMUSCULAR | Status: AC
  Filled 2018-05-19: qty 5

## 2018-05-19 MED ORDER — ONDANSETRON HCL 4 MG/2ML IJ SOLN
INTRAMUSCULAR | Status: DC | PRN
  Administered 2018-05-19: 4 mg via INTRAVENOUS

## 2018-05-19 MED ORDER — BUTAMBEN-TETRACAINE-BENZOCAINE 2-2-14 % EX AERO
INHALATION_SPRAY | CUTANEOUS | Status: DC | PRN
  Administered 2018-05-19: 2 via TOPICAL

## 2018-05-19 MED ORDER — LIDOCAINE 2% (20 MG/ML) 5 ML SYRINGE
INTRAMUSCULAR | Status: DC | PRN
  Administered 2018-05-19: 60 mg via INTRAVENOUS

## 2018-05-19 MED ORDER — PROPOFOL 10 MG/ML IV BOLUS
INTRAVENOUS | Status: AC
  Filled 2018-05-19: qty 40

## 2018-05-19 MED ORDER — ROCURONIUM BROMIDE 50 MG/5ML IV SOSY
PREFILLED_SYRINGE | INTRAVENOUS | Status: AC
  Filled 2018-05-19: qty 5

## 2018-05-19 MED ORDER — LACTATED RINGERS IV SOLN
INTRAVENOUS | Status: DC | PRN
  Administered 2018-05-19: 07:00:00 via INTRAVENOUS

## 2018-05-19 MED ORDER — ONDANSETRON HCL 4 MG/2ML IJ SOLN
INTRAMUSCULAR | Status: AC
  Filled 2018-05-19: qty 2

## 2018-05-19 SURGICAL SUPPLY — 35 items
ADAPTER VALVE BIOPSY EBUS (MISCELLANEOUS) IMPLANT
ADPTR VALVE BIOPSY EBUS (MISCELLANEOUS)
BLOCK BITE 60FR ADLT L/F BLUE (MISCELLANEOUS) ×3 IMPLANT
BRUSH CYTOL CELLEBRITY 1.5X140 (MISCELLANEOUS) IMPLANT
CANISTER SUCT 3000ML PPV (MISCELLANEOUS) ×3 IMPLANT
CONT SPEC 4OZ CLIKSEAL STRL BL (MISCELLANEOUS) ×6 IMPLANT
COVER BACK TABLE 60X90IN (DRAPES) ×3 IMPLANT
COVER WAND RF STERILE (DRAPES) ×3 IMPLANT
FILTER STRAW FLUID ASPIR (MISCELLANEOUS) IMPLANT
FORCEPS BIOP RJ4 1.8 (CUTTING FORCEPS) IMPLANT
FORCEPS RADIAL JAW LRG 4 PULM (INSTRUMENTS) IMPLANT
GAUZE SPONGE 4X4 12PLY STRL (GAUZE/BANDAGES/DRESSINGS) ×3 IMPLANT
GLOVE SURG SIGNA 7.5 PF LTX (GLOVE) ×3 IMPLANT
GOWN STRL REUS W/ TWL LRG LVL3 (GOWN DISPOSABLE) ×1 IMPLANT
GOWN STRL REUS W/ TWL XL LVL3 (GOWN DISPOSABLE) ×1 IMPLANT
GOWN STRL REUS W/TWL LRG LVL3 (GOWN DISPOSABLE) ×2
GOWN STRL REUS W/TWL XL LVL3 (GOWN DISPOSABLE) ×2
KIT CLEAN ENDO COMPLIANCE (KITS) ×3 IMPLANT
KIT TURNOVER KIT B (KITS) ×3 IMPLANT
MARKER SKIN DUAL TIP RULER LAB (MISCELLANEOUS) ×3 IMPLANT
NS IRRIG 1000ML POUR BTL (IV SOLUTION) ×3 IMPLANT
OIL SILICONE PENTAX (PARTS (SERVICE/REPAIRS)) ×3 IMPLANT
PAD ARMBOARD 7.5X6 YLW CONV (MISCELLANEOUS) ×6 IMPLANT
RADIAL JAW LRG 4 PULMONARY (INSTRUMENTS)
SYR 20ML ECCENTRIC (SYRINGE) ×6 IMPLANT
SYR 5ML LL (SYRINGE) ×3 IMPLANT
SYR 5ML LUER SLIP (SYRINGE) ×3 IMPLANT
TOWEL GREEN STERILE (TOWEL DISPOSABLE) ×3 IMPLANT
TOWEL GREEN STERILE FF (TOWEL DISPOSABLE) ×3 IMPLANT
TRAP SPECIMEN MUCOUS 40CC (MISCELLANEOUS) ×3 IMPLANT
TUBE CONNECTING 20'X1/4 (TUBING) ×1
TUBE CONNECTING 20X1/4 (TUBING) ×2 IMPLANT
VALVE BIOPSY  SINGLE USE (MISCELLANEOUS) ×2
VALVE BIOPSY SINGLE USE (MISCELLANEOUS) ×1 IMPLANT
VALVE SUCTION BRONCHIO DISP (MISCELLANEOUS) ×3 IMPLANT

## 2018-05-19 NOTE — Interval H&P Note (Signed)
History and Physical Interval Note:  05/19/2018 7:28 AM  Caitlyn Anderson  has presented today for surgery, with the diagnosis of TRACHEAL STRICTURE  The various methods of treatment have been discussed with the patient and family. After consideration of risks, benefits and other options for treatment, the patient has consented to  Procedure(s): VIDEO BRONCHOSCOPY (N/A) as a surgical intervention .  The patient's history has been reviewed, patient examined, no change in status, stable for surgery.  I have reviewed the patient's chart and labs.  Questions were answered to the patient's satisfaction.     Melrose Nakayama

## 2018-05-19 NOTE — Anesthesia Procedure Notes (Signed)
Procedure Name: General with mask airway Date/Time: 05/19/2018 7:35 AM Performed by: Alain Marion, CRNA Pre-anesthesia Checklist: Patient identified, Emergency Drugs available, Suction available and Patient being monitored Patient Re-evaluated:Patient Re-evaluated prior to induction Placement Confirmation: positive ETCO2

## 2018-05-19 NOTE — Discharge Instructions (Addendum)
Do not drive or engage in heavy physical activity for 24 hours  You may resume normal activities tomorrow  You may use acetaminophen (Tylenol) if needed for discomfort  My office will call to schedule a follow up appointment for 6 months from now

## 2018-05-19 NOTE — Brief Op Note (Signed)
05/19/2018  8:03 AM  PATIENT:  Caitlyn Anderson  57 y.o. female  PRE-OPERATIVE DIAGNOSIS:  TRACHEAL STRICTURE  POST-OPERATIVE DIAGNOSIS:  TRACHEAL STRICTURE  PROCEDURE:  Procedure(s): VIDEO BRONCHOSCOPY (N/A)  SURGEON:  Surgeon(s) and Role:    * Melrose Nakayama, MD - Primary  PHYSICIAN ASSISTANT:   ASSISTANTS: none   ANESTHESIA:   IV sedation  EBL:  None   BLOOD ADMINISTERED:none  DRAINS: none   LOCAL MEDICATIONS USED:  Cetacaine spray  SPECIMEN:  No Specimen  DISPOSITION OF SPECIMEN:  N/A  COUNTS:  NO -  TOURNIQUET:  * No tourniquets in log *  DICTATION: .Other Dictation: Dictation Number -  PLAN OF CARE: Discharge to home after PACU  PATIENT DISPOSITION:  PACU - hemodynamically stable.   Delay start of Pharmacological VTE agent (>24hrs) due to surgical blood loss or risk of bleeding: not applicable  FINDINGS- widely patent trachea with minimal scarring

## 2018-05-19 NOTE — Transfer of Care (Signed)
Immediate Anesthesia Transfer of Care Note  Patient: Caitlyn Anderson  Procedure(s) Performed: VIDEO BRONCHOSCOPY (N/A )  Patient Location: PACU  Anesthesia Type:MAC  Level of Consciousness: awake, alert  and oriented  Airway & Oxygen Therapy: Patient Spontanous Breathing and Patient connected to nasal cannula oxygen  Post-op Assessment: Report given to RN and Post -op Vital signs reviewed and stable  Post vital signs: Reviewed and stable  Last Vitals:  Vitals Value Taken Time  BP 113/80 05/19/2018  8:06 AM  Temp 36.6 C 05/19/2018  8:06 AM  Pulse 76 05/19/2018  8:09 AM  Resp 16 05/19/2018  8:09 AM  SpO2 92 % 05/19/2018  8:09 AM  Vitals shown include unvalidated device data.  Last Pain:  Vitals:   05/19/18 0806  TempSrc:   PainSc: 0-No pain         Complications: No apparent anesthesia complications

## 2018-05-19 NOTE — Anesthesia Postprocedure Evaluation (Signed)
Anesthesia Post Note  Patient: Caitlyn Anderson  Procedure(s) Performed: VIDEO BRONCHOSCOPY (N/A )     Patient location during evaluation: PACU Anesthesia Type: MAC Level of consciousness: awake and alert Pain management: pain level controlled Vital Signs Assessment: post-procedure vital signs reviewed and stable Respiratory status: spontaneous breathing Cardiovascular status: stable Anesthetic complications: no    Last Vitals:  Vitals:   05/19/18 0806 05/19/18 0820  BP: 113/80 109/75  Pulse: 79 76  Resp: 15 14  Temp: 36.6 C   SpO2: 98% 92%    Last Pain:  Vitals:   05/19/18 0806  TempSrc:   PainSc: 0-No pain                 Nolon Nations

## 2018-05-20 ENCOUNTER — Encounter (HOSPITAL_COMMUNITY): Payer: Self-pay | Admitting: Thoracic Surgery (Cardiothoracic Vascular Surgery)

## 2018-05-20 NOTE — Op Note (Signed)
NAME: Caitlyn Anderson, Caitlyn Anderson MEDICAL RECORD [id number] ACCOUNT [id number] DATE OF BIRTH:10/02/1960 FACILITY: [facility] LOCATION: [facility] PHYSICIAN:Weslie Pretlow Chaya Jan, MD  OPERATIVE REPORT  DATE OF PROCEDURE:  05/19/2018  PREOPERATIVE DIAGNOSIS:  Status post laser resection of tracheal stenosis.  POSTOPERATIVE DIAGNOSIS:  Status post laser resection of tracheal stenosis.  PROCEDURE:  Bronchoscopy.  SURGEON:  Modesto Charon, MD  ASSISTANT:  None.  ANESTHESIA:  Intravenous sedation and local.  FINDINGS:  Good result post-resection with minimal scarring along the right anterolateral portion of the trachea.  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  About five days postoperatively, she developed stridor and was admitted and  treated with steroids.  She then recovered and has been doing well from a respiratory standpoint since then.  She was advised to undergo a relook bronchoscopy.  The indications, risks, benefits, and alternatives were discussed in detail with the patient.   She understood and accepted the risks and agreed to proceed.  DESCRIPTION OF PROCEDURE:  The patient was brought to the operating room on 05/19/2018.  She was given intravenous sedation and monitored by the Anesthesia service.  Cetacaine spray was sprayed into the oropharynx.  Flexible fiberoptic bronchoscopy then  was performed.  The cords were passed easily. Just beyond the cords, the site of the previous tracheal stenosis was identified.  The trachea was widely patent in that area.  Along the right anterior lateral area, there was a very small amount of scar  tissue versus residual web, estimated 98% of the tracheal circumference was patent.  The bronchoscope was removed.  The patient was then taken from the operating room to the [facility] in good condition.  TN/NUANCE   D:05/19/2018 T:05/20/2018 JOB:[id number]

## 2018-05-27 ENCOUNTER — Other Ambulatory Visit: Payer: Self-pay | Admitting: *Deleted

## 2018-05-27 MED ORDER — VALSARTAN 160 MG PO TABS: 160.0000 mg | ORAL_TABLET | Freq: Every day | ORAL | 2 refills | Status: DC

## 2018-06-10 ENCOUNTER — Ambulatory Visit: Payer: Self-pay | Admitting: Orthopedic Surgery

## 2018-06-20 ENCOUNTER — Ambulatory Visit: Payer: Self-pay | Admitting: Orthopedic Surgery

## 2018-06-20 NOTE — Patient Instructions (Addendum)
Caitlyn Anderson  06/20/2018   Your procedure is scheduled on: 06-26-18    Report to [facility] Main  Entrance    Report to Admitting at 5:30 AM    Call this number if you have problems the morning of surgery [phone]    Remember: Do not eat food or drink liquids :After Midnight.     BRUSH YOUR TEETH MORNING OF SURGERY AND RINSE YOUR MOUTH OUT, NO CHEWING GUM CANDY OR MINTS.     Take these medicines the morning of surgery with A SIP OF WATER: Levothyroxine (Synthroid). You may use and bring your inhaler and nasal spray as needed.   DO NOT TAKE ANY DIABETIC MEDICATIONS DAY OF YOUR SURGERY                               You may not have any metal on your body including hair pins and              piercings  Do not wear jewelry, make-up, lotions, powders or perfumes, deodorant             Do not wear nail polish.  Do not shave  48 hours prior to surgery.              Do not bring valuables to the hospital. [place].  Contacts, dentures or bridgework may not be worn into surgery.  Leave suitcase in the car. After surgery it may be brought to your room.       Special Instructions: N/A              Please read over the following fact sheets you were given: _____________________________________________________________________             How to Manage Your Diabetes Before and After Surgery  Why is it important to control my blood sugar before and after surgery? . Improving blood sugar levels before and after surgery helps healing and can limit problems. . A way of improving blood sugar control is eating a healthy diet by: o  Eating less sugar and carbohydrates o  Increasing activity/exercise o  Talking with your doctor about reaching your blood sugar goals . High blood sugars (greater than 180 mg/dL) can raise your risk of infections and slow your recovery, so you will need to focus on controlling  your diabetes during the weeks before surgery. . Make sure that the doctor who takes care of your diabetes knows about your planned surgery including the date and location.  How do I manage my blood sugar before surgery? . Check your blood sugar at least 4 times a day, starting 2 days before surgery, to make sure that the level is not too high or low. o Check your blood sugar the morning of your surgery when you wake up and every 2 hours until you get to the Short Stay unit. . If your blood sugar is less than 70 mg/dL, you will need to treat for low blood sugar: o Do not take insulin. o Treat a low blood sugar (less than 70 mg/dL) with  cup of clear juice (cranberry or apple), 4 glucose tablets, OR glucose gel. o Recheck blood sugar in 15 minutes after treatment (to make  sure it is greater than 70 mg/dL). If your blood sugar is not greater than 70 mg/dL on recheck, call [phone] for further instructions. . Report your blood sugar to the short stay nurse when you get to Short Stay.  . If you are admitted to the hospital after surgery: o Your blood sugar will be checked by the staff and you will probably be given insulin after surgery (instead of oral diabetes medicines) to make sure you have good blood sugar levels. o The goal for blood sugar control after surgery is 80-180 mg/dL.   WHAT DO I DO ABOUT MY DIABETES MEDICATION?  Marland Kitchen Do not take oral diabetes medicines (pills) the morning of surgery.  . THE DAY BEFORE SURGERY, take your usual dose of Metformin          Reviewed and Endorsed by [facility] Patient Education Committee, August 2015    [facility] - Preparing for Surgery Before surgery, you can play an important role.  Because skin is not sterile, your skin needs to be as free of germs as possible.  You can reduce the number of germs on your skin by washing with CHG (chlorahexidine gluconate) soap before surgery.  CHG is an antiseptic cleaner which kills germs and bonds with  the skin to continue killing germs even after washing. Please DO NOT use if you have an allergy to CHG or antibacterial soaps.  If your skin becomes reddened/irritated stop using the CHG and inform your nurse when you arrive at Short Stay. Do not shave (including legs and underarms) for at least 48 hours prior to the first CHG shower.  You may shave your face/neck. Please follow these instructions carefully:  1.  Shower with CHG Soap the night before surgery and the  morning of Surgery.  2.  If you choose to wash your hair, wash your hair first as usual with your  normal  shampoo.  3.  After you shampoo, rinse your hair and body thoroughly to remove the  shampoo.                           4.  Use CHG as you would any other liquid soap.  You can apply chg directly  to the skin and wash                       Gently with a scrungie or clean washcloth.  5.  Apply the CHG Soap to your body ONLY FROM THE NECK DOWN.   Do not use on face/ open                           Wound or open sores. Avoid contact with eyes, ears mouth and genitals (private parts).                       Wash face,  Genitals (private parts) with your normal soap.             6.  Wash thoroughly, paying special attention to the area where your surgery  will be performed.  7.  Thoroughly rinse your body with warm water from the neck down.  8.  DO NOT shower/wash with your normal soap after using and rinsing off  the CHG Soap.                9.  Pat yourself  dry with a clean towel.            10.  Wear clean pajamas.            11.  Place clean sheets on your bed the night of your first shower and do not  sleep with pets. Day of Surgery : Do not apply any lotions/deodorants the morning of surgery.  Please wear clean clothes to the hospital/surgery center.  FAILURE TO FOLLOW THESE INSTRUCTIONS MAY RESULT IN THE CANCELLATION OF YOUR SURGERY PATIENT SIGNATURE_________________________________  NURSE  SIGNATURE__________________________________  ________________________________________________________________________  [name]  An incentive spirometer is a tool that can help keep your lungs clear and active. This tool measures how well you are filling your lungs with each breath. Taking long deep breaths may help reverse or decrease the chance of developing breathing (pulmonary) problems (especially infection) following:  A long period of time when you are unable to move or be active. BEFORE THE PROCEDURE   If the spirometer includes an indicator to show your best effort, your nurse or respiratory therapist will set it to a desired goal.  If possible, sit up straight or lean slightly forward. Try not to slouch.  Hold the incentive spirometer in an upright position. INSTRUCTIONS FOR USE  1. Sit on the edge of your bed if possible, or sit up as far as you can in bed or on a chair. 2. Hold the incentive spirometer in an upright position. 3. Breathe out normally. 4. Place the mouthpiece in your mouth and seal your lips tightly around it. 5. Breathe in slowly and as deeply as possible, raising the piston or the ball toward the top of the column. 6. Hold your breath for 3-5 seconds or for as long as possible. Allow the piston or ball to fall to the bottom of the column. 7. Remove the mouthpiece from your mouth and breathe out normally. 8. Rest for a few seconds and repeat Steps 1 through 7 at least 10 times every 1-2 hours when you are awake. Take your time and take a few normal breaths between deep breaths. 9. The spirometer may include an indicator to show your best effort. Use the indicator as a goal to work toward during each repetition. 10. After each set of 10 deep breaths, practice coughing to be sure your lungs are clear. If you have an incision (the cut made at the time of surgery), support your incision when coughing by placing a pillow or rolled up towels firmly against  it. Once you are able to get out of bed, walk around indoors and cough well. You may stop using the incentive spirometer when instructed by your caregiver.  RISKS AND COMPLICATIONS  Take your time so you do not get dizzy or light-headed.  If you are in pain, you may need to take or ask for pain medication before doing incentive spirometry. It is harder to take a deep breath if you are having pain. AFTER USE  Rest and breathe slowly and easily.  It can be helpful to keep track of a log of your progress. Your caregiver can provide you with a simple table to help with this. If you are using the spirometer at home, follow these instructions: [place] IF:   You are having difficultly using the spirometer.  You have trouble using the spirometer as often as instructed.  Your pain medication is not giving enough relief while using the spirometer.  You develop fever of 100.5 F (  38.1 C) or higher. SEEK IMMEDIATE MEDICAL CARE IF:   You cough up bloody sputum that had not been present before.  You develop fever of 102 F (38.9 C) or greater.  You develop worsening pain at or near the incision site. MAKE SURE YOU:   Understand these instructions.  Will watch your condition.  Will get help right away if you are not doing well or get worse. Document Released: 11/05/2006 Document Revised: 09/17/2011 Document Reviewed: 01/06/2007 Exit[employer] Patient Information 2014 ExitCare, [place].   ________________________________________________________________________  WHAT IS A BLOOD TRANSFUSION? Blood Transfusion Information  A transfusion is the replacement of blood or some of its parts. Blood is made up of multiple cells which provide different functions.  Red blood cells carry oxygen and are used for blood loss replacement.  White blood cells fight against infection.  Platelets control bleeding.  Plasma helps clot blood.  Other blood products are available for specialized  needs, such as hemophilia or other clotting disorders. BEFORE THE TRANSFUSION  Who gives blood for transfusions?   Healthy volunteers who are fully evaluated to make sure their blood is safe. This is blood bank blood. Transfusion therapy is the safest it has ever been in the practice of medicine. Before blood is taken from a donor, a complete history is taken to make sure that person has no history of diseases nor engages in risky social behavior (examples are intravenous drug use or sexual activity with multiple partners). The donor's travel history is screened to minimize risk of transmitting infections, such as malaria. The donated blood is tested for signs of infectious diseases, such as HIV and hepatitis. The blood is then tested to be sure it is compatible with you in order to minimize the chance of a transfusion reaction. If you or a relative donates blood, this is often done in anticipation of surgery and is not appropriate for emergency situations. It takes many days to process the donated blood. RISKS AND COMPLICATIONS Although transfusion therapy is very safe and saves many lives, the main dangers of transfusion include:   Getting an infectious disease.  Developing a transfusion reaction. This is an allergic reaction to something in the blood you were given. Every precaution is taken to prevent this. The decision to have a blood transfusion has been considered carefully by your caregiver before blood is given. Blood is not given unless the benefits outweigh the risks. AFTER THE TRANSFUSION  Right after receiving a blood transfusion, you will usually feel much better and more energetic. This is especially true if your red blood cells have gotten low (anemic). The transfusion raises the level of the red blood cells which carry oxygen, and this usually causes an energy increase.  The nurse administering the transfusion will monitor you carefully for complications. HOME CARE INSTRUCTIONS   No special instructions are needed after a transfusion. You may find your energy is better. Speak with your caregiver about any limitations on activity for underlying diseases you may have. SEEK MEDICAL CARE IF:   Your condition is not improving after your transfusion.  You develop redness or irritation at the intravenous (IV) site. SEEK IMMEDIATE MEDICAL CARE IF:  Any of the following symptoms occur over the next 12 hours:  Shaking chills.  You have a temperature by mouth above 102 F (38.9 C), not controlled by medicine.  Chest, back, or muscle pain.  People around you feel you are not acting correctly or are confused.  Shortness of breath or difficulty breathing.  Dizziness and fainting.  You get a rash or develop hives.  You have a decrease in urine output.  Your urine turns a dark color or changes to pink, red, or brown. Any of the following symptoms occur over the next 10 days:  You have a temperature by mouth above 102 F (38.9 C), not controlled by medicine.  Shortness of breath.  Weakness after normal activity.  The white part of the eye turns yellow (jaundice).  You have a decrease in the amount of urine or are urinating less often.  Your urine turns a dark color or changes to pink, red, or brown. Document Released: 06/22/2000 Document Revised: 09/17/2011 Document Reviewed: 02/09/2008 [facility] [employer] Patient Information 2014 [place], [place].  _______________________________________________________________________

## 2018-06-20 NOTE — Progress Notes (Signed)
05-19-18 (Epic) CXR  03-29-18 (Epic) EKG  03-24-18 Pulmonary Clearance from Dr. Malvin Johns on chart  12-30-17  Medical Clearance from Dr. Lunette Stands on chart  6-19  Cardiac Clearance on chart

## 2018-06-20 NOTE — H&P (Signed)
TOTAL HIP ADMISSION H&P  Patient is admitted for left total hip arthroplasty.  Subjective:  Chief Complaint: left hip pain  HPI: Caitlyn Anderson, 57 y.o. female, has a history of pain and functional disability in the left hip(s) due to arthritis and patient has failed non-surgical conservative treatments for greater than 12 weeks to include NSAID's and/or analgesics, flexibility and strengthening excercises, supervised PT with diminished ADL's post treatment, use of assistive devices, weight reduction as appropriate and activity modification.  Onset of symptoms was gradual starting 3 years ago with gradually worsening course since that time.The patient noted no past surgery on the left hip(s).  Patient currently rates pain in the left hip at 10 out of 10 with activity. Patient has night pain, worsening of pain with activity and weight bearing, trendelenberg gait, pain that interfers with activities of daily living and pain with passive range of motion. Patient has evidence of subchondral cysts, subchondral sclerosis, periarticular osteophytes and joint space narrowing by imaging studies. This condition presents safety issues increasing the risk of falls. This patient has had hip dysplasia.  There is no current active infection.  Patient Active Problem List   Diagnosis Date Noted  . Diabetes ([place]) 04/08/2018  . Leucocytosis 04/08/2018  . Prerenal azotemia 04/08/2018  . Debility   . Primary osteoarthritis of left hip   . 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 Tracheal stricture   . Wheezing 12/16/2017   Past Medical History:  Diagnosis Date  . Allergic rhinitis   . Asthma   . 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 Dyspnea   . GERD (gastroesophageal reflux disease)   . Hyperlipidemia   . Hypertension   . Hypothyroidism   . Left bundle branch block   . Tracheal stenosis     Past Surgical History:  Procedure Laterality Date  . BREAST SURGERY Left    lumpectomy  . CESAREAN  SECTION    . COLONOSCOPY    . DILATION AND CURETTAGE OF UTERUS    . FLEXIBLE BRONCHOSCOPY N/A 03/24/2018   Procedure: FLEXIBLE BRONCHOSCOPY;  Surgeon: Melrose Nakayama, MD;  Location: [facility];  Service: Thoracic;  Laterality: N/A;  . LASER BRONCHOSCOPY N/A 03/24/2018   Procedure: LASER BRONCHOSCOPY FOR RESECTION OF TRACHEAL WEB;  Surgeon: Melrose Nakayama, MD;  Location: [facility] OR;  Service: Thoracic;  Laterality: N/A;  . TONSILLECTOMY    . VIDEO BRONCHOSCOPY Bilateral 02/03/2018   Procedure: VIDEO BRONCHOSCOPY WITHOUT FLUORO;  Surgeon: Collene Gobble, MD;  Location: [facility] ENDOSCOPY;  Service: Cardiopulmonary;  Laterality: Bilateral;  . VIDEO BRONCHOSCOPY N/A 05/19/2018   Procedure: VIDEO BRONCHOSCOPY;  Surgeon: Melrose Nakayama, MD;  Location: [facility] OR;  Service: Thoracic;  Laterality: N/A;    Current Outpatient Medications  Medication Sig Dispense Refill Last Dose  . acetaminophen (TYLENOL) 500 MG tablet Take 2 tablets (1,000 mg total) by mouth every 8 (eight) hours as needed for mild pain. (Patient not taking: Reported on 06/18/2018) 30 tablet 0 Not Taking at Unknown time  . albuterol (PROAIR HFA) 108 (90 Base) MCG/ACT inhaler Inhale 2 puffs into the lungs 4 (four) times daily as needed for wheezing or shortness of breath.    05/18/2018 at Unknown time  . aspirin 81 MG tablet Take 81 mg by mouth daily.    05/18/2018 at Unknown time  . atorvastatin (LIPITOR) 20 MG tablet Take 1 tablet (20 mg total) by mouth daily. 30 tablet 0 Taking  . blood glucose meter kit and  supplies KIT Dispense based on patient and insurance preference. Use up to four times daily as directed. Dx: E11.9 (Patient not taking: Reported on 06/18/2018) 1 each 0 Not Taking at Unknown time  . Carboxymethylcell-Hypromellose (GENTEAL OP) Place 1 drop into both eyes at bedtime as needed (for dry eyes).     . cetirizine (ZYRTEC) 10 MG tablet Take 10 mg by mouth at bedtime.    05/18/2018 at Unknown time  . Cholecalciferol (VITAMIN  D3) 50 MCG (2000 UT) TABS Take 2,000 Units by mouth daily.     Marland Kitchen escitalopram (LEXAPRO) 10 MG tablet Take 10 mg by mouth daily.    05/18/2018 at Unknown time  . fluticasone (FLONASE) 50 MCG/ACT nasal spray Place 1-2 sprays into both nostrils daily as needed for allergies.    Taking  . ibuprofen (ADVIL,MOTRIN) 200 MG tablet Take 600 mg by mouth every 6 (six) hours as needed for headache or moderate pain.     Marland Kitchen levothyroxine (SYNTHROID, LEVOTHROID) 75 MCG tablet Take 75 mcg by mouth daily before breakfast.   05/19/2018 at Unknown time  . metFORMIN (GLUCOPHAGE) 1000 MG tablet Take 1,000 mg by mouth daily.    05/18/2018 at Unknown time  . valsartan (DIOVAN) 160 MG tablet Take 1 tablet (160 mg total) by mouth daily. 30 tablet 2    No current facility-administered medications for this visit.    Allergies  Allergen Reactions  . Penicillins Hives, Rash and Other (See Comments)    Has patient had a PCN reaction causing immediate rash, facial/tongue/throat swelling, SOB or lightheadedness with hypotension: No Has patient had a PCN reaction causing severe rash involving mucus membranes or skin necrosis: No Has patient had a PCN reaction that required hospitalization: No Has patient had a PCN reaction occurring within the last 10 years: No If all of the above answers are "NO", then may proceed with Cephalosporin use.     Social History   Tobacco Use  . Smoking status: Never Smoker  . Smokeless tobacco: Never Used  Substance Use Topics  . Alcohol use: Yes    Comment: 1-2 times a year    Family History  Problem Relation Age of Onset  . Glaucoma Father   . Macular degeneration Maternal Grandmother      Review of Systems  Constitutional: Positive for malaise/fatigue.  HENT: Positive for tinnitus.   Eyes: Negative.   Respiratory: Negative.   Cardiovascular: Negative.   Gastrointestinal: Positive for heartburn.  Genitourinary: Negative.   Musculoskeletal: Positive for joint pain and  myalgias.  Skin: Positive for itching.  Neurological: Negative.   Endo/Heme/Allergies: Positive for environmental allergies.  Psychiatric/Behavioral: Negative.     Objective:  Physical Exam  Vitals reviewed. Constitutional: She is oriented to person, place, and time. She appears well-developed and well-nourished.  HENT:  Head: Normocephalic and atraumatic.  Eyes: Pupils are equal, round, and reactive to light. Conjunctivae and EOM are normal.  Neck: Normal range of motion. Neck supple.  Cardiovascular: Normal rate, regular rhythm and intact distal pulses.  Respiratory: Effort normal. No respiratory distress.  GI: Soft. She exhibits no distension.  Genitourinary:    Genitourinary Comments: deferred   Musculoskeletal:     Left hip: She exhibits decreased range of motion and bony tenderness.  Neurological: She is alert and oriented to person, place, and time. She has normal reflexes.  Skin: Skin is warm and dry.  Psychiatric: She has a normal mood and affect. Her behavior is normal. Judgment and thought content normal.  Vital signs in last 24 hours: _0 @  Labs:   Estimated body mass index is 37.89 kg/m as calculated from the following:   Height as of 05/19/18: 5' (1.524 m).   Weight as of 05/19/18: 88 kg.   Imaging Review Plain radiographs demonstrate severe degenerative joint disease of the left hip(s). The bone quality appears to be adequate for age and reported activity level.    Preoperative templating of the joint replacement has been completed, documented, and submitted to the Operating Room personnel in order to optimize intra-operative equipment management.     Assessment/Plan:  End stage arthritis, left hip(s)  The patient history, physical examination, clinical judgement of the provider and imaging studies are consistent with end stage degenerative joint disease of the left hip(s) and total hip arthroplasty is deemed medically necessary. The  treatment options including medical management, injection therapy, arthroscopy and arthroplasty were discussed at length. The risks and benefits of total hip arthroplasty were presented and reviewed. The risks due to aseptic loosening, infection, stiffness, dislocation/subluxation,  thromboembolic complications and other imponderables were discussed.  The patient acknowledged the explanation, agreed to proceed with the plan and consent was signed. Patient is being admitted for inpatient treatment for surgery, pain control, PT, OT, prophylactic antibiotics, VTE prophylaxis, progressive ambulation and ADL's and discharge planning.The patient is planning to be discharged home with HEP

## 2018-06-20 NOTE — H&P (View-Only) (Signed)
TOTAL HIP ADMISSION H&P  Patient is admitted for left total hip arthroplasty.  Subjective:  Chief Complaint: left hip pain  HPI: Caitlyn Anderson, 57 y.o. female, has a history of pain and functional disability in the left hip(s) due to arthritis and patient has failed non-surgical conservative treatments for greater than 12 weeks to include NSAID's and/or analgesics, flexibility and strengthening excercises, supervised PT with diminished ADL's post treatment, use of assistive devices, weight reduction as appropriate and activity modification.  Onset of symptoms was gradual starting 3 years ago with gradually worsening course since that time.The patient noted no past surgery on the left hip(s).  Patient currently rates pain in the left hip at 10 out of 10 with activity. Patient has night pain, worsening of pain with activity and weight bearing, trendelenberg gait, pain that interfers with activities of daily living and pain with passive range of motion. Patient has evidence of subchondral cysts, subchondral sclerosis, periarticular osteophytes and joint space narrowing by imaging studies. This condition presents safety issues increasing the risk of falls. This patient has had hip dysplasia.  There is no current active infection.  Patient Active Problem List   Diagnosis Date Noted  . Diabetes ([place]) 04/08/2018  . Leucocytosis 04/08/2018  . Prerenal azotemia 04/08/2018  . Debility   . Primary osteoarthritis of left hip   . 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 Tracheal stricture   . Wheezing 12/16/2017   Past Medical History:  Diagnosis Date  . Allergic rhinitis   . Asthma   . 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 Dyspnea   . GERD (gastroesophageal reflux disease)   . Hyperlipidemia   . Hypertension   . Hypothyroidism   . Left bundle branch block   . Tracheal stenosis     Past Surgical History:  Procedure Laterality Date  . BREAST SURGERY Left    lumpectomy  . CESAREAN  SECTION    . COLONOSCOPY    . DILATION AND CURETTAGE OF UTERUS    . FLEXIBLE BRONCHOSCOPY N/A 03/24/2018   Procedure: FLEXIBLE BRONCHOSCOPY;  Surgeon: Melrose Nakayama, MD;  Location: [facility];  Service: Thoracic;  Laterality: N/A;  . LASER BRONCHOSCOPY N/A 03/24/2018   Procedure: LASER BRONCHOSCOPY FOR RESECTION OF TRACHEAL WEB;  Surgeon: Melrose Nakayama, MD;  Location: [facility] OR;  Service: Thoracic;  Laterality: N/A;  . TONSILLECTOMY    . VIDEO BRONCHOSCOPY Bilateral 02/03/2018   Procedure: VIDEO BRONCHOSCOPY WITHOUT FLUORO;  Surgeon: Collene Gobble, MD;  Location: [facility] ENDOSCOPY;  Service: Cardiopulmonary;  Laterality: Bilateral;  . VIDEO BRONCHOSCOPY N/A 05/19/2018   Procedure: VIDEO BRONCHOSCOPY;  Surgeon: Melrose Nakayama, MD;  Location: [facility] OR;  Service: Thoracic;  Laterality: N/A;    Current Outpatient Medications  Medication Sig Dispense Refill Last Dose  . acetaminophen (TYLENOL) 500 MG tablet Take 2 tablets (1,000 mg total) by mouth every 8 (eight) hours as needed for mild pain. (Patient not taking: Reported on 06/18/2018) 30 tablet 0 Not Taking at Unknown time  . albuterol (PROAIR HFA) 108 (90 Base) MCG/ACT inhaler Inhale 2 puffs into the lungs 4 (four) times daily as needed for wheezing or shortness of breath.    05/18/2018 at Unknown time  . aspirin 81 MG tablet Take 81 mg by mouth daily.    05/18/2018 at Unknown time  . atorvastatin (LIPITOR) 20 MG tablet Take 1 tablet (20 mg total) by mouth daily. 30 tablet 0 Taking  . blood glucose meter kit and  supplies KIT Dispense based on patient and insurance preference. Use up to four times daily as directed. Dx: E11.9 (Patient not taking: Reported on 06/18/2018) 1 each 0 Not Taking at Unknown time  . Carboxymethylcell-Hypromellose (GENTEAL OP) Place 1 drop into both eyes at bedtime as needed (for dry eyes).     . cetirizine (ZYRTEC) 10 MG tablet Take 10 mg by mouth at bedtime.    05/18/2018 at Unknown time  . Cholecalciferol (VITAMIN  D3) 50 MCG (2000 UT) TABS Take 2,000 Units by mouth daily.     Marland Kitchen escitalopram (LEXAPRO) 10 MG tablet Take 10 mg by mouth daily.    05/18/2018 at Unknown time  . fluticasone (FLONASE) 50 MCG/ACT nasal spray Place 1-2 sprays into both nostrils daily as needed for allergies.    Taking  . ibuprofen (ADVIL,MOTRIN) 200 MG tablet Take 600 mg by mouth every 6 (six) hours as needed for headache or moderate pain.     Marland Kitchen levothyroxine (SYNTHROID, LEVOTHROID) 75 MCG tablet Take 75 mcg by mouth daily before breakfast.   05/19/2018 at Unknown time  . metFORMIN (GLUCOPHAGE) 1000 MG tablet Take 1,000 mg by mouth daily.    05/18/2018 at Unknown time  . valsartan (DIOVAN) 160 MG tablet Take 1 tablet (160 mg total) by mouth daily. 30 tablet 2    No current facility-administered medications for this visit.    Allergies  Allergen Reactions  . Penicillins Hives, Rash and Other (See Comments)    Has patient had a PCN reaction causing immediate rash, facial/tongue/throat swelling, SOB or lightheadedness with hypotension: No Has patient had a PCN reaction causing severe rash involving mucus membranes or skin necrosis: No Has patient had a PCN reaction that required hospitalization: No Has patient had a PCN reaction occurring within the last 10 years: No If all of the above answers are "NO", then may proceed with Cephalosporin use.     Social History   Tobacco Use  . Smoking status: Never Smoker  . Smokeless tobacco: Never Used  Substance Use Topics  . Alcohol use: Yes    Comment: 1-2 times a year    Family History  Problem Relation Age of Onset  . Glaucoma Father   . Macular degeneration Maternal Grandmother      Review of Systems  Constitutional: Positive for malaise/fatigue.  HENT: Positive for tinnitus.   Eyes: Negative.   Respiratory: Negative.   Cardiovascular: Negative.   Gastrointestinal: Positive for heartburn.  Genitourinary: Negative.   Musculoskeletal: Positive for joint pain and  myalgias.  Skin: Positive for itching.  Neurological: Negative.   Endo/Heme/Allergies: Positive for environmental allergies.  Psychiatric/Behavioral: Negative.     Objective:  Physical Exam  Vitals reviewed. Constitutional: She is oriented to person, place, and time. She appears well-developed and well-nourished.  HENT:  Head: Normocephalic and atraumatic.  Eyes: Pupils are equal, round, and reactive to light. Conjunctivae and EOM are normal.  Neck: Normal range of motion. Neck supple.  Cardiovascular: Normal rate, regular rhythm and intact distal pulses.  Respiratory: Effort normal. No respiratory distress.  GI: Soft. She exhibits no distension.  Genitourinary:    Genitourinary Comments: deferred   Musculoskeletal:     Left hip: She exhibits decreased range of motion and bony tenderness.  Neurological: She is alert and oriented to person, place, and time. She has normal reflexes.  Skin: Skin is warm and dry.  Psychiatric: She has a normal mood and affect. Her behavior is normal. Judgment and thought content normal.  Vital signs in last 24 hours: _0 @  Labs:   Estimated body mass index is 37.89 kg/m as calculated from the following:   Height as of 05/19/18: 5' (1.524 m).   Weight as of 05/19/18: 88 kg.   Imaging Review Plain radiographs demonstrate severe degenerative joint disease of the left hip(s). The bone quality appears to be adequate for age and reported activity level.    Preoperative templating of the joint replacement has been completed, documented, and submitted to the Operating Room personnel in order to optimize intra-operative equipment management.     Assessment/Plan:  End stage arthritis, left hip(s)  The patient history, physical examination, clinical judgement of the provider and imaging studies are consistent with end stage degenerative joint disease of the left hip(s) and total hip arthroplasty is deemed medically necessary. The  treatment options including medical management, injection therapy, arthroscopy and arthroplasty were discussed at length. The risks and benefits of total hip arthroplasty were presented and reviewed. The risks due to aseptic loosening, infection, stiffness, dislocation/subluxation,  thromboembolic complications and other imponderables were discussed.  The patient acknowledged the explanation, agreed to proceed with the plan and consent was signed. Patient is being admitted for inpatient treatment for surgery, pain control, PT, OT, prophylactic antibiotics, VTE prophylaxis, progressive ambulation and ADL's and discharge planning.The patient is planning to be discharged home with HEP

## 2018-06-23 ENCOUNTER — Encounter (HOSPITAL_COMMUNITY): Payer: Self-pay

## 2018-06-23 ENCOUNTER — Encounter (HOSPITAL_COMMUNITY)
Admission: RE | Admit: 2018-06-23 | Discharge: 2018-06-23 | Disposition: A | Discharge status: Home / Self Care | Payer: BLUE CROSS/BLUE SHIELD | Source: Ambulatory Visit | Attending: Orthopedic Surgery | Admitting: Orthopedic Surgery

## 2018-06-23 ENCOUNTER — Other Ambulatory Visit: Payer: Self-pay

## 2018-06-23 DIAGNOSIS — Z01812 Encounter for preprocedural laboratory examination: Secondary | ICD-10-CM | POA: Insufficient documentation

## 2018-06-23 DIAGNOSIS — M1612 Unilateral primary osteoarthritis, left hip: Secondary | ICD-10-CM | POA: Insufficient documentation

## 2018-06-23 LAB — BASIC METABOLIC PANEL
Anion gap: 9 (ref 5–15)
BUN: 17 mg/dL (ref 6–20)
CALCIUM: 9.8 mg/dL (ref 8.9–10.3)
CO2: 24 mmol/L (ref 22–32)
Chloride: 105 mmol/L (ref 98–111)
Creatinine, Ser: 0.75 mg/dL (ref 0.44–1.00)
GFR calc Af Amer: >60 mL/min (ref >60)
GFR calc non Af Amer: >60 mL/min (ref >60)
Glucose, Bld: 103 mg/dL — ABNORMAL HIGH (ref 70–99)
Potassium: 4.3 mmol/L (ref 3.5–5.1)
Sodium: 138 mmol/L (ref 135–145)

## 2018-06-23 LAB — CBC
HCT: 43.7 % (ref 36.0–46.0)
Hemoglobin: 14.7 g/dL (ref 12.0–15.0)
MCH: 31.7 pg (ref 26.0–34.0)
MCHC: 33.6 g/dL (ref 30.0–36.0)
MCV: 94.4 fL (ref 80.0–100.0)
Platelets: 293 10*3/uL (ref 150–400)
RBC: 4.63 MIL/uL (ref 3.87–5.11)
RDW: 12.6 % (ref 11.5–15.5)
WBC: 10.1 10*3/uL (ref 4.0–10.5)
nRBC: 0 % (ref 0.0–0.2)

## 2018-06-23 LAB — ABO/RH: ABO/RH(D): O POS

## 2018-06-23 LAB — HEMOGLOBIN A1C
Hgb A1c MFr Bld: 6.3 % — ABNORMAL HIGH (ref 4.8–5.6)
Mean Plasma Glucose: 134.11 mg/dL

## 2018-06-23 LAB — SURGICAL PCR SCREEN
MRSA, PCR: NEGATIVE
Staphylococcus aureus: POSITIVE — AB

## 2018-06-23 LAB — GLUCOSE, CAPILLARY: Glucose-Capillary: 104 mg/dL — ABNORMAL HIGH (ref 70–99)

## 2018-06-23 NOTE — Progress Notes (Signed)
06-23-18 PCR result routed to Dr. Lyla Glassing for review

## 2018-06-25 ENCOUNTER — Encounter (HOSPITAL_COMMUNITY): Payer: Self-pay | Admitting: Anesthesiology

## 2018-06-25 NOTE — Anesthesia Preprocedure Evaluation (Addendum)
Anesthesia Evaluation  Patient identified by MRN, date of birth, ID band Patient awake    Reviewed: Allergy & Precautions, NPO status , Patient's Chart, lab work & pertinent test results  Airway Mallampati: III  TM Distance: <3 FB Neck ROM: Full    Dental  (+) Teeth Intact, Dental Advisory Given   Pulmonary asthma ,    breath sounds clear to auscultation       Cardiovascular hypertension, Pt. on medications + dysrhythmias  Rhythm:Regular Rate:Normal     Neuro/Psych negative neurological ROS     GI/Hepatic Neg liver ROS, GERD  ,  Endo/Other  diabetes, Type 2, Oral Hypoglycemic AgentsHypothyroidism   Renal/GU negative Renal ROS     Musculoskeletal  (+) Arthritis , Osteoarthritis,    Abdominal (+) + obese,   Peds  Hematology negative hematology ROS (+)   Anesthesia Other Findings  - HLD  Reproductive/Obstetrics                            Lab Results  Component Value Date   WBC 10.1 06/23/2018   HGB 14.7 06/23/2018   HCT 43.7 06/23/2018   MCV 94.4 06/23/2018   PLT 293 06/23/2018   Lab Results  Component Value Date   CREATININE 0.75 06/23/2018   BUN 17 06/23/2018   NA 138 06/23/2018   K 4.3 06/23/2018   CL 105 06/23/2018   CO2 24 06/23/2018   Lab Results  Component Value Date   INR 1.01 05/09/2018   INR 1.00 03/24/2018     Anesthesia Physical Anesthesia Plan  ASA: II  Anesthesia Plan: Spinal   Post-op Pain Management:    Induction: Intravenous  PONV Risk Score and Plan: 3 and Ondansetron, Propofol infusion and Midazolam  Airway Management Planned: Natural Airway and Simple Face Mask  Additional Equipment: None  Intra-op Plan:   Post-operative Plan:   Informed Consent: I have reviewed the patients History and Physical, chart, labs and discussed the procedure including the risks, benefits and alternatives for the proposed anesthesia with the patient or authorized  representative who has indicated his/her understanding and acceptance.     Plan Discussed with: CRNA  Anesthesia Plan Comments:        Anesthesia Quick Evaluation

## 2018-06-26 ENCOUNTER — Encounter (HOSPITAL_COMMUNITY)
Admission: RE | Disposition: A | Discharge status: Home / Self Care | Payer: Self-pay | Source: Home / Self Care | Attending: Orthopedic Surgery

## 2018-06-26 ENCOUNTER — Inpatient Hospital Stay (HOSPITAL_COMMUNITY)
Admission: RE | Admit: 2018-06-26 | Discharge: 2018-06-28 | DRG: 470 | Disposition: A | Discharge status: Home / Self Care | Payer: BLUE CROSS/BLUE SHIELD | Attending: Orthopedic Surgery | Admitting: Orthopedic Surgery

## 2018-06-26 ENCOUNTER — Inpatient Hospital Stay (HOSPITAL_COMMUNITY): Payer: BLUE CROSS/BLUE SHIELD | Admitting: Anesthesiology

## 2018-06-26 ENCOUNTER — Encounter (HOSPITAL_COMMUNITY): Payer: Self-pay

## 2018-06-26 ENCOUNTER — Inpatient Hospital Stay (HOSPITAL_COMMUNITY): Payer: BLUE CROSS/BLUE SHIELD

## 2018-06-26 ENCOUNTER — Other Ambulatory Visit: Payer: Self-pay

## 2018-06-26 DIAGNOSIS — Z79899 Other long term (current) drug therapy: Secondary | ICD-10-CM | POA: Diagnosis not present

## 2018-06-26 DIAGNOSIS — Z880 Allergy status to penicillin: Secondary | ICD-10-CM | POA: Diagnosis not present

## 2018-06-26 DIAGNOSIS — Z7984 Long term (current) use of oral hypoglycemic drugs: Secondary | ICD-10-CM

## 2018-06-26 DIAGNOSIS — E039 Hypothyroidism, unspecified: Secondary | ICD-10-CM | POA: Diagnosis present

## 2018-06-26 DIAGNOSIS — I10 Essential (primary) hypertension: Secondary | ICD-10-CM | POA: Diagnosis present

## 2018-06-26 DIAGNOSIS — D62 Acute posthemorrhagic anemia: Secondary | ICD-10-CM | POA: Diagnosis not present

## 2018-06-26 DIAGNOSIS — J45909 Unspecified asthma, uncomplicated: Secondary | ICD-10-CM | POA: Diagnosis present

## 2018-06-26 DIAGNOSIS — K219 Gastro-esophageal reflux disease without esophagitis: Secondary | ICD-10-CM | POA: Diagnosis present

## 2018-06-26 DIAGNOSIS — E785 Hyperlipidemia, unspecified: Secondary | ICD-10-CM | POA: Diagnosis present

## 2018-06-26 DIAGNOSIS — Z9181 History of falling: Secondary | ICD-10-CM | POA: Diagnosis not present

## 2018-06-26 DIAGNOSIS — Z83511 Family history of glaucoma: Secondary | ICD-10-CM | POA: Diagnosis not present

## 2018-06-26 DIAGNOSIS — Z79890 Hormone replacement therapy (postmenopausal): Secondary | ICD-10-CM

## 2018-06-26 DIAGNOSIS — M1612 Unilateral primary osteoarthritis, left hip: Secondary | ICD-10-CM | POA: Diagnosis present

## 2018-06-26 DIAGNOSIS — E119 Type 2 diabetes mellitus without complications: Secondary | ICD-10-CM | POA: Diagnosis present

## 2018-06-26 DIAGNOSIS — Z7982 Long term (current) use of aspirin: Secondary | ICD-10-CM | POA: Diagnosis not present

## 2018-06-26 DIAGNOSIS — Z419 Encounter for procedure for purposes other than remedying health state, unspecified: Secondary | ICD-10-CM

## 2018-06-26 DIAGNOSIS — Z09 Encounter for follow-up examination after completed treatment for conditions other than malignant neoplasm: Secondary | ICD-10-CM

## 2018-06-26 HISTORY — PX: TOTAL HIP ARTHROPLASTY: SHX124

## 2018-06-26 LAB — GLUCOSE, CAPILLARY
Glucose-Capillary: 138 mg/dL — ABNORMAL HIGH (ref 70–99)
Glucose-Capillary: 180 mg/dL — ABNORMAL HIGH (ref 70–99)
Glucose-Capillary: 129 mg/dL — ABNORMAL HIGH (ref 70–99)
Glucose-Capillary: 174 mg/dL — ABNORMAL HIGH (ref 70–99)

## 2018-06-26 LAB — POCT I-STAT 4, (NA,K, GLUC, HGB,HCT)
Glucose, Bld: 200 mg/dL — ABNORMAL HIGH (ref 70–99)
HCT: 24 % — ABNORMAL LOW (ref 36.0–46.0)
Hemoglobin: 8.2 g/dL — ABNORMAL LOW (ref 12.0–15.0)
POTASSIUM: 4 mmol/L (ref 3.5–5.1)
SODIUM: 139 mmol/L (ref 135–145)

## 2018-06-26 SURGERY — ARTHROPLASTY, HIP, TOTAL, ANTERIOR APPROACH
Anesthesia: Spinal | Site: Hip | Laterality: Left

## 2018-06-26 MED ORDER — SODIUM CHLORIDE 0.9 % IV BOLUS
1000.0000 mL | Freq: Once | INTRAVENOUS | Status: AC
  Administered 2018-06-26: 1000 mL via INTRAVENOUS

## 2018-06-26 MED ORDER — EPHEDRINE 5 MG/ML INJ
INTRAVENOUS | Status: AC
  Filled 2018-06-26: qty 10

## 2018-06-26 MED ORDER — PROMETHAZINE HCL 25 MG/ML IJ SOLN: 6.2500 mg | INTRAMUSCULAR | Status: DC | PRN

## 2018-06-26 MED ORDER — FENTANYL CITRATE (PF) 100 MCG/2ML IJ SOLN
INTRAMUSCULAR | Status: AC
  Filled 2018-06-26: qty 2

## 2018-06-26 MED ORDER — SODIUM CHLORIDE (PF) 0.9 % IJ SOLN
INTRAMUSCULAR | Status: AC
  Filled 2018-06-26: qty 50

## 2018-06-26 MED ORDER — KETOROLAC TROMETHAMINE 30 MG/ML IJ SOLN
INTRAMUSCULAR | Status: DC | PRN
  Administered 2018-06-26: 30 mg

## 2018-06-26 MED ORDER — LEVOTHYROXINE SODIUM 75 MCG PO TABS
75.0000 ug | ORAL_TABLET | Freq: Every day | ORAL | Status: DC
  Administered 2018-06-27 – 2018-06-28 (×2): 75 ug via ORAL
  Filled 2018-06-26 (×2): qty 1

## 2018-06-26 MED ORDER — PROPOFOL 10 MG/ML IV BOLUS
INTRAVENOUS | Status: AC
  Filled 2018-06-26: qty 60

## 2018-06-26 MED ORDER — HYDROCODONE-ACETAMINOPHEN 5-325 MG PO TABS
ORAL_TABLET | ORAL | Status: AC
  Filled 2018-06-26: qty 2

## 2018-06-26 MED ORDER — DEXAMETHASONE SODIUM PHOSPHATE 10 MG/ML IJ SOLN
10.0000 mg | Freq: Once | INTRAMUSCULAR | Status: AC
  Administered 2018-06-27: 10 mg via INTRAVENOUS
  Filled 2018-06-26: qty 1

## 2018-06-26 MED ORDER — HYDROMORPHONE HCL 1 MG/ML IJ SOLN: 0.2500 mg | INTRAMUSCULAR | Status: DC | PRN

## 2018-06-26 MED ORDER — CHLORHEXIDINE GLUCONATE 4 % EX LIQD: 60.0000 mL | Freq: Once | CUTANEOUS | Status: DC

## 2018-06-26 MED ORDER — PROPOFOL 500 MG/50ML IV EMUL
INTRAVENOUS | Status: DC | PRN
  Administered 2018-06-26: 75 ug/kg/min via INTRAVENOUS

## 2018-06-26 MED ORDER — VITAMIN D 25 MCG (1000 UNIT) PO TABS
2000.0000 [IU] | ORAL_TABLET | Freq: Every day | ORAL | Status: DC
  Administered 2018-06-26 – 2018-06-28 (×3): 2000 [IU] via ORAL
  Filled 2018-06-26 (×3): qty 2

## 2018-06-26 MED ORDER — MIDAZOLAM HCL 2 MG/2ML IJ SOLN
INTRAMUSCULAR | Status: AC
  Filled 2018-06-26: qty 2

## 2018-06-26 MED ORDER — LACTATED RINGERS IV SOLN
INTRAVENOUS | Status: DC
  Administered 2018-06-26 (×2): via INTRAVENOUS
  Administered 2018-06-26 (×2): 1000 mL via INTRAVENOUS

## 2018-06-26 MED ORDER — PROPOFOL 10 MG/ML IV BOLUS
INTRAVENOUS | Status: AC
  Filled 2018-06-26: qty 20

## 2018-06-26 MED ORDER — POVIDONE-IODINE 10 % EX SWAB: 2.0000 "application " | Freq: Once | CUTANEOUS | Status: DC

## 2018-06-26 MED ORDER — METFORMIN HCL 500 MG PO TABS
1000.0000 mg | ORAL_TABLET | Freq: Every day | ORAL | Status: DC
  Administered 2018-06-27 – 2018-06-28 (×2): 1000 mg via ORAL
  Filled 2018-06-26 (×2): qty 2

## 2018-06-26 MED ORDER — BUPIVACAINE-EPINEPHRINE 0.25% -1:200000 IJ SOLN
INTRAMUSCULAR | Status: DC | PRN
  Administered 2018-06-26: 30 mL

## 2018-06-26 MED ORDER — ALBUMIN HUMAN 5 % IV SOLN
INTRAVENOUS | Status: DC | PRN
  Administered 2018-06-26 (×2): via INTRAVENOUS

## 2018-06-26 MED ORDER — FENTANYL CITRATE (PF) 100 MCG/2ML IJ SOLN
INTRAMUSCULAR | Status: DC | PRN
  Administered 2018-06-26: 100 ug via INTRAVENOUS

## 2018-06-26 MED ORDER — ALBUTEROL SULFATE (2.5 MG/3ML) 0.083% IN NEBU
2.5000 mg | INHALATION_SOLUTION | Freq: Four times a day (QID) | RESPIRATORY_TRACT | Status: DC | PRN
  Filled 2018-06-26: qty 3

## 2018-06-26 MED ORDER — HYDROCODONE-ACETAMINOPHEN 5-325 MG PO TABS
1.0000 | ORAL_TABLET | ORAL | Status: DC | PRN
  Administered 2018-06-26: 2 via ORAL
  Administered 2018-06-27 – 2018-06-28 (×5): 1 via ORAL
  Administered 2018-06-28: 2 via ORAL
  Filled 2018-06-26: qty 1
  Filled 2018-06-26: qty 2
  Filled 2018-06-26 (×4): qty 1

## 2018-06-26 MED ORDER — METOCLOPRAMIDE HCL 5 MG/ML IJ SOLN: 5.0000 mg | Freq: Three times a day (TID) | INTRAMUSCULAR | Status: DC | PRN

## 2018-06-26 MED ORDER — SODIUM CHLORIDE 0.9 % IV SOLN: INTRAVENOUS | Status: DC

## 2018-06-26 MED ORDER — LORATADINE 10 MG PO TABS
10.0000 mg | ORAL_TABLET | Freq: Every day | ORAL | Status: DC
  Administered 2018-06-27 – 2018-06-28 (×2): 10 mg via ORAL
  Filled 2018-06-26 (×2): qty 1

## 2018-06-26 MED ORDER — LACTATED RINGERS IV SOLN: INTRAVENOUS | Status: DC

## 2018-06-26 MED ORDER — METOCLOPRAMIDE HCL 5 MG PO TABS: 5.0000 mg | ORAL_TABLET | Freq: Three times a day (TID) | ORAL | Status: DC | PRN

## 2018-06-26 MED ORDER — METHOCARBAMOL 500 MG IVPB - SIMPLE MED
500.0000 mg | Freq: Four times a day (QID) | INTRAVENOUS | Status: DC | PRN
  Administered 2018-06-26: 500 mg via INTRAVENOUS
  Filled 2018-06-26: qty 50

## 2018-06-26 MED ORDER — HYDROCODONE-ACETAMINOPHEN 7.5-325 MG PO TABS: 1.0000 | ORAL_TABLET | ORAL | Status: DC | PRN

## 2018-06-26 MED ORDER — VANCOMYCIN HCL IN DEXTROSE 1-5 GM/200ML-% IV SOLN
INTRAVENOUS | Status: AC
  Filled 2018-06-26: qty 200

## 2018-06-26 MED ORDER — PHENYLEPHRINE HCL-NACL 10-0.9 MG/250ML-% IV SOLN
0.0000 ug/min | INTRAVENOUS | Status: DC
  Administered 2018-06-26: 16.667 ug/min via INTRAVENOUS

## 2018-06-26 MED ORDER — DIPHENHYDRAMINE HCL 12.5 MG/5ML PO ELIX: 12.5000 mg | ORAL_SOLUTION | ORAL | Status: DC | PRN

## 2018-06-26 MED ORDER — SENNA 8.6 MG PO TABS
1.0000 | ORAL_TABLET | Freq: Two times a day (BID) | ORAL | Status: DC
  Administered 2018-06-26 – 2018-06-28 (×4): 8.6 mg via ORAL
  Filled 2018-06-26 (×4): qty 1

## 2018-06-26 MED ORDER — FLUTICASONE PROPIONATE 50 MCG/ACT NA SUSP: 1.0000 | Freq: Every day | NASAL | Status: DC | PRN

## 2018-06-26 MED ORDER — ISOPROPYL ALCOHOL 70 % SOLN
Status: AC
  Filled 2018-06-26: qty 480

## 2018-06-26 MED ORDER — ONDANSETRON HCL 4 MG PO TABS: 4.0000 mg | ORAL_TABLET | Freq: Four times a day (QID) | ORAL | Status: DC | PRN

## 2018-06-26 MED ORDER — BUPIVACAINE IN DEXTROSE 0.75-8.25 % IT SOLN
INTRATHECAL | Status: DC | PRN
  Administered 2018-06-26: 1.8 mL via INTRATHECAL

## 2018-06-26 MED ORDER — ALBUMIN HUMAN 5 % IV SOLN
INTRAVENOUS | Status: AC
  Filled 2018-06-26: qty 500

## 2018-06-26 MED ORDER — PHENYLEPHRINE 40 MCG/ML (10ML) SYRINGE FOR IV PUSH (FOR BLOOD PRESSURE SUPPORT)
PREFILLED_SYRINGE | INTRAVENOUS | Status: AC
  Filled 2018-06-26: qty 20

## 2018-06-26 MED ORDER — PHENOL 1.4 % MT LIQD: 1.0000 | OROMUCOSAL | Status: DC | PRN

## 2018-06-26 MED ORDER — PHENYLEPHRINE 40 MCG/ML (10ML) SYRINGE FOR IV PUSH (FOR BLOOD PRESSURE SUPPORT)
PREFILLED_SYRINGE | INTRAVENOUS | Status: AC
  Filled 2018-06-26: qty 10

## 2018-06-26 MED ORDER — FENTANYL CITRATE (PF) 100 MCG/2ML IJ SOLN: 25.0000 ug | INTRAMUSCULAR | Status: DC | PRN

## 2018-06-26 MED ORDER — METHOCARBAMOL 500 MG PO TABS
500.0000 mg | ORAL_TABLET | Freq: Four times a day (QID) | ORAL | Status: DC | PRN
  Administered 2018-06-27 – 2018-06-28 (×4): 500 mg via ORAL
  Filled 2018-06-26 (×4): qty 1

## 2018-06-26 MED ORDER — CLINDAMYCIN PHOSPHATE 900 MG/50ML IV SOLN
900.0000 mg | INTRAVENOUS | Status: AC
  Administered 2018-06-26: 900 mg via INTRAVENOUS
  Filled 2018-06-26: qty 50

## 2018-06-26 MED ORDER — EPHEDRINE SULFATE 50 MG/ML IJ SOLN
INTRAMUSCULAR | Status: DC | PRN
  Administered 2018-06-26 (×2): 10 mg via INTRAVENOUS

## 2018-06-26 MED ORDER — MEPERIDINE HCL 50 MG/ML IJ SOLN: 6.2500 mg | INTRAMUSCULAR | Status: DC | PRN

## 2018-06-26 MED ORDER — ALUM & MAG HYDROXIDE-SIMETH 200-200-20 MG/5ML PO SUSP: 30.0000 mL | ORAL | Status: DC | PRN

## 2018-06-26 MED ORDER — BUPIVACAINE-EPINEPHRINE (PF) 0.25% -1:200000 IJ SOLN
INTRAMUSCULAR | Status: AC
  Filled 2018-06-26: qty 30

## 2018-06-26 MED ORDER — ESCITALOPRAM OXALATE 10 MG PO TABS
10.0000 mg | ORAL_TABLET | Freq: Every day | ORAL | Status: DC
  Administered 2018-06-27 – 2018-06-28 (×2): 10 mg via ORAL
  Filled 2018-06-26 (×3): qty 1

## 2018-06-26 MED ORDER — PHENYLEPHRINE HCL-NACL 10-0.9 MG/250ML-% IV SOLN
INTRAVENOUS | Status: AC
  Filled 2018-06-26: qty 250

## 2018-06-26 MED ORDER — SODIUM CHLORIDE 0.9 % IV SOLN
INTRAVENOUS | Status: DC | PRN
  Administered 2018-06-26: 30 ug/min via INTRAVENOUS

## 2018-06-26 MED ORDER — INSULIN ASPART 100 UNIT/ML ~~LOC~~ SOLN
0.0000 [IU] | Freq: Every day | SUBCUTANEOUS | Status: DC
  Administered 2018-06-27: 2 [IU] via SUBCUTANEOUS

## 2018-06-26 MED ORDER — KETOROLAC TROMETHAMINE 15 MG/ML IJ SOLN
15.0000 mg | Freq: Four times a day (QID) | INTRAMUSCULAR | Status: AC
  Administered 2018-06-26 – 2018-06-27 (×4): 15 mg via INTRAVENOUS
  Filled 2018-06-26 (×4): qty 1

## 2018-06-26 MED ORDER — MIDAZOLAM HCL 5 MG/5ML IJ SOLN
INTRAMUSCULAR | Status: DC | PRN
  Administered 2018-06-26: 2 mg via INTRAVENOUS

## 2018-06-26 MED ORDER — ACETAMINOPHEN 325 MG PO TABS: 325.0000 mg | ORAL_TABLET | Freq: Four times a day (QID) | ORAL | Status: DC | PRN

## 2018-06-26 MED ORDER — SODIUM CHLORIDE (PF) 0.9 % IJ SOLN
INTRAMUSCULAR | Status: DC | PRN
  Administered 2018-06-26: 30 mL

## 2018-06-26 MED ORDER — ISOPROPYL ALCOHOL 70 % SOLN
Status: DC | PRN
  Administered 2018-06-26: 1 via TOPICAL

## 2018-06-26 MED ORDER — MORPHINE SULFATE (PF) 2 MG/ML IV SOLN: 0.5000 mg | INTRAVENOUS | Status: DC | PRN

## 2018-06-26 MED ORDER — ATORVASTATIN CALCIUM 20 MG PO TABS
20.0000 mg | ORAL_TABLET | Freq: Every day | ORAL | Status: DC
  Administered 2018-06-26 – 2018-06-28 (×3): 20 mg via ORAL
  Filled 2018-06-26 (×3): qty 1

## 2018-06-26 MED ORDER — POLYETHYLENE GLYCOL 3350 17 G PO PACK: 17.0000 g | PACK | Freq: Every day | ORAL | Status: DC | PRN

## 2018-06-26 MED ORDER — ACETAMINOPHEN 10 MG/ML IV SOLN
1000.0000 mg | INTRAVENOUS | Status: AC
  Administered 2018-06-26: 1000 mg via INTRAVENOUS
  Filled 2018-06-26: qty 100

## 2018-06-26 MED ORDER — ALBUMIN HUMAN 5 % IV SOLN
INTRAVENOUS | Status: AC
  Filled 2018-06-26: qty 250

## 2018-06-26 MED ORDER — VANCOMYCIN HCL IN DEXTROSE 1-5 GM/200ML-% IV SOLN
1000.0000 mg | Freq: Two times a day (BID) | INTRAVENOUS | Status: AC
  Administered 2018-06-25: 1000 mg via INTRAVENOUS
  Filled 2018-06-26 (×2): qty 200

## 2018-06-26 MED ORDER — ALBUMIN HUMAN 5 % IV SOLN
12.5000 g | Freq: Once | INTRAVENOUS | Status: AC
  Administered 2018-06-26: 12.5 g via INTRAVENOUS

## 2018-06-26 MED ORDER — PHENYLEPHRINE HCL 10 MG/ML IJ SOLN
INTRAMUSCULAR | Status: DC | PRN
  Administered 2018-06-26: 160 ug via INTRAVENOUS
  Administered 2018-06-26: 80 ug via INTRAVENOUS
  Administered 2018-06-26 (×2): 160 ug via INTRAVENOUS
  Administered 2018-06-26: 120 ug via INTRAVENOUS
  Administered 2018-06-26: 160 ug via INTRAVENOUS
  Administered 2018-06-26 (×2): 120 ug via INTRAVENOUS

## 2018-06-26 MED ORDER — VANCOMYCIN HCL 1000 MG IV SOLR
INTRAVENOUS | Status: DC | PRN
  Administered 2018-06-26: 1000 mg via INTRAVENOUS

## 2018-06-26 MED ORDER — ONDANSETRON HCL 4 MG/2ML IJ SOLN: 4.0000 mg | Freq: Four times a day (QID) | INTRAMUSCULAR | Status: DC | PRN

## 2018-06-26 MED ORDER — TRANEXAMIC ACID-NACL 1000-0.7 MG/100ML-% IV SOLN
1000.0000 mg | INTRAVENOUS | Status: AC
  Administered 2018-06-26: 1000 mg via INTRAVENOUS
  Filled 2018-06-26: qty 100

## 2018-06-26 MED ORDER — DOCUSATE SODIUM 100 MG PO CAPS
100.0000 mg | ORAL_CAPSULE | Freq: Two times a day (BID) | ORAL | Status: DC
  Administered 2018-06-26 – 2018-06-28 (×4): 100 mg via ORAL
  Filled 2018-06-26 (×4): qty 1

## 2018-06-26 MED ORDER — ASPIRIN 81 MG PO CHEW
81.0000 mg | CHEWABLE_TABLET | Freq: Two times a day (BID) | ORAL | Status: DC
  Administered 2018-06-26 – 2018-06-28 (×4): 81 mg via ORAL
  Filled 2018-06-26 (×4): qty 1

## 2018-06-26 MED ORDER — SODIUM CHLORIDE 0.9 % IV SOLN
INTRAVENOUS | Status: DC
  Administered 2018-06-26 – 2018-06-27 (×3): via INTRAVENOUS

## 2018-06-26 MED ORDER — MENTHOL 3 MG MT LOZG: 1.0000 | LOZENGE | OROMUCOSAL | Status: DC | PRN

## 2018-06-26 MED ORDER — KETOROLAC TROMETHAMINE 30 MG/ML IJ SOLN
INTRAMUSCULAR | Status: AC
  Filled 2018-06-26: qty 1

## 2018-06-26 MED ORDER — INSULIN ASPART 100 UNIT/ML ~~LOC~~ SOLN
0.0000 [IU] | Freq: Three times a day (TID) | SUBCUTANEOUS | Status: DC
  Administered 2018-06-27: 2 [IU] via SUBCUTANEOUS
  Administered 2018-06-27 (×2): 3 [IU] via SUBCUTANEOUS
  Administered 2018-06-28: 2 [IU] via SUBCUTANEOUS
  Administered 2018-06-28: 3 [IU] via SUBCUTANEOUS

## 2018-06-26 MED ORDER — ONDANSETRON HCL 4 MG/2ML IJ SOLN
INTRAMUSCULAR | Status: DC | PRN
  Administered 2018-06-26: 4 mg via INTRAVENOUS

## 2018-06-26 MED ORDER — IRBESARTAN 150 MG PO TABS
150.0000 mg | ORAL_TABLET | Freq: Every day | ORAL | Status: DC
  Filled 2018-06-26: qty 1

## 2018-06-26 MED ORDER — WATER FOR IRRIGATION, STERILE IR SOLN
Status: DC | PRN
  Administered 2018-06-26: 2000 mL

## 2018-06-26 MED ORDER — METHOCARBAMOL 500 MG IVPB - SIMPLE MED
INTRAVENOUS | Status: AC
  Filled 2018-06-26: qty 50

## 2018-06-26 MED ORDER — SODIUM CHLORIDE 0.9 % IR SOLN
Status: DC | PRN
  Administered 2018-06-26 (×2): 1000 mL

## 2018-06-26 SURGICAL SUPPLY — 55 items
BAG DECANTER FOR FLEXI CONT (MISCELLANEOUS) IMPLANT
BAG ZIPLOCK 12X15 (MISCELLANEOUS) IMPLANT
BLADE SURG SZ10 CARB STEEL (BLADE) ×6 IMPLANT
CHLORAPREP W/TINT 26ML (MISCELLANEOUS) ×3 IMPLANT
CLOTH BEACON ORANGE TIMEOUT ST (SAFETY) ×3 IMPLANT
COVER PERINEAL POST (MISCELLANEOUS) ×3 IMPLANT
COVER SURGICAL LIGHT HANDLE (MISCELLANEOUS) ×3 IMPLANT
COVER WAND RF STERILE (DRAPES) ×3 IMPLANT
CUP ACETAB 50MM (Hips) ×3 IMPLANT
DECANTER SPIKE VIAL GLASS SM (MISCELLANEOUS) ×3 IMPLANT
DERMABOND ADVANCED (GAUZE/BANDAGES/DRESSINGS) ×2
DERMABOND ADVANCED .7 DNX12 (GAUZE/BANDAGES/DRESSINGS) ×1 IMPLANT
DRAPE IMP U-DRAPE 54X76 (DRAPES) ×3 IMPLANT
DRAPE SHEET LG 3/4 BI-LAMINATE (DRAPES) ×9 IMPLANT
DRAPE STERI IOBAN 125X83 (DRAPES) ×3 IMPLANT
DRAPE U-SHAPE 47X51 STRL (DRAPES) ×6 IMPLANT
DRESSING AQUACEL AG SP 3.5X10 (GAUZE/BANDAGES/DRESSINGS) ×1 IMPLANT
DRSG AQUACEL AG ADV 3.5X10 (GAUZE/BANDAGES/DRESSINGS) ×3 IMPLANT
DRSG AQUACEL AG SP 3.5X10 (GAUZE/BANDAGES/DRESSINGS) ×3
ELECT PENCIL ROCKER SW 15FT (MISCELLANEOUS) ×3 IMPLANT
ELECT REM PT RETURN 15FT ADLT (MISCELLANEOUS) ×3 IMPLANT
GAUZE SPONGE 4X4 12PLY STRL (GAUZE/BANDAGES/DRESSINGS) ×3 IMPLANT
GLOVE BIO SURGEON STRL SZ8.5 (GLOVE) ×6 IMPLANT
GLOVE BIOGEL PI IND STRL 8.5 (GLOVE) ×1 IMPLANT
GLOVE BIOGEL PI INDICATOR 8.5 (GLOVE) ×2
GOWN SPEC L3 XXLG W/TWL (GOWN DISPOSABLE) ×3 IMPLANT
HANDPIECE INTERPULSE COAX TIP (DISPOSABLE) ×2
HEAD FEMORAL 32 CERAMIC (Hips) ×3 IMPLANT
HOLDER FOLEY CATH W/STRAP (MISCELLANEOUS) ×3 IMPLANT
HOOD PEEL AWAY FLYTE STAYCOOL (MISCELLANEOUS) ×12 IMPLANT
LINER ACET PNNCL PLUS4 NEUTRAL (Hips) ×1 IMPLANT
MARKER SKIN DUAL TIP RULER LAB (MISCELLANEOUS) ×3 IMPLANT
NEEDLE SPNL 18GX3.5 QUINCKE PK (NEEDLE) ×3 IMPLANT
PACK ANTERIOR HIP CUSTOM (KITS) ×3 IMPLANT
PINNACLE PLUS 4 NEUTRAL (Hips) ×3 IMPLANT
SAW OSC TIP CART 19.5X105X1.3 (SAW) ×3 IMPLANT
SCREW 6.5MMX25MM (Screw) ×6 IMPLANT
SCREW 6.5MMX30MM (Screw) ×3 IMPLANT
SEALER BIPOLAR AQUA 6.0 (INSTRUMENTS) ×3 IMPLANT
SET HNDPC FAN SPRY TIP SCT (DISPOSABLE) ×1 IMPLANT
STEM TRI LOC BPS GRIP SZ0 OFFS ×1 IMPLANT
SUT ETHIBOND NAB CT1 #1 30IN (SUTURE) ×6 IMPLANT
SUT MNCRL AB 3-0 PS2 18 (SUTURE) ×3 IMPLANT
SUT MNCRL AB 4-0 PS2 18 (SUTURE) ×3 IMPLANT
SUT MON AB 2-0 CT1 36 (SUTURE) ×6 IMPLANT
SUT STRATAFIX PDO 1 14 VIOLET (SUTURE) ×2
SUT STRATFX PDO 1 14 VIOLET (SUTURE) ×1
SUT VIC AB 2-0 CT1 27 (SUTURE) ×2
SUT VIC AB 2-0 CT1 TAPERPNT 27 (SUTURE) ×1 IMPLANT
SUTURE STRATFX PDO 1 14 VIOLET (SUTURE) ×1 IMPLANT
SYR 50ML LL SCALE MARK (SYRINGE) ×3 IMPLANT
TRAY FOLEY MTR SLVR 14FR STAT (SET/KITS/TRAYS/PACK) ×3 IMPLANT
TRI LOC BPS W/GRIP SZ0 OFFS HI ×3 IMPLANT
WATER STERILE IRR 1000ML POUR (IV SOLUTION) ×3 IMPLANT
YANKAUER SUCT BULB TIP 10FT TU (MISCELLANEOUS) ×3 IMPLANT

## 2018-06-26 NOTE — Anesthesia Procedure Notes (Signed)
Spinal  Start time: 06/26/2018 7:52 AM End time: 06/26/2018 7:54 AM Staffing Anesthesiologist: Effie Berkshire, MD Performed: anesthesiologist  Preanesthetic Checklist Completed: patient identified, site marked, surgical consent, pre-op evaluation, timeout performed, IV checked, risks and benefits discussed and monitors and equipment checked Spinal Block Patient position: sitting Prep: site prepped and draped and DuraPrep Location: L3-4 Injection technique: single-shot Needle Needle type: Pencan  Needle gauge: 24 G Needle length: 10 cm Needle insertion depth: 10 cm Additional Notes Patient tolerated well. No immediate complications.

## 2018-06-26 NOTE — Progress Notes (Addendum)
PT Cancellation Note  Patient Details Name: Caitlyn Anderson MRN: [id number] DOB: 04/19/1961   Cancelled Treatment:    Reason Eval/Treat Not Completed: Patient not medically ready - Per RN, pt is hypotensive, and is dizzy with raising HOB. RN defers PT. Pt with 900 mL blood loss during surgery, potentially moving to telemetry floor. PT to see tomorrow am.  Julien Girt, PT Acute Rehabilitation Services Pager [phone]  Office [phone]   Roxine Caddy D Elonda Husky 06/26/2018, 5:16 PM

## 2018-06-26 NOTE — Progress Notes (Signed)
On arrival to PACU blood pressure low, color very pale, pt clammy, Dr. Smith Robert, anesthesiologist called, neo drip started, albumin ordered and given

## 2018-06-26 NOTE — Anesthesia Postprocedure Evaluation (Signed)
Anesthesia Post Note  Patient: Caitlyn Anderson  Procedure(s) Performed: TOTAL HIP ARTHROPLASTY ANTERIOR APPROACH (Left Hip)     Patient location during evaluation: PACU Anesthesia Type: Spinal Level of consciousness: oriented and awake and alert Pain management: pain level controlled Vital Signs Assessment: post-procedure vital signs reviewed and stable Respiratory status: spontaneous breathing, respiratory function stable and patient connected to nasal cannula oxygen Cardiovascular status: blood pressure returned to baseline and stable Postop Assessment: no headache, no backache and no apparent nausea or vomiting Anesthetic complications: no Comments: BP stable after volume resuscitation. Will monitor on floor. CBC in AM, anticipate hgb in 7-8 range.     Last Vitals:  Vitals:   06/26/18 1505 06/26/18 1515  BP: 101/66 98/64  Pulse: 78 78  Resp: 11 17  Temp:    SpO2: 100% 100%    Last Pain:  Vitals:   06/26/18 1500  TempSrc:   PainSc: 3                  Effie Berkshire

## 2018-06-26 NOTE — Op Note (Signed)
OPERATIVE REPORT  SURGEON: Rod Can, MD   ASSISTANT: Nehemiah Massed, PA-C.  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   PROCEDURE: Left total hip arthroplasty, anterior approach.   IMPLANTS: DePuy Tri Lock stem, size 0, hi offset. DePuy Pinnacle Cup, size 50 mm. DePuy Altrx liner, size 32 by 50 mm, +4 neutral. DePuy Biolox ceramic head ball, size 32 + 1 mm. 6.5 mm cancellous bone screw x3.  ANESTHESIA:  MAC and Spinal  ESTIMATED BLOOD LOSS:-900 mL    ANTIBIOTICS: 1 g vancomycin.  DRAINS: None.  COMPLICATIONS: None.   CONDITION: PACU - hemodynamically stable.   BRIEF CLINICAL NOTE: Caitlyn Anderson is a 57 y.o. female with a long-standing history of Left hip arthritis. After failing conservative management, the patient was indicated for total hip arthroplasty. The risks, benefits, and alternatives to the procedure were explained, and the patient elected to proceed.  PROCEDURE IN DETAIL: Surgical site was marked by myself in the pre-op holding area. Once inside the operating room, spinal anesthesia was obtained, and a foley catheter was inserted. The patient was then positioned on the Hana table. All bony prominences were well padded. The hip was prepped and draped in the normal sterile surgical fashion. A time-out was called verifying side and site of surgery. The patient received IV antibiotics within 60 minutes of beginning the procedure.  The direct anterior approach to the hip was performed through the Hueter interval. Lateral femoral circumflex vessels were treated with the Auqumantys. The anterior capsule was exposed and an inverted T capsulotomy was made.The femoral neck cut was made to the level of the templated cut. A corkscrew was placed into the head and the head was removed. The femoral head was found to have eburnated bone. The head was passed to the back table  and was measured.  Acetabular exposure was achieved, and the pulvinar and labrum were excised. The acetabulum was egg shaped with a deficient anterior wall. I reamed medial to the floor of the pulvinar, taking care to keep the hip center low and posterior. Sequential reaming of the acetabulum was then performed up to a size 49 mm reamer. There was bleeding from the cancellous bone. A 50 mm cup was then opened and impacted into place at approximately 40 degrees of abduction and 20 degrees of anteversion. I chose to augment the already acceptable press fit fixation with 6.5 mm cancellous bone screws x3. The final polyethylene liner was impacted into place and acetabular osteophytes were removed.   I then gained femoral exposure taking care to protect the abductors and greater trochanter. This was performed using standard external rotation, extension, and adduction. The capsule was peeled off the inner aspect of the greater trochanter, taking care to preserve the short external rotators. A cookie cutter was used to enter the femoral canal, and then the femoral canal finder was placed. Sequential broaching was performed up to a size 0. Calcar planer was used on the femoral neck remnant. I placed a hi offset neck and a trial head ball. The hip was reduced. Leg lengths and offset were checked fluoroscopically. The hip was dislocated and trial components were removed. The final implants were placed, and the hip was reduced.  The femoral component did sit prouder than the broach. Fluoroscopy was used to confirm component position and leg lengths. At 90 degrees of external rotation and full extension, the hip was stable to an anterior directed force.  The wound was copiously irrigated with normal saline using pulse lavage. Marcaine solution was injected into the periarticular soft tissue. The wound was closed in layers using #1 Vicryl and V-Loc for the fascia, 2-0 Vicryl for the subcutaneous fat, 2-0  Monocryl for the deep dermal layer, 3-0 running Monocryl subcuticular stitch, and Dermabond for the skin. Once the glue was fully dried, an Aquacell Ag dressing was applied. The patient was transported to the recovery room in stable condition. Sponge, needle, and instrument counts were correct at the end of the case x2. The patient tolerated the procedure well and there were no known complications.  Please note that a surgical assistant was a medical necessity for this procedure to perform it in a safe and expeditious manner. Assistant was necessary to provide appropriate retraction of vital neurovascular structures, to prevent femoral fracture, and to allow for anatomic placement of the prosthesis.

## 2018-06-26 NOTE — Progress Notes (Signed)
Dr. Smith Robert updated regarding blood pressure; OK for pt to go to med surg floor

## 2018-06-26 NOTE — Progress Notes (Signed)
Pt 's blood pressure continues to be low, neo drip titrated as needed

## 2018-06-26 NOTE — Progress Notes (Signed)
Questioned PACU RN Magda Paganini and Wells Guiles in regards to transferring this patient due to hypotension after 932mL blood, neo drip use and reuse, several doses of albumin and still blood pressure is fluctuating. Patient is still hypotensive when calling report to bring patient to the floor.     Patient brought to the floor by Garlan Fillers. Blood pressure is 97/50 and heart rate 75.Patient dizzy upon sitting up head of the bed.  Paged Swinteck, MD. Swinteck stated that patient is supposed be on telemetry specifically due to her 937mL blood loss. 1 liter bolus ordered and to recheck BP. Swinteck stated that if systolic blood pressure is 90 or below to transfer to telemetry, as previously ordered.

## 2018-06-26 NOTE — Progress Notes (Signed)
BP remaining 90's / 60's, Heart rate 70's; neo drip off

## 2018-06-26 NOTE — Transfer of Care (Signed)
Immediate Anesthesia Transfer of Care Note  Patient: Caitlyn Anderson  Procedure(s) Performed: TOTAL HIP ARTHROPLASTY ANTERIOR APPROACH (Left Hip)  Patient Location: PACU  Anesthesia Type:MAC and Spinal  Level of Consciousness: awake, alert , oriented and patient cooperative  Airway & Oxygen Therapy: Patient Spontanous Breathing and Patient connected to face mask oxygen  Post-op Assessment: Report given to RN and Post -op Vital signs reviewed and stable  Post vital signs: Reviewed and stable  Last Vitals:  Vitals Value Taken Time  BP 59/46 06/26/2018 10:58 AM  Temp    Pulse 67 06/26/2018 10:59 AM  Resp 19 06/26/2018 10:59 AM  SpO2 100 % 06/26/2018 10:59 AM  Vitals shown include unvalidated device data.  Last Pain:  Vitals:   06/26/18 0625  TempSrc:   PainSc: 0-No pain      Patients Stated Pain Goal: 4 (56/25/63 8937)  Complications: No apparent anesthesia complications

## 2018-06-26 NOTE — Progress Notes (Signed)
Made Dr.Swinteck aware of new blood pressure, MD verbalizes to discontinue telemetry order but if patient does get hypotensive again or declines to order cardiac monitoring and transfer to telemetry.

## 2018-06-26 NOTE — Discharge Instructions (Signed)
°Dr. Shaniya Tashiro °Joint Replacement Specialist °[facility] °[address] °[place], [place] [address] °[phone] ° ° °TOTAL HIP REPLACEMENT POSTOPERATIVE DIRECTIONS ° ° ° °Hip Rehabilitation, Guidelines Following Surgery  ° °WEIGHT BEARING °Weight bearing as tolerated with assist device (walker, cane, etc) as directed, use it as long as suggested by your surgeon or therapist, typically at least 4-6 weeks. ° °The results of a hip operation are greatly improved after range of motion and muscle strengthening exercises. Follow all safety measures which are given to protect your hip. If any of these exercises cause increased pain or swelling in your joint, decrease the amount until you are comfortable again. Then slowly increase the exercises. Call your caregiver if you have problems or questions.  ° °HOME CARE INSTRUCTIONS  °Most of the following instructions are designed to prevent the dislocation of your new hip.  °Remove items at home which could result in a fall. This includes throw rugs or furniture in walking pathways.  °Continue medications as instructed at time of discharge. °· You may have some home medications which will be placed on hold until you complete the course of blood thinner medication. °· You may start showering once you are discharged home. Do not remove your dressing. °Do not put on socks or shoes without following the instructions of your caregivers.   °Sit on chairs with arms. Use the chair arms to help push yourself up when arising.  °Arrange for the use of a toilet seat elevator so you are not sitting low.  °· Walk with walker as instructed.  °You may resume a sexual relationship in one month or when given the OK by your caregiver.  °Use walker as long as suggested by your caregivers.  °You may put full weight on your legs and walk as much as is comfortable. °Avoid periods of inactivity such as sitting longer than an hour when not asleep. This helps prevent  blood clots.  °You may return to work once you are cleared by your surgeon.  °Do not drive a car for 6 weeks or until released by your surgeon.  °Do not drive while taking narcotics.  °Wear elastic stockings for two weeks following surgery during the day but you may remove then at night.  °Make sure you keep all of your appointments after your operation with all of your doctors and caregivers. You should call the office at the above phone number and make an appointment for approximately two weeks after the date of your surgery. °Please pick up a stool softener and laxative for home use as long as you are requiring pain medications. °· ICE to the affected hip every three hours for 30 minutes at a time and then as needed for pain and swelling. Continue to use ice on the hip for pain and swelling from surgery. You may notice swelling that will progress down to the foot and ankle.  This is normal after surgery.  Elevate the leg when you are not up walking on it.   °It is important for you to complete the blood thinner medication as prescribed by your doctor. °· Continue to use the breathing machine which will help keep your temperature down.  It is common for your temperature to cycle up and down following surgery, especially at night when you are not up moving around and exerting yourself.  The breathing machine keeps your lungs expanded and your temperature down. ° °RANGE OF MOTION AND STRENGTHENING EXERCISES  °These exercises are   designed to help you keep full movement of your hip joint. Follow your caregiver's or physical therapist's instructions. Perform all exercises about fifteen times, three times per day or as directed. Exercise both hips, even if you have had only one joint replacement. These exercises can be done on a training (exercise) mat, on the floor, on a table or on a bed. Use whatever works the best and is most comfortable for you. Use music or television while you are exercising so that the exercises  are a pleasant break in your day. This will make your life better with the exercises acting as a break in routine you can look forward to.  °Lying on your back, slowly slide your foot toward your buttocks, raising your knee up off the floor. Then slowly slide your foot back down until your leg is straight again.  °Lying on your back spread your legs as far apart as you can without causing discomfort.  °Lying on your side, raise your upper leg and foot straight up from the floor as far as is comfortable. Slowly lower the leg and repeat.  °Lying on your back, tighten up the muscle in the front of your thigh (quadriceps muscles). You can do this by keeping your leg straight and trying to raise your heel off the floor. This helps strengthen the largest muscle supporting your knee.  °Lying on your back, tighten up the muscles of your buttocks both with the legs straight and with the knee bent at a comfortable angle while keeping your heel on the floor.  ° °SKILLED REHAB INSTRUCTIONS: °If the patient is transferred to a skilled rehab facility following release from the hospital, a list of the current medications will be sent to the facility for the patient to continue.  When discharged from the skilled rehab facility, please have the facility set up the patient's Home Health Physical Therapy prior to being released. Also, the skilled facility will be responsible for providing the patient with their medications at time of release from the facility to include their pain medication and their blood thinner medication. If the patient is still at the rehab facility at time of the two week follow up appointment, the skilled rehab facility will also need to assist the patient in arranging follow up appointment in our office and any transportation needs. ° °MAKE SURE YOU:  °Understand these instructions.  °Will watch your condition.  °Will get help right away if you are not doing well or get worse. ° °Pick up stool softner and  laxative for home use following surgery while on pain medications. °Do not remove your dressing. °The dressing is waterproof--it is OK to take showers. °Continue to use ice for pain and swelling after surgery. °Do not use any lotions or creams on the incision until instructed by your surgeon. °Total Hip Protocol. ° ° °

## 2018-06-26 NOTE — Interval H&P Note (Signed)
History and Physical Interval Note:  06/26/2018 7:38 AM  Caitlyn Anderson  has presented today for surgery, with the diagnosis of Degenerative joint disease left hip  The various methods of treatment have been discussed with the patient and family. After consideration of risks, benefits and other options for treatment, the patient has consented to  Procedure(s): TOTAL HIP ARTHROPLASTY ANTERIOR APPROACH (Left) as a surgical intervention .  The patient's history has been reviewed, patient examined, no change in status, stable for surgery.  I have reviewed the patient's chart and labs.  Questions were answered to the patient's satisfaction.     Hilton Cork Macarius Ruark

## 2018-06-27 ENCOUNTER — Encounter (HOSPITAL_COMMUNITY): Payer: Self-pay | Admitting: Orthopedic Surgery

## 2018-06-27 LAB — BASIC METABOLIC PANEL
Anion gap: 9 (ref 5–15)
BUN: 11 mg/dL (ref 6–20)
CO2: 25 mmol/L (ref 22–32)
Calcium: 7.9 mg/dL — ABNORMAL LOW (ref 8.9–10.3)
Chloride: 105 mmol/L (ref 98–111)
Creatinine, Ser: 0.72 mg/dL (ref 0.44–1.00)
GFR calc non Af Amer: >60 mL/min (ref >60)
Glucose, Bld: 166 mg/dL — ABNORMAL HIGH (ref 70–99)
Potassium: 4.8 mmol/L (ref 3.5–5.1)
Sodium: 139 mmol/L (ref 135–145)

## 2018-06-27 LAB — CBC
HCT: 19.4 % — ABNORMAL LOW (ref 36.0–46.0)
HEMOGLOBIN: 6.2 g/dL — AB (ref 12.0–15.0)
MCH: 32.3 pg (ref 26.0–34.0)
MCHC: 32 g/dL (ref 30.0–36.0)
MCV: 101 fL — ABNORMAL HIGH (ref 80.0–100.0)
Platelets: 145 10*3/uL — ABNORMAL LOW (ref 150–400)
RBC: 1.92 MIL/uL — ABNORMAL LOW (ref 3.87–5.11)
RDW: 13.1 % (ref 11.5–15.5)
WBC: 8.3 10*3/uL (ref 4.0–10.5)
nRBC: 0 % (ref 0.0–0.2)

## 2018-06-27 LAB — GLUCOSE, CAPILLARY
GLUCOSE-CAPILLARY: 203 mg/dL — AB (ref 70–99)
Glucose-Capillary: 139 mg/dL — ABNORMAL HIGH (ref 70–99)
Glucose-Capillary: 155 mg/dL — ABNORMAL HIGH (ref 70–99)
Glucose-Capillary: 151 mg/dL — ABNORMAL HIGH (ref 70–99)

## 2018-06-27 LAB — PREPARE RBC (CROSSMATCH)

## 2018-06-27 MED ORDER — DOCUSATE SODIUM 100 MG PO CAPS: 100.0000 mg | ORAL_CAPSULE | Freq: Two times a day (BID) | ORAL | 1 refills | Status: DC

## 2018-06-27 MED ORDER — SODIUM CHLORIDE 0.9% IV SOLUTION
Freq: Once | INTRAVENOUS | Status: AC
  Administered 2018-06-27: 10:00:00 via INTRAVENOUS

## 2018-06-27 MED ORDER — HYDROCODONE-ACETAMINOPHEN 5-325 MG PO TABS: 1.0000 | ORAL_TABLET | Freq: Four times a day (QID) | ORAL | 0 refills | Status: DC | PRN

## 2018-06-27 MED ORDER — ONDANSETRON HCL 4 MG PO TABS: 4.0000 mg | ORAL_TABLET | Freq: Four times a day (QID) | ORAL | 0 refills | Status: DC | PRN

## 2018-06-27 MED ORDER — SENNA 8.6 MG PO TABS: 1.0000 | ORAL_TABLET | Freq: Two times a day (BID) | ORAL | 0 refills | Status: DC

## 2018-06-27 MED ORDER — ASPIRIN 81 MG PO CHEW: 81.0000 mg | CHEWABLE_TABLET | Freq: Two times a day (BID) | ORAL | 1 refills | Status: DC

## 2018-06-27 NOTE — Progress Notes (Signed)
Physical Therapy Treatment Patient Details Name: Caitlyn Anderson MRN: [id number] DOB: 29-Jun-1961 Today's Date: 06/27/2018    History of Present Illness Pt s/p L THR and with hx of L BBB and DM    PT Comments    Pt motivated and progressing steadily with mobility.     Follow Up Recommendations  Follow surgeon's recommendation for DC plan and follow-up therapies     Equipment Recommendations  None recommended by PT    Recommendations for Other Services       Precautions / Restrictions Precautions Precautions: Fall Restrictions Weight Bearing Restrictions: No Other Position/Activity Restrictions: WBAT    Mobility  Bed Mobility Overal bed mobility: Needs Assistance Bed Mobility: Sit to Supine     Supine to sit: Min assist;Mod assist Sit to supine: Min assist;Mod assist   General bed mobility comments: cues for sequence and use of R LE to self assist.  physical assist to manage L LE and to bring trunk to upright  Transfers Overall transfer level: Needs assistance Equipment used: Rolling walker (2 wheeled) Transfers: Sit to/from Stand Sit to Stand: Min assist;Mod assist         General transfer comment: cues for LE management and use of UEs to self assist  Ambulation/Gait Ambulation/Gait assistance: Min assist Gait Distance (Feet): 75 Feet Assistive device: Rolling walker (2 wheeled) Gait Pattern/deviations: Step-to pattern;Step-through pattern;Decreased step length - right;Decreased step length - left;Shuffle;Trunk flexed Gait velocity: decr   General Gait Details: cues for posture, position from RW and initial sequence   Stairs             Wheelchair Mobility    Modified Rankin (Stroke Patients Only)       Balance Overall balance assessment: Mild deficits observed, not formally tested                                          Cognition Arousal/Alertness: Awake/alert Behavior During Therapy: WFL for tasks  assessed/performed Overall Cognitive Status: Within Functional Limits for tasks assessed                                        Exercises Total Joint Exercises Ankle Circles/Pumps: AROM;Both;15 reps;Supine Quad Sets: AROM;Both;10 reps;Supine Heel Slides: AAROM;Left;20 reps;Supine Hip ABduction/ADduction: AAROM;Left;15 reps;Supine    General Comments        Pertinent Vitals/Pain Pain Assessment: 0-10 Pain Score: 3  Pain Location: L hip Pain Descriptors / Indicators: Aching;Sore Pain Intervention(s): Limited activity within patient's tolerance;Monitored during session;Premedicated before session;Ice applied    Home Living Family/patient expects to be discharged to:: Private residence Living Arrangements: Spouse/significant other Available Help at Discharge: Family;Available PRN/intermittently Type of Home: House Home Access: Stairs to enter Entrance Stairs-Rails: None Home Layout: One level Home Equipment: Cane - single point;Walker - 4 wheels      Prior Function Level of Independence: Independent with assistive device(s)      Comments: plays piano at church, has 2 grandchildren, enjoys crocheting   PT Goals (current goals can now be found in the care plan section) Acute Rehab PT Goals Patient Stated Goal: Play with grandchildren again PT Goal Formulation: With patient Time For Goal Achievement: 07/04/18 Potential to Achieve Goals: Good Progress towards PT goals: Progressing toward goals    Frequency    7X/week  PT Plan Current plan remains appropriate    Co-evaluation              AM-PAC PT "6 Clicks" Mobility   Outcome Measure  Help needed turning from your back to your side while in a flat bed without using bedrails?: A Lot Help needed moving from lying on your back to sitting on the side of a flat bed without using bedrails?: A Lot Help needed moving to and from a bed to a chair (including a wheelchair)?: A Lot Help needed  standing up from a chair using your arms (e.g., wheelchair or bedside chair)?: A Lot Help needed to walk in hospital room?: A Little Help needed climbing 3-5 steps with a railing? : A Lot 6 Click Score: 13    End of Session Equipment Utilized During Treatment: Gait belt Activity Tolerance: Patient tolerated treatment well Patient left: with call bell/phone within reach;in bed;with nursing/sitter in room;with family/visitor present Nurse Communication: Mobility status PT Visit Diagnosis: Difficulty in walking, not elsewhere classified (R26.2)     Time: 6599-3570 PT Time Calculation (min) (ACUTE ONLY): 18 min  Charges:  $Gait Training: 8-22 mins                     Trent Pager [phone] Office [phone]    Kevan Prouty 06/27/2018, 3:46 PM

## 2018-06-27 NOTE — Progress Notes (Addendum)
    Subjective:  Patient reports pain as mild to moderate.  Denies N/V/CP/SOB. C/o bilateral hand paresthesia o/n - improving now.  Objective:   VITALS:   Vitals:   06/27/18 0603 06/27/18 0935 06/27/18 1019 06/27/18 1046  BP: 102/61 103/60 (!) 97/53 (!) 96/56  Pulse: 86 92 91 93  Resp: 18 14 18 18   Temp: 98.4 F (36.9 C) (!) 97.5 F (36.4 C) 98.5 F (36.9 C) 98.9 F (37.2 C)  TempSrc: Oral Oral Oral Oral  SpO2: 100% 97% 92% 92%  Weight:      Height:        NAD ABD soft Sensation intact distally Intact pulses distally Dorsiflexion/Plantar flexion intact Incision: dressing C/D/I Compartment soft  BUE: mild hand swelling. (-) ecchymosis, deformity. Nl strength. BCR. SILT R/U/M.  Lab Results  Component Value Date   WBC 8.3 06/27/2018   HGB 6.2 (LL) 06/27/2018   HCT 19.4 (L) 06/27/2018   MCV 101.0 (H) 06/27/2018   PLT 145 (L) 06/27/2018   BMET    Component Value Date/Time   NA 139 06/27/2018 0514   K 4.8 06/27/2018 0514   CL 105 06/27/2018 0514   CO2 25 06/27/2018 0514   GLUCOSE 166 (H) 06/27/2018 0514   BUN 11 06/27/2018 0514   CREATININE 0.72 06/27/2018 0514   CALCIUM 7.9 (L) 06/27/2018 0514   GFRNONAA >60 06/27/2018 0514   GFRAA >60 06/27/2018 0514     Assessment/Plan: 1 Day Post-Op   Principal Problem:   Primary osteoarthritis of left hip Active Problems:   Osteoarthritis of left hip   WBAT with walker DVT ppx: Aspirin, SCDs, TEDS PO pain control PT/OT ABLA: transfuse 2 units PRBCs for hypotension Hand paresthesias: likely due to IVFs and swelling, no motor weakness, improving, monitor Dispo: D/C home with HEP when medically sable, tomorrow vs Sunday   Hilton Cork Jeffrie Stander 06/27/2018, 12:49 PM   Rod Can, MD Cell: [phone] [facility]  [place] [address], [place], [place], [place] [address] Phone: [phone] [email or web address] Facebook  [employer]

## 2018-06-27 NOTE — Plan of Care (Signed)
Pt alert and oriented, resting with husband at the bedside.  Plan to transfuse 2 units RBCs per MD order.  Education done, pt agreeable.  RN will monitor.

## 2018-06-27 NOTE — Progress Notes (Signed)
Physical Therapy Treatment Patient Details Name: Caitlyn Anderson MRN: [id number] DOB: 1961/05/28 Today's Date: 06/27/2018    History of Present Illness Pt s/p L THR and with hx of L BBB and DM    PT Comments    Pt progressed to ambulating short distance in hall this pm - c/o fatigue but no c/o dizziness.   Follow Up Recommendations  Follow surgeon's recommendation for DC plan and follow-up therapies     Equipment Recommendations  None recommended by PT    Recommendations for Other Services       Precautions / Restrictions Precautions Precautions: Fall Restrictions Weight Bearing Restrictions: No Other Position/Activity Restrictions: WBAT    Mobility  Bed Mobility Overal bed mobility: Needs Assistance Bed Mobility: Supine to Sit     Supine to sit: Min assist;Mod assist     General bed mobility comments: cues for sequence and use of R LE to self assist.  physical assist to manage L LE and to bring trunk to upright  Transfers Overall transfer level: Needs assistance Equipment used: Rolling walker (2 wheeled) Transfers: Sit to/from Stand Sit to Stand: Min assist;Mod assist         General transfer comment: cues for LE management and use of UEs to self assist  Ambulation/Gait Ambulation/Gait assistance: Min assist Gait Distance (Feet): 70 Feet Assistive device: Rolling walker (2 wheeled) Gait Pattern/deviations: Step-to pattern;Step-through pattern;Decreased step length - right;Decreased step length - left;Shuffle;Trunk flexed Gait velocity: decr   General Gait Details: cues for posture, position from RW and initial sequence   Stairs             Wheelchair Mobility    Modified Rankin (Stroke Patients Only)       Balance Overall balance assessment: Mild deficits observed, not formally tested                                          Cognition Arousal/Alertness: Awake/alert Behavior During Therapy: WFL for tasks  assessed/performed Overall Cognitive Status: Within Functional Limits for tasks assessed                                        Exercises Total Joint Exercises Ankle Circles/Pumps: AROM;Both;15 reps;Supine Quad Sets: AROM;Both;10 reps;Supine Heel Slides: AAROM;Left;20 reps;Supine Hip ABduction/ADduction: AAROM;Left;15 reps;Supine    General Comments        Pertinent Vitals/Pain Pain Assessment: 0-10 Pain Score: 3  Pain Location: L hip Pain Descriptors / Indicators: Aching;Sore Pain Intervention(s): Limited activity within patient's tolerance;Monitored during session;Premedicated before session;Ice applied    Home Living Family/patient expects to be discharged to:: Private residence Living Arrangements: Spouse/significant other Available Help at Discharge: Family;Available PRN/intermittently Type of Home: House Home Access: Stairs to enter Entrance Stairs-Rails: None Home Layout: One level Home Equipment: Cane - single point;Walker - 4 wheels      Prior Function Level of Independence: Independent with assistive device(s)      Comments: plays piano at church, has 2 grandchildren, enjoys crocheting   PT Goals (current goals can now be found in the care plan section) Acute Rehab PT Goals Patient Stated Goal: Play with grandchildren again PT Goal Formulation: With patient Time For Goal Achievement: 07/04/18 Potential to Achieve Goals: Good Progress towards PT goals: Progressing toward goals    Frequency  7X/week      PT Plan Current plan remains appropriate    Co-evaluation              AM-PAC PT "6 Clicks" Mobility   Outcome Measure  Help needed turning from your back to your side while in a flat bed without using bedrails?: A Lot Help needed moving from lying on your back to sitting on the side of a flat bed without using bedrails?: A Lot Help needed moving to and from a bed to a chair (including a wheelchair)?: A Lot Help needed  standing up from a chair using your arms (e.g., wheelchair or bedside chair)?: A Lot Help needed to walk in hospital room?: A Little Help needed climbing 3-5 steps with a railing? : A Lot 6 Click Score: 13    End of Session Equipment Utilized During Treatment: Gait belt Activity Tolerance: Patient tolerated treatment well Patient left: with call bell/phone within reach;in chair Nurse Communication: Mobility status PT Visit Diagnosis: Difficulty in walking, not elsewhere classified (R26.2)     Time: 3704-8889 PT Time Calculation (min) (ACUTE ONLY): 21 min  Charges:  $Gait Training: 8-22 mins                     [place] Pager [phone] Office [phone]    Trennon Torbeck 06/27/2018, 1:37 PM

## 2018-06-27 NOTE — Evaluation (Signed)
Physical Therapy Evaluation Patient Details Name: ONNA NODAL MRN: [id number] DOB: 1961-04-23 Today's Date: 06/27/2018   History of Present Illness  Pt s/p L THR and with hx of L BBB and DM  Clinical Impression  Pt s/p L THR and presents with decreased L LE strength/ROM and post op pain limiting functional mobility.  Pt should progress to dc home with family assist.    Follow Up Recommendations Follow surgeon's recommendation for DC plan and follow-up therapies    Equipment Recommendations  None recommended by PT    Recommendations for Other Services       Precautions / Restrictions Precautions Precautions: Fall Restrictions Weight Bearing Restrictions: No Other Position/Activity Restrictions: WBAT      Mobility  Bed Mobility               General bed mobility comments: OOB deferred - pt with Hgb 6.2 and transfusion in progress  Transfers                    Ambulation/Gait                Stairs            Wheelchair Mobility    Modified Rankin (Stroke Patients Only)       Balance                                             Pertinent Vitals/Pain Pain Assessment: 0-10 Pain Score: 3  Pain Location: L hip Pain Descriptors / Indicators: Aching;Sore Pain Intervention(s): Limited activity within patient's tolerance;Monitored during session;Premedicated before session;Ice applied    Home Living Family/patient expects to be discharged to:: Private residence Living Arrangements: Spouse/significant other Available Help at Discharge: Family;Available PRN/intermittently Type of Home: House Home Access: Stairs to enter Entrance Stairs-Rails: None Entrance Stairs-Number of Steps: 3 Home Layout: One level Home Equipment: Cane - single point;Walker - 4 wheels      Prior Function Level of Independence: Independent with assistive device(s)         Comments: plays piano at church, has 2 grandchildren, enjoys  crocheting     Hand Dominance   Dominant Hand: Left    Extremity/Trunk Assessment   Upper Extremity Assessment Upper Extremity Assessment: Overall WFL for tasks assessed    Lower Extremity Assessment Lower Extremity Assessment: LLE deficits/detail LLE Deficits / Details: 2/5 strength at hip with AAROM at hip to 85 flex and 15 abd    Cervical / Trunk Assessment Cervical / Trunk Assessment: Normal  Communication   Communication: No difficulties  Cognition Arousal/Alertness: Awake/alert Behavior During Therapy: WFL for tasks assessed/performed Overall Cognitive Status: Within Functional Limits for tasks assessed                                        General Comments      Exercises Total Joint Exercises Ankle Circles/Pumps: AROM;Both;15 reps;Supine Quad Sets: AROM;Both;10 reps;Supine Heel Slides: AAROM;Left;20 reps;Supine Hip ABduction/ADduction: AAROM;Left;15 reps;Supine   Assessment/Plan    PT Assessment Patient needs continued PT services  PT Problem List Decreased strength;Decreased range of motion;Decreased activity tolerance;Decreased mobility;Decreased knowledge of use of DME;Obesity;Pain       PT Treatment Interventions DME instruction;Gait training;Stair training;Functional mobility training;Therapeutic activities;Therapeutic exercise;Patient/family education    PT Goals (  Current goals can be found in the Care Plan section)  Acute Rehab PT Goals Patient Stated Goal: Play with grandchildren again PT Goal Formulation: With patient Time For Goal Achievement: 07/04/18 Potential to Achieve Goals: Good    Frequency 7X/week   Barriers to discharge        Co-evaluation               AM-PAC PT "6 Clicks" Mobility  Outcome Measure Help needed turning from your back to your side while in a flat bed without using bedrails?: A Lot Help needed moving from lying on your back to sitting on the side of a flat bed without using bedrails?: A  Lot Help needed moving to and from a bed to a chair (including a wheelchair)?: A Lot Help needed standing up from a chair using your arms (e.g., wheelchair or bedside chair)?: A Lot Help needed to walk in hospital room?: A Lot Help needed climbing 3-5 steps with a railing? : Total 6 Click Score: 11    End of Session   Activity Tolerance: Patient tolerated treatment well Patient left: in bed;with call bell/phone within reach Nurse Communication: Mobility status PT Visit Diagnosis: Difficulty in walking, not elsewhere classified (R26.2)    Time: 7282-0601 PT Time Calculation (min) (ACUTE ONLY): 21 min   Charges:   PT Evaluation $PT Eval Low Complexity: 1 Low          [place] Pager [phone] Office [phone]   Clyde Upshaw 06/27/2018, 12:34 PM

## 2018-06-27 NOTE — Discharge Summary (Signed)
Physician Discharge Summary  Patient ID: Caitlyn Anderson MRN: [id number] DOB/AGE: 12-23-1960 57 y.o.  Admit date: 06/26/2018 Discharge date: 06/28/2018  Admission Diagnoses:  Primary osteoarthritis of left hip  Discharge Diagnoses:  Principal Problem:   Primary osteoarthritis of left hip Active Problems:   Osteoarthritis of left hip   Past Medical History:  Diagnosis Date  . Allergic rhinitis   . Asthma   . 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 Dyspnea   . GERD (gastroesophageal reflux disease)   . Hyperlipidemia   . Hypertension   . Hypothyroidism   . Left bundle branch block   . Tracheal stenosis     Surgeries: Procedure(s): TOTAL HIP ARTHROPLASTY ANTERIOR APPROACH on 06/26/2018   Consultants (if any):   Discharged Condition: Improved  Hospital Course: Caitlyn Anderson is an 57 y.o. female who was admitted 06/26/2018 with a diagnosis of Primary osteoarthritis of left hip and went to the operating room on 06/26/2018 and underwent the above named procedures.    She was given perioperative antibiotics:  Anti-infectives (From admission, onward)   Start     Dose/Rate Route Frequency Ordered Stop   06/26/18 2030  vancomycin (VANCOCIN) IVPB 1000 mg/200 mL premix     1,000 mg 200 mL/hr over 60 Minutes Intravenous Every 12 hours 06/26/18 1626 06/25/18 2200   06/26/18 0835  vancomycin (VANCOCIN) 1-5 GM/200ML-% IVPB    Note to Pharmacy:  Jovita Gamma   : cabinet override      06/26/18 0835 06/26/18 2044   06/26/18 0600  clindamycin (CLEOCIN) IVPB 900 mg     900 mg 100 mL/hr over 30 Minutes Intravenous On call to O.R. 06/26/18 3500 06/26/18 0759    .  She was given sequential compression devices, early ambulation, and ASA for DVT prophylaxis.  She required 2 units PRBCs for hgb of 6.2 with response to 9.8.  She benefited maximally from the hospital stay and there were no complications.    Recent vital signs:  Vitals:   06/28/18 0205 06/28/18 0454   BP: 137/77 114/74  Pulse: 86 73  Resp: 16 16  Temp: 97.9 F (36.6 C) 97.9 F (36.6 C)  SpO2: 96% 96%    Recent laboratory studies:  Lab Results  Component Value Date   HGB 9.8 (L) 06/28/2018   HGB 6.2 (LL) 06/27/2018   HGB 8.2 (L) 06/26/2018   Lab Results  Component Value Date   WBC 13.5 (H) 06/28/2018   PLT 174 06/28/2018   Lab Results  Component Value Date   INR 1.01 05/09/2018   Lab Results  Component Value Date   NA 135 06/28/2018   K 4.0 06/28/2018   CL 104 06/28/2018   CO2 25 06/28/2018   BUN 16 06/28/2018   CREATININE 0.68 06/28/2018   GLUCOSE 195 (H) 06/28/2018    Discharge Medications:   Allergies as of 06/28/2018      Reactions   Penicillins Hives, Rash, Other (See Comments)   Has patient had a PCN reaction causing immediate rash, facial/tongue/throat swelling, SOB or lightheadedness with hypotension: No Has patient had a PCN reaction causing severe rash involving mucus membranes or skin necrosis: No Has patient had a PCN reaction that required hospitalization: No Has patient had a PCN reaction occurring within the last 10 years: No If all of the above answers are "NO", then may proceed with Cephalosporin use.      Medication List    STOP taking these medications  acetaminophen 500 MG tablet Commonly known as:  TYLENOL   aspirin 81 MG tablet Replaced by:  aspirin 81 MG chewable tablet     TAKE these medications   aspirin 81 MG chewable tablet Chew 1 tablet (81 mg total) by mouth 2 (two) times daily. Replaces:  aspirin 81 MG tablet   atorvastatin 20 MG tablet Commonly known as:  LIPITOR Take 1 tablet (20 mg total) by mouth daily.   blood glucose meter kit and supplies Kit Dispense based on patient and insurance preference. Use up to four times daily as directed. Dx: E11.9   cetirizine 10 MG tablet Commonly known as:  ZYRTEC Take 10 mg by mouth at bedtime.   docusate sodium 100 MG capsule Commonly known as:  COLACE Take 1 capsule  (100 mg total) by mouth 2 (two) times daily.   escitalopram 10 MG tablet Commonly known as:  LEXAPRO Take 10 mg by mouth daily.   fluticasone 50 MCG/ACT nasal spray Commonly known as:  FLONASE Place 1-2 sprays into both nostrils daily as needed for allergies.   GENTEAL OP Place 1 drop into both eyes at bedtime as needed (for dry eyes).   HYDROcodone-acetaminophen 5-325 MG tablet Commonly known as:  NORCO/VICODIN Take 1-2 tablets by mouth every 6 (six) hours as needed for moderate pain (pain score 4-6).   ibuprofen 200 MG tablet Commonly known as:  ADVIL,MOTRIN Take 600 mg by mouth every 6 (six) hours as needed for headache or moderate pain.   levothyroxine 75 MCG tablet Commonly known as:  SYNTHROID, LEVOTHROID Take 75 mcg by mouth daily before breakfast.   metFORMIN 1000 MG tablet Commonly known as:  GLUCOPHAGE Take 1,000 mg by mouth daily.   ondansetron 4 MG tablet Commonly known as:  ZOFRAN Take 1 tablet (4 mg total) by mouth every 6 (six) hours as needed for nausea.   PROAIR HFA 108 (90 Base) MCG/ACT inhaler Generic drug:  albuterol Inhale 2 puffs into the lungs 4 (four) times daily as needed for wheezing or shortness of breath.   senna 8.6 MG Tabs tablet Commonly known as:  SENOKOT Take 1 tablet (8.6 mg total) by mouth 2 (two) times daily.   valsartan 160 MG tablet Commonly known as:  DIOVAN Take 1 tablet (160 mg total) by mouth daily.   Vitamin D3 50 MCG (2000 UT) Tabs Take 2,000 Units by mouth daily.       Diagnostic Studies: Dg Pelvis Portable  Result Date: 06/26/2018 CLINICAL DATA:  Status post left hip replacement EXAM: PORTABLE PELVIS 1-2 VIEWS COMPARISON:  None. FINDINGS: Pelvic ring is intact. Left hip prosthesis is seen in satisfactory position. No acute bony or soft tissue abnormality is noted. IMPRESSION: Status post left hip replacement Electronically Signed   By: Inez Catalina M.D.   On: 06/26/2018 12:38   Dg C-arm 1-60 Min-no Report  Result  Date: 06/26/2018 Fluoroscopy was utilized by the requesting physician.  No radiographic interpretation.   Dg Hip Operative Unilat W Or W/o Pelvis Left  Result Date: 06/26/2018 CLINICAL DATA:  Status post left hip replacement. EXAM: OPERATIVE left HIP (WITH PELVIS IF PERFORMED) 2 VIEWS TECHNIQUE: Fluoroscopic spot image(s) were submitted for interpretation post-operatively. Radiation exposure index: 1.2612 mGy. COMPARISON:  None. FINDINGS: Two intraoperative fluoroscopic images demonstrate the left acetabular and femoral components to be well situated. No fracture or dislocation is noted. IMPRESSION: Status post left total hip arthroplasty. Electronically Signed   By: Marijo Conception, M.D.   On: 06/26/2018 10:27  Disposition: Discharge disposition: 01-Home or Self Care       Discharge Instructions    Call MD / Call 911   Complete by:  As directed    If you experience chest pain or shortness of breath, CALL 911 and be transported to the hospital emergency room.  If you develope a fever above 101 F, pus (white drainage) or increased drainage or redness at the wound, or calf pain, call your surgeon's office.   Constipation Prevention   Complete by:  As directed    Drink plenty of fluids.  Prune juice may be helpful.  You may use a stool softener, such as Colace (over the counter) 100 mg twice a day.  Use MiraLax (over the counter) for constipation as needed.   Diet - low sodium heart healthy   Complete by:  As directed    Driving restrictions   Complete by:  As directed    No driving for 6 weeks   Follow the hip precautions as taught in Physical Therapy   Complete by:  As directed    Increase activity slowly as tolerated   Complete by:  As directed    Lifting restrictions   Complete by:  As directed    No lifting for 6 weeks      Follow-up Information    Goddess Gebbia, Aaron Edelman, MD. Schedule an appointment as soon as possible for a visit in 2 weeks.   Specialty:  Orthopedic  Surgery Why:  For wound re-check Contact information: [address] [place] [address] [phone]            Signed: Hilton Cork Zaid Tomes 06/28/2018, 9:33 AM

## 2018-06-27 NOTE — Progress Notes (Signed)
2 units of RBCs completed. Pt tolerated well.  RN will monitor.

## 2018-06-27 NOTE — Progress Notes (Signed)
Pt will discharge with HHPT from [facility] at home.

## 2018-06-28 LAB — TYPE AND SCREEN
ABO/RH(D): O POS
Antibody Screen: NEGATIVE
UNIT DIVISION: 0
Unit division: 0

## 2018-06-28 LAB — CBC
HCT: 29.4 % — ABNORMAL LOW (ref 36.0–46.0)
Hemoglobin: 9.8 g/dL — ABNORMAL LOW (ref 12.0–15.0)
MCH: 31.8 pg (ref 26.0–34.0)
MCHC: 33.3 g/dL (ref 30.0–36.0)
MCV: 95.5 fL (ref 80.0–100.0)
Platelets: 174 10*3/uL (ref 150–400)
RBC: 3.08 MIL/uL — ABNORMAL LOW (ref 3.87–5.11)
RDW: 13.4 % (ref 11.5–15.5)
WBC: 13.5 10*3/uL — ABNORMAL HIGH (ref 4.0–10.5)
nRBC: 0 % (ref 0.0–0.2)

## 2018-06-28 LAB — BASIC METABOLIC PANEL
Anion gap: 6 (ref 5–15)
BUN: 16 mg/dL (ref 6–20)
CHLORIDE: 104 mmol/L (ref 98–111)
CO2: 25 mmol/L (ref 22–32)
Calcium: 8.7 mg/dL — ABNORMAL LOW (ref 8.9–10.3)
Creatinine, Ser: 0.68 mg/dL (ref 0.44–1.00)
GFR calc Af Amer: >60 mL/min (ref >60)
GFR calc non Af Amer: >60 mL/min (ref >60)
Glucose, Bld: 195 mg/dL — ABNORMAL HIGH (ref 70–99)
Potassium: 4 mmol/L (ref 3.5–5.1)
SODIUM: 135 mmol/L (ref 135–145)

## 2018-06-28 LAB — GLUCOSE, CAPILLARY
Glucose-Capillary: 156 mg/dL — ABNORMAL HIGH (ref 70–99)
Glucose-Capillary: 143 mg/dL — ABNORMAL HIGH (ref 70–99)

## 2018-06-28 LAB — BPAM RBC
BLOOD PRODUCT EXPIRATION DATE: 202001152359
Blood Product Expiration Date: 202001162359
ISSUE DATE / TIME: 201912201026
ISSUE DATE / TIME: 201912201521
Unit Type and Rh: 5100
Unit Type and Rh: 5100

## 2018-06-28 NOTE — Care Management Note (Signed)
Case Management Note  Patient Details  Name: Caitlyn Anderson MRN: [id number] Date of Birth: Feb 17, 1961  Subjective/Objective:   S/p L THR                 Action/Plan: NCM spoke to pt and scheduled to do HEP. States she has RW and bedside commode. Husband will assist at home.   Expected Discharge Date:  06/28/18               Expected Discharge Plan:  Home/Self Care  In-House Referral:  NA  Discharge planning Services  NA  Post Acute Care Choice:  NA Choice offered to:  NA  DME Arranged:  N/A DME Agency:  NA  HH Arranged:  NA HH Agency:  NA  Status of Service:  Completed, signed off  If discussed at [place] of Stay Meetings, dates discussed:    Additional Comments:  Erenest Rasher, RN 06/28/2018, 12:36 PM

## 2018-06-28 NOTE — Progress Notes (Signed)
Physical Therapy Treatment Patient Details Name: Caitlyn Anderson MRN: [id number] DOB: July 18, 1960 Today's Date: 06/28/2018    History of Present Illness Pt s/p L THR and with hx of L BBB and DM    PT Comments    Pt motivated and progressing well with mobility.  Pt hopeful for dc home this pm   Follow Up Recommendations  Follow surgeon's recommendation for DC plan and follow-up therapies     Equipment Recommendations  None recommended by PT    Recommendations for Other Services       Precautions / Restrictions Precautions Precautions: Fall Restrictions Weight Bearing Restrictions: No Other Position/Activity Restrictions: WBAT    Mobility  Bed Mobility               General bed mobility comments: Pt up in chair and requests back to same  Transfers Overall transfer level: Needs assistance Equipment used: Rolling walker (2 wheeled) Transfers: Sit to/from Stand Sit to Stand: Min guard         General transfer comment: cues for LE management and use of UEs to self assist  Ambulation/Gait Ambulation/Gait assistance: Min guard Gait Distance (Feet): 180 Feet Assistive device: Rolling walker (2 wheeled) Gait Pattern/deviations: Step-to pattern;Step-through pattern;Decreased step length - right;Decreased step length - left;Shuffle;Trunk flexed Gait velocity: decr   General Gait Details: cues for posture, position from RW and initial sequence   Stairs             Wheelchair Mobility    Modified Rankin (Stroke Patients Only)       Balance Overall balance assessment: Mild deficits observed, not formally tested                                          Cognition Arousal/Alertness: Awake/alert Behavior During Therapy: WFL for tasks assessed/performed Overall Cognitive Status: Within Functional Limits for tasks assessed                                        Exercises Total Joint Exercises Ankle Circles/Pumps:  AROM;Both;15 reps;Supine Quad Sets: AROM;Both;10 reps;Supine Heel Slides: AAROM;Left;20 reps;Supine Hip ABduction/ADduction: AAROM;Left;15 reps;Supine Long Arc Quad: AROM;Left;10 reps;Seated    General Comments        Pertinent Vitals/Pain Pain Assessment: 0-10 Pain Score: 3  Pain Location: L hip Pain Descriptors / Indicators: Aching;Sore Pain Intervention(s): Limited activity within patient's tolerance;Monitored during session;Premedicated before session;Ice applied    Home Living                      Prior Function            PT Goals (current goals can now be found in the care plan section) Acute Rehab PT Goals Patient Stated Goal: Play with grandchildren again PT Goal Formulation: With patient Time For Goal Achievement: 07/04/18 Potential to Achieve Goals: Good Progress towards PT goals: Progressing toward goals    Frequency    7X/week      PT Plan Current plan remains appropriate    Co-evaluation              AM-PAC PT "6 Clicks" Mobility   Outcome Measure  Help needed turning from your back to your side while in a flat bed without using bedrails?: A Little Help needed moving from  lying on your back to sitting on the side of a flat bed without using bedrails?: A Little Help needed moving to and from a bed to a chair (including a wheelchair)?: A Little Help needed standing up from a chair using your arms (e.g., wheelchair or bedside chair)?: A Little Help needed to walk in hospital room?: A Little Help needed climbing 3-5 steps with a railing? : A Little 6 Click Score: 18    End of Session Equipment Utilized During Treatment: Gait belt Activity Tolerance: Patient tolerated treatment well Patient left: in chair;with call bell/phone within reach;with family/visitor present Nurse Communication: Mobility status PT Visit Diagnosis: Difficulty in walking, not elsewhere classified (R26.2)     Time: 1683-7290 PT Time Calculation (min) (ACUTE  ONLY): 28 min  Charges:  $Gait Training: 8-22 mins $Therapeutic Exercise: 8-22 mins                     [place] Pager [phone] Office [phone]    Gemma Ruan 06/28/2018, 12:21 PM

## 2018-06-28 NOTE — Progress Notes (Signed)
Physical Therapy Treatment Patient Details Name: Caitlyn Anderson MRN: [id number] DOB: 26-Dec-1960 Today's Date: 06/28/2018    History of Present Illness Pt s/p L THR and with hx of L BBB and DM    PT Comments    Pt progressing well with mobility and eager for dc home.  Spouse present and reviewed car transfers, stairs and home therex program with written instruction provided.    Follow Up Recommendations  Follow surgeon's recommendation for DC plan and follow-up therapies     Equipment Recommendations  None recommended by PT    Recommendations for Other Services       Precautions / Restrictions Precautions Precautions: Fall Restrictions Weight Bearing Restrictions: No Other Position/Activity Restrictions: WBAT    Mobility  Bed Mobility               General bed mobility comments: Pt up in chair and requests back to same  Transfers Overall transfer level: Needs assistance Equipment used: Rolling walker (2 wheeled) Transfers: Sit to/from Stand Sit to Stand: Supervision         General transfer comment: cues for LE management and use of UEs to self assist  Ambulation/Gait Ambulation/Gait assistance: Min guard;Supervision Gait Distance (Feet): 100 Feet Assistive device: Rolling walker (2 wheeled) Gait Pattern/deviations: Step-to pattern;Step-through pattern;Decreased step length - right;Decreased step length - left;Shuffle;Trunk flexed Gait velocity: decr   General Gait Details: min cues for posture, position from RW and initial sequence   Stairs Stairs: Yes           Wheelchair Mobility    Modified Rankin (Stroke Patients Only)       Balance Overall balance assessment: Mild deficits observed, not formally tested                                          Cognition Arousal/Alertness: Awake/alert Behavior During Therapy: WFL for tasks assessed/performed Overall Cognitive Status: Within Functional Limits for tasks assessed                                        Exercises Total Joint Exercises Ankle Circles/Pumps: AROM;Both;15 reps;Supine Quad Sets: AROM;Both;10 reps;Supine Heel Slides: AAROM;Left;20 reps;Supine Hip ABduction/ADduction: AAROM;Left;15 reps;Supine Long Arc Quad: AROM;Left;10 reps;Seated    General Comments        Pertinent Vitals/Pain Pain Assessment: 0-10 Pain Score: 4  Pain Location: L hip Pain Descriptors / Indicators: Aching;Sore Pain Intervention(s): Limited activity within patient's tolerance;Monitored during session;RN gave pain meds during session    Home Living                      Prior Function            PT Goals (current goals can now be found in the care plan section) Acute Rehab PT Goals Patient Stated Goal: Play with grandchildren again PT Goal Formulation: With patient Time For Goal Achievement: 07/04/18 Potential to Achieve Goals: Good Progress towards PT goals: Progressing toward goals    Frequency    7X/week      PT Plan Current plan remains appropriate    Co-evaluation              AM-PAC PT "6 Clicks" Mobility   Outcome Measure  Help needed turning from your back to your side while  in a flat bed without using bedrails?: A Little Help needed moving from lying on your back to sitting on the side of a flat bed without using bedrails?: A Little Help needed moving to and from a bed to a chair (including a wheelchair)?: A Little Help needed standing up from a chair using your arms (e.g., wheelchair or bedside chair)?: A Little Help needed to walk in hospital room?: A Little Help needed climbing 3-5 steps with a railing? : A Little 6 Click Score: 18    End of Session Equipment Utilized During Treatment: Gait belt Activity Tolerance: Patient tolerated treatment well Patient left: in chair;with call bell/phone within reach;with family/visitor present Nurse Communication: Mobility status PT Visit Diagnosis: Difficulty  in walking, not elsewhere classified (R26.2)     Time: 1432-1510 PT Time Calculation (min) (ACUTE ONLY): 38 min  Charges:  $Gait Training: 8-22 mins $Therapeutic Exercise: 8-22 mins $Therapeutic Activity: 8-22 mins                     Elliott Pager [phone] Office [phone]    Rue Valladares 06/28/2018, 3:57 PM

## 2018-06-28 NOTE — Progress Notes (Signed)
    Subjective:  Patient reports pain as mild to moderate.  Denies N/V/CP/SOB. States bilateral hand paresthesias resolved  Objective:   VITALS:   Vitals:   06/27/18 1856 06/27/18 2107 06/28/18 0205 06/28/18 0454  BP: 129/71 133/69 137/77 114/74  Pulse: 96 97 86 73  Resp: 18 16 16 16   Temp: 98.3 F (36.8 C) 98.5 F (36.9 C) 97.9 F (36.6 C) 97.9 F (36.6 C)  TempSrc: Oral Oral Oral   SpO2: 95% 94% 96% 96%  Weight:      Height:        NAD ABD soft Sensation intact distally Intact pulses distally Dorsiflexion/Plantar flexion intact Incision: dressing C/D/I Compartment soft  BUE: mild hand swelling. (-) ecchymosis, deformity. Nl strength. BCR. SILT R/U/M.  Lab Results  Component Value Date   WBC 13.5 (H) 06/28/2018   HGB 9.8 (L) 06/28/2018   HCT 29.4 (L) 06/28/2018   MCV 95.5 06/28/2018   PLT 174 06/28/2018   BMET    Component Value Date/Time   NA 135 06/28/2018 0504   K 4.0 06/28/2018 0504   CL 104 06/28/2018 0504   CO2 25 06/28/2018 0504   GLUCOSE 195 (H) 06/28/2018 0504   BUN 16 06/28/2018 0504   CREATININE 0.68 06/28/2018 0504   CALCIUM 8.7 (L) 06/28/2018 0504   GFRNONAA >60 06/28/2018 0504   GFRAA >60 06/28/2018 0504     Assessment/Plan: 2 Days Post-Op   Principal Problem:   Primary osteoarthritis of left hip Active Problems:   Osteoarthritis of left hip   WBAT with walker DVT ppx: Aspirin, SCDs, TEDS PO pain control PT/OT ABLA: received 2 units PRBCs yesterday with excellent response, monitor Hand paresthesias:resolved, monitor Dispo: D/C home with HEP today   Caitlyn Anderson 06/28/2018, 9:30 AM   Rod Can, MD Cell: [phone] [place] is now [facility]  [place] [address], [place], [place], [place] [address] Phone: [phone] [email or web address] Facebook  [employer]

## 2018-08-25 ENCOUNTER — Ambulatory Visit: Payer: BLUE CROSS/BLUE SHIELD | Admitting: Emergency Medicine

## 2018-09-03 ENCOUNTER — Encounter: Payer: Self-pay | Admitting: Emergency Medicine

## 2018-09-03 ENCOUNTER — Ambulatory Visit: Payer: BLUE CROSS/BLUE SHIELD | Admitting: Emergency Medicine

## 2018-09-03 DIAGNOSIS — R05 Cough: Secondary | ICD-10-CM

## 2018-09-03 DIAGNOSIS — R059 Cough, unspecified: Secondary | ICD-10-CM

## 2018-09-03 DIAGNOSIS — J398 Other specified diseases of upper respiratory tract: Secondary | ICD-10-CM

## 2018-09-03 DIAGNOSIS — R911 Solitary pulmonary nodule: Secondary | ICD-10-CM

## 2018-09-03 NOTE — Assessment & Plan Note (Signed)
Sounds like is being principally driven by persistent rhinitis.  She will continue Zyrtec, start taking her fluticasone nasal spray every day instead of as needed.  She does have some GERD and I initially had her on a PPI.  We stopped this.  Hopefully will not need to restart it, depending on her cough.

## 2018-09-03 NOTE — Assessment & Plan Note (Signed)
Small pulmonary nodules, all less than 4 mm and patient is low risk.  These should not need to be followed.

## 2018-09-03 NOTE — Progress Notes (Signed)
Subjective:    Patient ID: Caitlyn Anderson, female    DOB: 12/26/1960, 58 y.o.   MRN: [id number]  HPI  ROV 02/28/18 --Caitlyn Anderson returns today to discuss her shortness of breath stridor and cough.  She underwent bronchoscopy on 02/03/2018 after her spirometry showed an abnormal flow volume loop consistent with a suspected intrathoracic fixed obstruction.  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  The bronchoscope was able to pass with some difficulty through the remaining orifice and the distal airways appeared to be normal.  Based on this I obtained a CT scan of her neck and chest on 02/19/2018 which I have reviewed.  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 Cytology on the lesion showed atypical cells, no malignancy.  The thoracic trachea and other airways appear to be normal.  I do not see any parenchymal abnormality.  In addition to the work-up above we have continued her on Nexium for any potential contribution of GERD to her upper airway irritation, continued Zyrtec as well.  She is scheduled to see Dr. Roxan Hockey with thoracic surgery next week to discuss the options for addressing the proximal tracheal anatomical obstruction.   For some reason her insurance is rejecting the FOB as an "investigational procedure" - not covering it.   ROV 05/05/18 --Caitlyn Anderson is a 58 year old woman who follows up today for her dyspnea and stridor.  As above her bronchoscopy showed a tracheal web and she has been evaluated by Dr. Roxan Hockey with thoracic surgery.  She underwent web resection 03/24/18, was readmitted with edema, swelling and stridor. She improved with steroids. She is now improved. Her breathing is overall much better than it was when we met pre-procedure. She is set for repeat bronchoscopy 11/11 with Dr Roxan Hockey.  She is on flonase, zyrtec. Ran out of nexium.   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 Roxan Hockey.  She also has allergic rhinitis and reflux that we have felt may contribute to recurrent cough.  She had a repeat bronchoscopy in November that showed that her trachea was widely patent.  There was a small right anterior lateral area of scarring. She then had L hip sgy at the end of the year. She is currently getting over a URI. She continues to deal with allergies, throat clearing. She is on flonase prn, zyrtec. She has occasional GERD - she is no longer on a PPI. Has not needed her albuterol.    Review of Systems  Constitutional: Negative for fever and unexpected weight change.  HENT: Positive for congestion, postnasal drip, rhinorrhea, sinus pressure and sneezing. Negative for dental problem, ear pain, nosebleeds, sore throat and trouble swallowing.   Eyes: Positive for itching. Negative for redness.  Respiratory: Positive for cough, chest tightness, shortness of breath and wheezing.   Cardiovascular: Positive for palpitations. Negative for leg swelling.  Gastrointestinal: Negative for nausea and vomiting.  Genitourinary: Negative for dysuria.  Musculoskeletal: Negative for joint swelling.  Skin: Negative for rash.  Allergic/Immunologic: Positive for environmental allergies. Negative for food allergies and immunocompromised state.  Neurological: Negative for headaches.  Hematological: Does not bruise/bleed easily.  Psychiatric/Behavioral: Negative for dysphoric mood. The patient is nervous/anxious.    Past Medical History:  Diagnosis Date  . Allergic rhinitis   . Asthma   . Diabetes mellitus without complication ([place])  Type ii  . Dyspnea   . GERD (gastroesophageal reflux disease)   . Hyperlipidemia   . Hypertension   . Hypothyroidism   . Left bundle branch block   . Tracheal stenosis      Family History  Problem Relation Age of Onset  . Glaucoma Father     . Macular degeneration Maternal Grandmother      Social History   Socioeconomic History  . Marital status: Married    Spouse name: Not on file  . Number of children: Not on file  . Years of education: Not on file  . Highest education level: Not on file  Occupational History  . Not on file  Social Needs  . Financial resource strain: Not on file  . Food insecurity:    Worry: Not on file    Inability: Not on file  . Transportation needs:    Medical: Not on file    Non-medical: Not on file  Tobacco Use  . Smoking status: Never Smoker  . Smokeless tobacco: Never Used  Substance and Sexual Activity  . Alcohol use: Yes    Comment: 1-2 times a year  . Drug use: Never  . Sexual activity: Not on file  Lifestyle  . Physical activity:    Days per week: Not on file    Minutes per session: Not on file  . Stress: Not on file  Relationships  . Social connections:    Talks on phone: Not on file    Gets together: Not on file    Attends religious service: Not on file    Active member of club or organization: Not on file    Attends meetings of clubs or organizations: Not on file    Relationship status: Not on file  . Intimate partner violence:    Fear of current or ex partner: Not on file    Emotionally abused: Not on file    Physically abused: Not on file    Forced sexual activity: Not on file  Other Topics Concern  . Not on file  Social History Narrative  . 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   Allergies  Allergen Reactions  . Penicillins Hives, Rash and Other (See Comments)    Has patient had a PCN reaction causing immediate rash, facial/tongue/throat swelling, SOB or lightheadedness with hypotension: No Has patient had a PCN reaction causing severe rash involving mucus membranes or skin necrosis: No Has patient had a PCN reaction that required hospitalization: No Has patient had a PCN reaction occurring within the  last 10 years: No If all of the above answers are "NO", then may proceed with Cephalosporin use.      Outpatient Medications Prior to Visit  Medication Sig Dispense Refill  . aspirin 81 MG chewable tablet Chew 1 tablet (81 mg total) by mouth 2 (two) times daily. 60 tablet 1  . blood glucose meter kit and supplies KIT Dispense based on patient and insurance preference. Use up to four times daily as directed. Dx: E11.9 1 each 0  . Carboxymethylcell-Hypromellose (GENTEAL OP) Place 1 drop into both eyes at bedtime as needed (for dry eyes).    . cetirizine (ZYRTEC) 10 MG tablet Take 10 mg by mouth at bedtime.     . Cholecalciferol (VITAMIN D3) 50 MCG (2000 UT) TABS Take 2,000 Units by mouth daily.    Marland Kitchen docusate sodium (COLACE) 100 MG capsule Take  1 capsule (100 mg total) by mouth 2 (two) times daily. 60 capsule 1  . escitalopram (LEXAPRO) 10 MG tablet Take 10 mg by mouth daily.     . fluticasone (FLONASE) 50 MCG/ACT nasal spray Place 1-2 sprays into both nostrils daily as needed for allergies.     Marland Kitchen ibuprofen (ADVIL,MOTRIN) 200 MG tablet Take 600 mg by mouth every 6 (six) hours as needed for headache or moderate pain.    Marland Kitchen levothyroxine (SYNTHROID, LEVOTHROID) 75 MCG tablet Take 75 mcg by mouth daily before breakfast.    . metFORMIN (GLUCOPHAGE) 1000 MG tablet Take 1,000 mg by mouth daily.     . ondansetron (ZOFRAN) 4 MG tablet Take 1 tablet (4 mg total) by mouth every 6 (six) hours as needed for nausea. 20 tablet 0  . valsartan (DIOVAN) 160 MG tablet Take 1 tablet (160 mg total) by mouth daily. 30 tablet 2  . albuterol (PROAIR HFA) 108 (90 Base) MCG/ACT inhaler Inhale 2 puffs into the lungs 4 (four) times daily as needed for wheezing or shortness of breath.     Marland Kitchen atorvastatin (LIPITOR) 20 MG tablet Take 1 tablet (20 mg total) by mouth daily. 30 tablet 0  . HYDROcodone-acetaminophen (NORCO/VICODIN) 5-325 MG tablet Take 1-2 tablets by mouth every 6 (six) hours as needed for moderate pain (pain score  4-6). 50 tablet 0  . senna (SENOKOT) 8.6 MG TABS tablet Take 1 tablet (8.6 mg total) by mouth 2 (two) times daily. 120 each 0   No facility-administered medications prior to visit.         Objective:   Physical Exam Vitals:   09/03/18 1530  BP: 122/70  Pulse: 82  SpO2: 99%  Weight: 194 lb (88 kg)  Height: 5' (1.524 m)   Gen: Pleasant, overwt woman, in no distress,  normal affect  ENT: No lesions,  mouth clear,  oropharynx clear, no postnasal drip  Neck: No JVD, clear no stridor  Lungs: No use of accessory muscles, no stridor, no referred noise  Cardiovascular: RRR, heart sounds normal, no murmur or gallops, trace peripheral edema  Musculoskeletal: No deformities, no cyanosis or clubbing  Neuro: alert, non focal  Skin: Warm, no lesions or rash      Assessment & Plan:  Tracheal stricture Treated, doing well.  She will follow with Dr. Roxan Hockey.  Any repeat imaging or bronchoscopy as per his plans.  She will call if her breathing changes.  Spirometry might be helpful to assess any recurrence of fixed obstruction.  Cough Sounds like is being principally driven by persistent rhinitis.  She will continue Zyrtec, start taking her fluticasone nasal spray every day instead of as needed.  She does have some GERD and I initially had her on a PPI.  We stopped this.  Hopefully will not need to restart it, depending on her cough.  Pulmonary nodule Small pulmonary nodules, all less than 4 mm and patient is low risk.  These should not need to be followed.  Baltazar Apo, MD, PhD 09/03/2018, 4:00 PM [facility] [phone] or if no answer [phone]

## 2018-09-03 NOTE — Assessment & Plan Note (Signed)
Treated, doing well.  She will follow with Dr. Roxan Hockey.  Any repeat imaging or bronchoscopy as per his plans.  She will call if her breathing changes.  Spirometry might be helpful to assess any recurrence of fixed obstruction.

## 2018-09-03 NOTE — Patient Instructions (Addendum)
Try taking your flonase 2 sprays each nostril every day Continue your Zyrtec as you have been taking it. Follow with Dr. Roxan Hockey as planned Follow with Dr. Lamonte Sakai in 12 months or sooner if you have any problems.  Call our office if you have increased coughing or shortness of breath and we will see you sooner.

## 2018-09-10 ENCOUNTER — Other Ambulatory Visit: Payer: Self-pay | Admitting: Emergency Medicine

## 2018-10-10 ENCOUNTER — Other Ambulatory Visit: Payer: Self-pay | Admitting: Physical Medicine and Rehabilitation

## 2018-11-18 ENCOUNTER — Ambulatory Visit: Payer: BLUE CROSS/BLUE SHIELD | Admitting: Thoracic Surgery (Cardiothoracic Vascular Surgery)

## 2019-02-03 ENCOUNTER — Ambulatory Visit: Payer: BLUE CROSS/BLUE SHIELD | Admitting: Thoracic Surgery (Cardiothoracic Vascular Surgery)

## 2019-02-20 ENCOUNTER — Other Ambulatory Visit: Payer: Self-pay | Admitting: Emergency Medicine

## 2019-03-20 ENCOUNTER — Other Ambulatory Visit: Payer: Self-pay | Admitting: Emergency Medicine

## 2019-04-22 ENCOUNTER — Other Ambulatory Visit: Payer: Self-pay | Admitting: Emergency Medicine

## 2019-05-01 ENCOUNTER — Telehealth: Payer: Self-pay | Admitting: Emergency Medicine

## 2019-05-01 NOTE — Telephone Encounter (Signed)
No we will not provide the patient standby steroids due to her being Covid positive.  I recommend that she follow-up with primary care.  If symptoms worsen she will need to contact our office or present to an emergency room or urgent care for further evaluation.  If she does start have worsening symptoms she likely will need to present to the emergency room for chest x-ray/ imaging.  Please schedule patient is a televisit with our office in 1 to 2 weeks with an APP or her pulmonologist to check in with her and see how she is doing with her recent Covid diagnosis.  Wyn Quaker, FNP

## 2019-05-01 NOTE — Telephone Encounter (Signed)
Call returned to patient, made aware of BM recommendations. Voiced understanding. 1 week f/u appt made. Nothing further needed at this time.

## 2019-05-01 NOTE — Telephone Encounter (Signed)
All returned to patient, she reports she tested positive for Covid yesterday. She reports running a slight fever and having body aches. She has been taking tylenol for the fever. Reports she has not have a fever today. [facility] She was there directed by her PCP. She denies SOB, chest discomfort, N/V/D or loss of taste or smell. Her father tested positive so she went to get tested. She reports she thought she should have some steroids on stand by. I made her her aware typically they only send in medications if patients are symptomatic. She reports what should I do if I get worse over the weekend. I made her aware to call 911 if in distress.   BM please advise is there anything we can give the patient for just in case per patient request.

## 2019-05-08 ENCOUNTER — Other Ambulatory Visit: Payer: Self-pay

## 2019-05-08 ENCOUNTER — Encounter: Payer: Self-pay | Admitting: Adult Health

## 2019-05-08 ENCOUNTER — Ambulatory Visit (INDEPENDENT_AMBULATORY_CARE_PROVIDER_SITE_OTHER): Payer: BC Managed Care – PPO | Admitting: Adult Health

## 2019-05-08 DIAGNOSIS — U071 COVID-19: Secondary | ICD-10-CM

## 2019-05-08 DIAGNOSIS — J4530 Mild persistent asthma, uncomplicated: Secondary | ICD-10-CM | POA: Diagnosis not present

## 2019-05-08 NOTE — Patient Instructions (Addendum)
Tylenol as needed for fever Push fluids Continue on Symbicort 2 puffs twice daily Continue on Flonase daily  Continue on Zyrtec daily Activity as tolerated Albuterol as needed Delsym 2 teaspoons twice daily as needed for cough if develops  Follow up as planned and As needed   Please contact office for sooner follow up if symptoms do not improve or worsen or seek emergency care      Person Under Monitoring Name: Caitlyn Anderson  Location: [place] [id number]   Infection Prevention Recommendations for Individuals Confirmed to have, or Being Evaluated for, 2019 Novel Coronavirus (COVID-19) Infection Who Receive Care at Home  Individuals who are confirmed to have, or are being evaluated for, COVID-19 should follow the prevention steps below until a healthcare provider or local or state health department says they can return to normal activities.  Stay home except to get medical care You should restrict activities outside your home, except for getting medical care. Do not go to work, school, or public areas, and do not use public transportation or taxis.  Call ahead before visiting your doctor Before your medical appointment, call the healthcare provider and tell them that you have, or are being evaluated for, COVID-19 infection. This will help the healthcare provider's office take steps to keep other people from getting infected. Ask your healthcare provider to call the local or state health department.  Monitor your symptoms Seek prompt medical attention if your illness is worsening (e.g., difficulty breathing). Before going to your medical appointment, call the healthcare provider and tell them that you have, or are being evaluated for, COVID-19 infection. Ask your healthcare provider to call the local or state health department.  Wear a facemask You should wear a facemask that covers your nose and mouth when you are in the same room with other people and when you  visit a healthcare provider. People who live with or visit you should also wear a facemask while they are in the same room with you.  Separate yourself from other people in your home As much as possible, you should stay in a different room from other people in your home. Also, you should use a separate bathroom, if available.  Avoid sharing household items You should not share dishes, drinking glasses, cups, eating utensils, towels, bedding, or other items with other people in your home. After using these items, you should wash them thoroughly with soap and water.  Cover your coughs and sneezes Cover your mouth and nose with a tissue when you cough or sneeze, or you can cough or sneeze into your sleeve. Throw used tissues in a lined trash can, and immediately wash your hands with soap and water for at least 20 seconds or use an alcohol-based hand rub.  Wash your [facility] your hands often and thoroughly with soap and water for at least 20 seconds. You can use an alcohol-based hand sanitizer if soap and water are not available and if your hands are not visibly dirty. Avoid touching your eyes, nose, and mouth with unwashed hands.   Prevention Steps for Caregivers and Household Members of Individuals Confirmed to have, or Being Evaluated for, COVID-19 Infection Being Cared for in the Home  If you live with, or provide care at home for, a person confirmed to have, or being evaluated for, COVID-19 infection please follow these guidelines to prevent infection:  Follow healthcare provider's instructions Make sure that you understand and can help the patient follow any healthcare  provider instructions for all care.  Provide for the patient's basic needs You should help the patient with basic needs in the home and provide support for getting groceries, prescriptions, and other personal needs.  Monitor the patient's symptoms If they are getting sicker, call his or her medical provider and  tell them that the patient has, or is being evaluated for, COVID-19 infection. This will help the healthcare provider's office take steps to keep other people from getting infected. Ask the healthcare provider to call the local or state health department.  Limit the number of people who have contact with the patient  If possible, have only one caregiver for the patient.  Other household members should stay in another home or place of residence. If this is not possible, they should stay  in another room, or be separated from the patient as much as possible. Use a separate bathroom, if available.  Restrict visitors who do not have an essential need to be in the home.  Keep older adults, very young children, and other sick people away from the patient Keep older adults, very young children, and those who have compromised immune systems or chronic health conditions away from the patient. This includes people with chronic heart, lung, or kidney conditions, diabetes, and cancer.  Ensure good ventilation Make sure that shared spaces in the home have good air flow, such as from an air conditioner or an opened window, weather permitting.  Wash your hands often  Wash your hands often and thoroughly with soap and water for at least 20 seconds. You can use an alcohol based hand sanitizer if soap and water are not available and if your hands are not visibly dirty.  Avoid touching your eyes, nose, and mouth with unwashed hands.  Use disposable paper towels to dry your hands. If not available, use dedicated cloth towels and replace them when they become wet.  Wear a facemask and gloves  Wear a disposable facemask at all times in the room and gloves when you touch or have contact with the patient's blood, body fluids, and/or secretions or excretions, such as sweat, saliva, sputum, nasal mucus, vomit, urine, or feces.  Ensure the mask fits over your nose and mouth tightly, and do not touch it during  use.  Throw out disposable facemasks and gloves after using them. Do not reuse.  Wash your hands immediately after removing your facemask and gloves.  If your personal clothing becomes contaminated, carefully remove clothing and launder. Wash your hands after handling contaminated clothing.  Place all used disposable facemasks, gloves, and other waste in a lined container before disposing them with other household waste.  Remove gloves and wash your hands immediately after handling these items.  Do not share dishes, glasses, or other household items with the patient  Avoid sharing household items. You should not share dishes, drinking glasses, cups, eating utensils, towels, bedding, or other items with a patient who is confirmed to have, or being evaluated for, COVID-19 infection.  After the person uses these items, you should wash them thoroughly with soap and water.  Wash laundry thoroughly  Immediately remove and wash clothes or bedding that have blood, body fluids, and/or secretions or excretions, such as sweat, saliva, sputum, nasal mucus, vomit, urine, or feces, on them.  Wear gloves when handling laundry from the patient.  Read and follow directions on labels of laundry or clothing items and detergent. In general, wash and dry with the warmest temperatures recommended on the label.  Clean all areas the individual has used often  Clean all touchable surfaces, such as counters, tabletops, doorknobs, bathroom fixtures, toilets, phones, keyboards, tablets, and bedside tables, every day. Also, clean any surfaces that may have blood, body fluids, and/or secretions or excretions on them.  Wear gloves when cleaning surfaces the patient has come in contact with.  Use a diluted bleach solution (e.g., dilute bleach with 1 part bleach and 10 parts water) or a household disinfectant with a label that says EPA-registered for coronaviruses. To make a bleach solution at home, add 1 tablespoon  of bleach to 1 quart (4 cups) of water. For a larger supply, add  cup of bleach to 1 gallon (16 cups) of water.  Read labels of cleaning products and follow recommendations provided on product labels. Labels contain instructions for safe and effective use of the cleaning product including precautions you should take when applying the product, such as wearing gloves or eye protection and making sure you have good ventilation during use of the product.  Remove gloves and wash hands immediately after cleaning.  Monitor yourself for signs and symptoms of illness Caregivers and household members are considered close contacts, should monitor their health, and will be asked to limit movement outside of the home to the extent possible. Follow the monitoring steps for close contacts listed on the symptom monitoring form.   ? If you have additional questions, contact your local health department or call the epidemiologist on call at [phone] (available 24/7). ? This guidance is subject to change. For the most up-to-date guidance from [facility], please refer to their website: [email or web address]

## 2019-05-08 NOTE — Progress Notes (Signed)
Virtual Visit via Telephone Note  I connected with Caitlyn Anderson on 05/08/19 at 11:30 AM EDT by telephone and verified that I am speaking with the correct person using two identifiers.  Location: Patient: Home  Provider: Office    I discussed the limitations, risks, security and privacy concerns of performing an evaluation and management service by telephone and the availability of in person appointments. I also discussed with the patient that there may be a patient responsible charge related to this service. The patient expressed understanding and agreed to proceed.   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 Roxan Hockey.  She has a chronic cough felt secondary to allergic rhinitis and reflux.  Follow-up bronchoscopy November 2019 showed widely patent trachea.  There was a small right anterior lateral area of scarring.  Small pulmonary nodules noted on CT less than 4 mm consistent with benign etiology  Today's televisit is for a follow up  Diagnosed COVID 19 at [facility] 04/30/19 . Caught from Father. Several members caught it .  Has Low grade fever ~99 , fatigue, loss sense of taste and smell. No dyspnea or cough . Minimal wheezing. Appetite is down some but eating okay .  Has been takign Tylenol, zinc , pepcid  and airborne. Pushed fluids .  Taking zyrtec and flonase.  She has asthma on Symbicort . Feels Asthma is doing okay . Has been doing better with less chronic cough . Hx os tracheal stricture with tracheal web s/p resection in 2019 . Denies stridor or barking cough .     Observations/Objective: -CT Chest 03/2018-Subglottic tracheal stenosis appears more prominent today than on prior study. Patient underwent laser resection of tracheal web in this region on 03/24/2018. 2. 4 mm right middle lobe pulmonary nodule. No follow-up needed if patient is low-risk  CT  Neck - Circumferential subglottic stenosis at the site of laser resection of a tracheal web, with an approximate area of 0.2 cm squared, significantly worse than the preoperative appearance. This correlates with the clinical findings of severe stridor.  05/08/2019 -speaks in full sentences with no obviously distress in voice  , no coughing or audible wheezing or stridor .    Assessment and Plan: Covid 19 viral infection appears to have mild symptoms around 8 days from positive test . Seems to be stable /mild at this time. No apparent asthma flare  Would continue supportive care advised on red flags to look out for and report  Continue self quarantine , patient education given   Plan  Patient Instructions  Tylenol as needed for fever Push fluids Continue on Symbicort 2 puffs twice daily Continue on Flonase daily  Continue on Zyrtec daily Activity as tolerated Albuterol as needed Delsym 2 teaspoons twice daily as needed for cough if develops  Follow up as planned and As needed   Please contact office for sooner follow up if symptoms do not improve or worsen or seek emergency care       Follow Up Instructions: Follow-up as planned and as needed   I discussed the assessment and treatment plan with the patient. The patient was provided an opportunity to ask questions and all were answered. The patient agreed with the plan and demonstrated an understanding of the instructions.   The patient was advised to call back or seek an in-person evaluation if the symptoms worsen or if the condition fails to  improve as anticipated.  I provided  22 minutes of non-face-to-face time during this encounter.   Rexene Edison, NP

## 2019-05-20 IMAGING — CT CT NECK W/ CM
4 of 8 series · 12 of 33 positions shown, 13 images · IV contrast (ISOVUE 300)
Comparison: None.

CLINICAL DATA: Chronic shortness of breath with exertion, wheezing.
Known tracheal stricture.

EXAM:
CT NECK WITH CONTRAST
TECHNIQUE: Multidetector CT imaging of the neck was performed using the
standard protocol following the bolus administration of intravenous
contrast.
CONTRAST:  80mL PCV5CN-LII IOPAMIDOL (PCV5CN-LII) INJECTION 61%

[Series 6: neck 2.0 i31s 2 · axial · 0.36mm/px · z∈[-193,-81]mm · 3 of 113 slices shown, 4 images]
[im 29/113  soft-tissue]
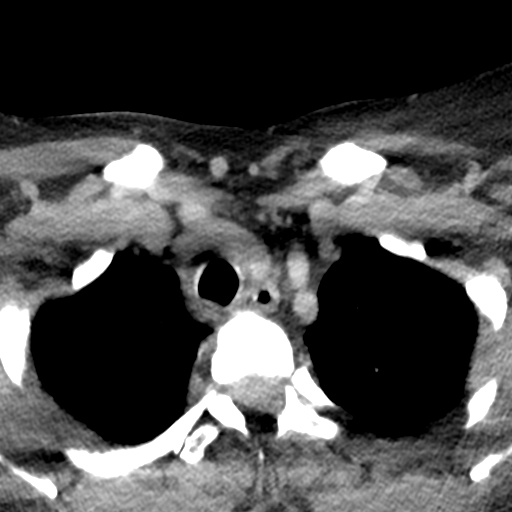
[im 29/113  bone]
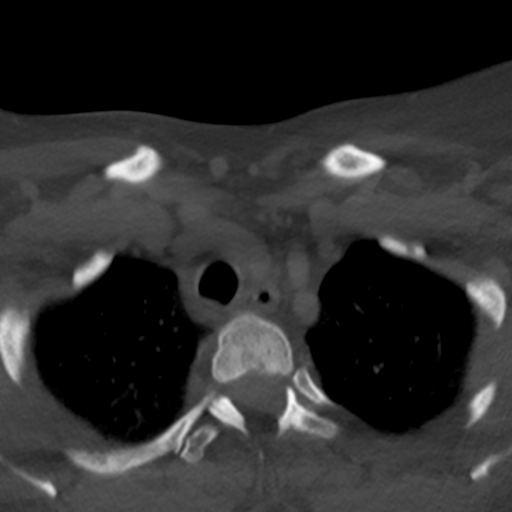
[im 57/113  bone]
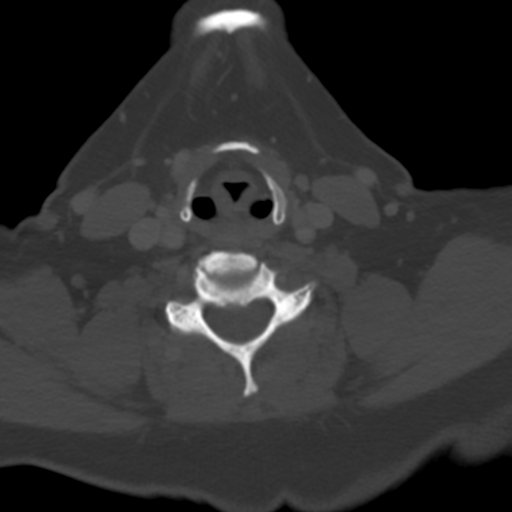
[im 85/113  bone]
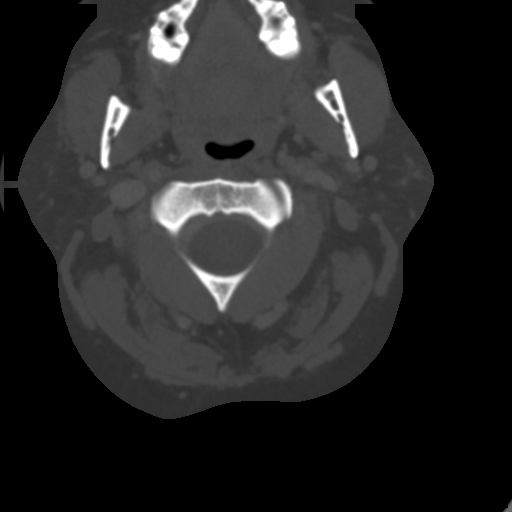

[Series 10: coronal · coronal · 0.54mm/px · 1 of 73 slices shown]
[im 37/73  bone]
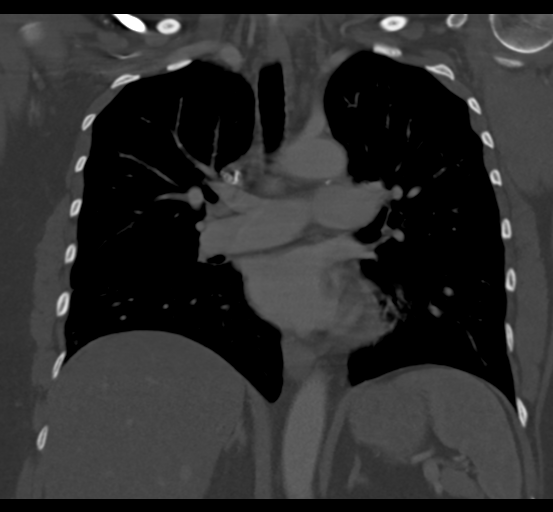

[Series 13: sagittal st · sagittal · 0.44mm/px · 5 of 143 slices shown]
[im 21/143  bone]
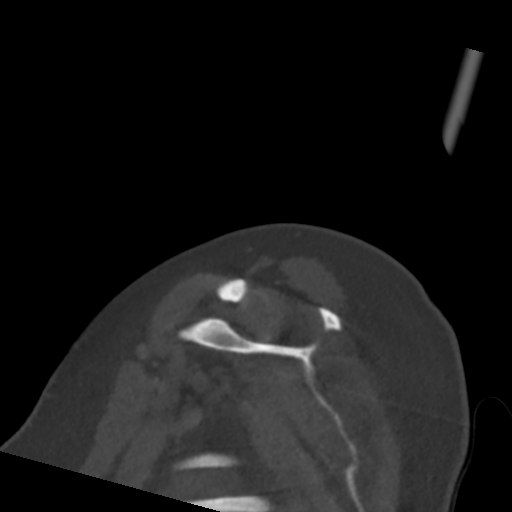
[im 41/143  bone]
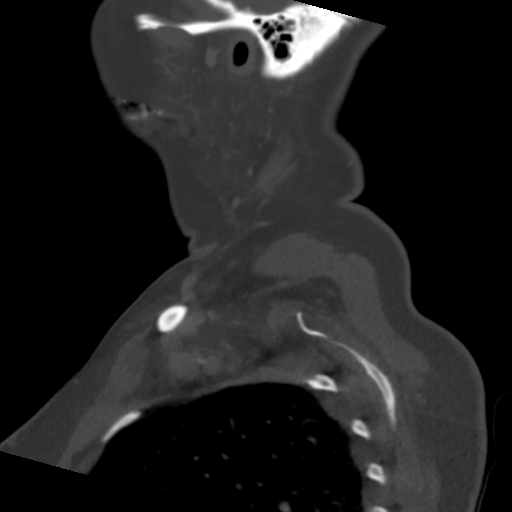
[im 61/143  bone]
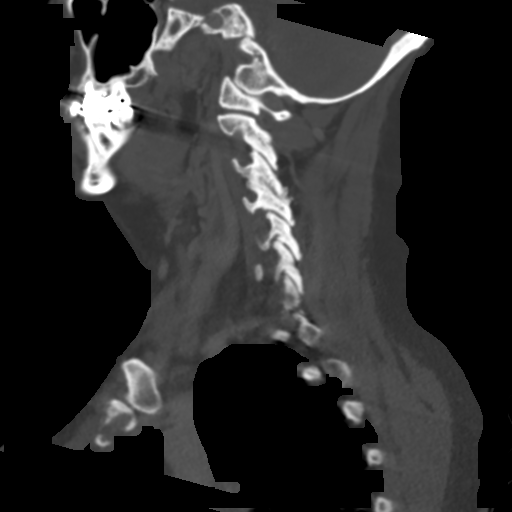
[im 82/143  bone]
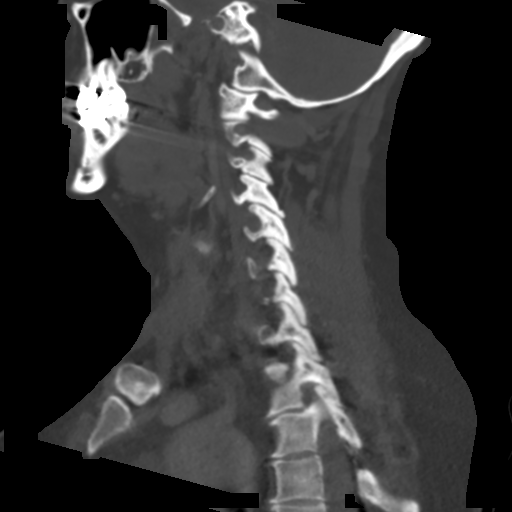
[im 102/143  bone]
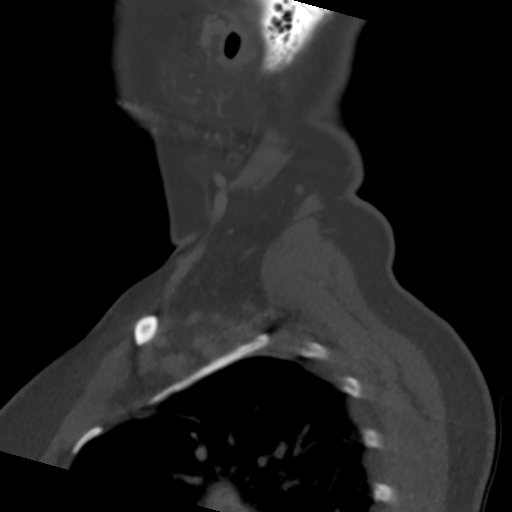

[Series 14: orthogonal st · axial · 0.39mm/px · z∈[-230,-121]mm · 3 of 117 slices shown]
[im 30/117  bone]
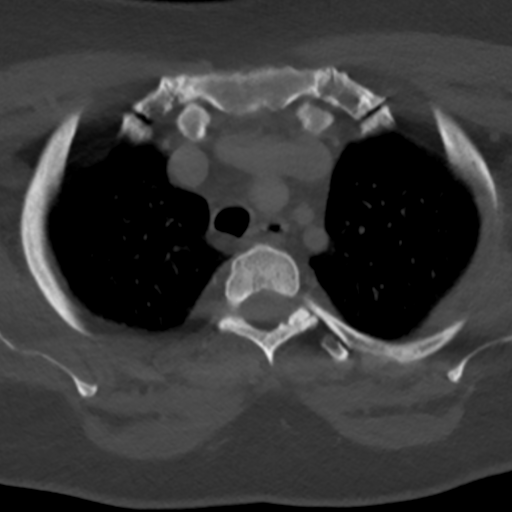
[im 59/117  bone]
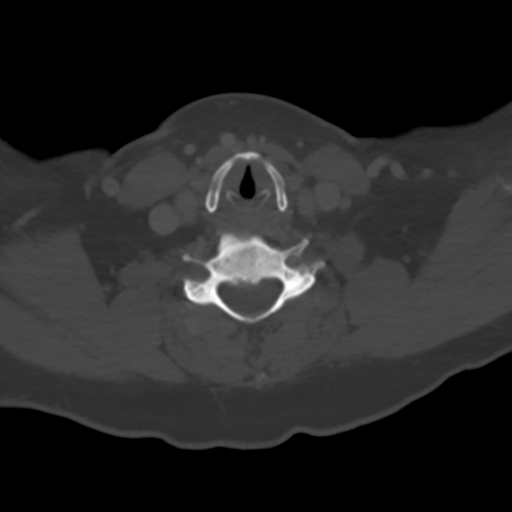
[im 88/117  bone]
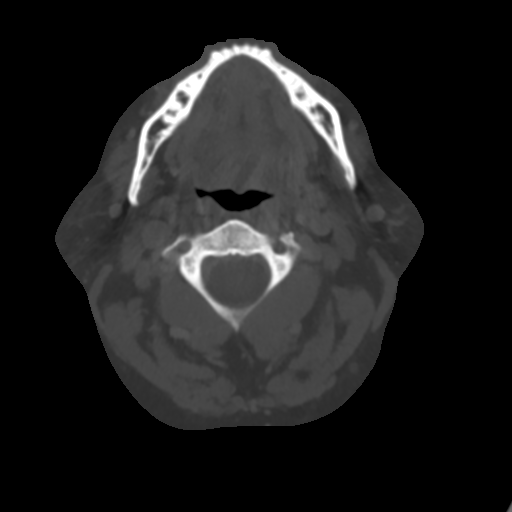

[12 of 33 positions shown; findings below may reference images not displayed]

FINDINGS: PHARYNX AND LARYNX: Asymmetrically prominent LEFT lingual tonsil
without discrete mass. Normal larynx. Linear soft tissue proximal
trachea, approximately 2 cm below the larynx with mural puckering.

SALIVARY GLANDS: A trophic submandibular glands. Fatty replaced
parotid glands.

THYROID: Normal.

LYMPH NODES: No lymphadenopathy by CT size criteria.

VASCULAR: Mild calcific atherosclerosis RIGHT carotid bifurcation.

LIMITED INTRACRANIAL: Normal.

VISUALIZED ORBITS: Normal.

MASTOIDS AND VISUALIZED PARANASAL SINUSES: Trace paranasal sinus
mucosal thickening without air-fluid levels. Minimal RIGHT mastoid
effusion.

SKELETON: Nonacute. Moderate to severe C5-6 and C6-7 degenerative
discs with multilevel moderate facet arthropathy. Severe RIGHT C3-4
facet arthropathy.

UPPER CHEST: Lung apices are clear. No superior mediastinal
lymphadenopathy.

OTHER: None.
IMPRESSION: 1. Linear soft tissue proximal trachea concerning for web.
2. Asymmetrically prominent LEFT lingual tonsil, recommend direct
inspection.

## 2020-05-25 ENCOUNTER — Other Ambulatory Visit: Payer: Self-pay | Admitting: *Deleted

## 2020-05-25 DIAGNOSIS — R059 Cough, unspecified: Secondary | ICD-10-CM

## 2020-05-25 DIAGNOSIS — J386 Stenosis of larynx: Secondary | ICD-10-CM

## 2020-05-25 DIAGNOSIS — R911 Solitary pulmonary nodule: Secondary | ICD-10-CM

## 2020-05-26 ENCOUNTER — Telehealth: Payer: Self-pay

## 2020-05-26 NOTE — Telephone Encounter (Signed)
-----   Message from Ned Clines sent at 05/25/2020  4:09 PM EST ----- Regarding: RE: Follow-up Got it all set up and patient is aware.  cindy ----- Message ----- From: Donnella Sham, RN Sent: 05/25/2020   9:18 AM EST To: Ned Clines Subject: FW: Follow-up                                  Thank you Loleta Chance ----- Message ----- From: Melrose Nakayama, MD Sent: 05/23/2020   6:49 PM EST To: Donnella Sham, RN Subject: RE: Follow-up                                  Yes She needs a CT of the neck and chest  [facility] ----- Message ----- From: Donnella Sham, RN Sent: 05/23/2020   2:41 PM EST To: Melrose Nakayama, MD Subject: Follow-up                                      Hey,   Patient called this afternoon requesting a follow-up appointment after her Bronchoscopy.  She stated that she was scheduled for an 6 month follow-up appointment back 01/2019 which she did not come to because of COVID.  Do you want to bring her in?  She did say that she is having more shortness of breath like before and is "afraid that it is growing back".  Please advise.  Thanks,  Caryl Pina

## 2020-06-13 ENCOUNTER — Other Ambulatory Visit: Payer: Self-pay | Admitting: Thoracic Surgery (Cardiothoracic Vascular Surgery)

## 2020-06-15 ENCOUNTER — Ambulatory Visit
Admission: RE | Admit: 2020-06-15 | Discharge: 2020-06-15 | Disposition: A | Discharge status: Home / Self Care | Payer: BC Managed Care – PPO | Source: Ambulatory Visit | Attending: Thoracic Surgery (Cardiothoracic Vascular Surgery) | Admitting: Thoracic Surgery (Cardiothoracic Vascular Surgery)

## 2020-06-15 ENCOUNTER — Other Ambulatory Visit: Payer: BC Managed Care – PPO

## 2020-06-15 ENCOUNTER — Other Ambulatory Visit: Payer: Self-pay

## 2020-06-15 DIAGNOSIS — J386 Stenosis of larynx: Secondary | ICD-10-CM

## 2020-06-15 DIAGNOSIS — R911 Solitary pulmonary nodule: Secondary | ICD-10-CM

## 2020-06-15 DIAGNOSIS — R059 Cough, unspecified: Secondary | ICD-10-CM

## 2020-06-15 MED ORDER — IOPAMIDOL (ISOVUE-300) INJECTION 61%
75.0000 mL | Freq: Once | INTRAVENOUS | Status: AC | PRN
  Administered 2020-06-15: 75 mL via INTRAVENOUS

## 2020-06-21 ENCOUNTER — Ambulatory Visit: Payer: BC Managed Care – PPO | Admitting: Thoracic Surgery (Cardiothoracic Vascular Surgery)

## 2020-06-21 ENCOUNTER — Other Ambulatory Visit: Payer: Self-pay

## 2020-06-21 ENCOUNTER — Encounter: Payer: Self-pay | Admitting: Thoracic Surgery (Cardiothoracic Vascular Surgery)

## 2020-06-21 VITALS — BP 176/91 | HR 94 | Temp 97.9°F | Resp 18 | Ht 60.0 in | Wt 194.0 lb

## 2020-06-21 DIAGNOSIS — J398 Other specified diseases of upper respiratory tract: Secondary | ICD-10-CM | POA: Diagnosis not present

## 2020-06-21 NOTE — Progress Notes (Signed)
[address]       [place] [address]             [phone]      HPI: Mrs. Devall returns for follow-up  Makailyn Mccormick is a 59 year old woman with a past history of a subglottic tracheal stenosis, asthma, hypertension, hyperlipidemia, type 2 diabetes, obesity, left bundle branch block, allergies, and hypothyroidism.  She was evaluated by Dr. Lamonte Sakai for her asthma.  PFT showed a fixed upper airway obstruction.  Bronchoscopy revealed an 80% stenosis in the proximal trachea.  I did a bronchoscopic laser ablation of a subglottic web on 03/24/2018.  There was a good post ablation result.  She presented back on postoperative day 5 with stridor.  She was treated with steroids and her symptoms improved.  I did a relook bronchoscopy on 05/19/2018.  It showed widely patent trachea.  She says that over the past several months she has had some worsening issues with her asthma.  She did have COVID-19 relatively recently but has recovered from that.  Past Medical History:  Diagnosis Date  . Allergic rhinitis   . Asthma   . 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 Dyspnea   . GERD (gastroesophageal reflux disease)   . Hyperlipidemia   . Hypertension   . Hypothyroidism   . Left bundle branch block   . Tracheal stenosis     Current Outpatient Medications  Medication Sig Dispense Refill  . albuterol (VENTOLIN HFA) 108 (90 Base) MCG/ACT inhaler Inhale 2 puffs into the lungs 4 (four) times daily as needed for wheezing or shortness of breath.     Marland Kitchen aspirin 81 MG chewable tablet Chew 1 tablet (81 mg total) by mouth 2 (two) times daily. 60 tablet 1  . blood glucose meter kit and supplies KIT Dispense based on patient and insurance preference. Use up to four times daily as directed. Dx: E11.9 1 each 0  . Carboxymethylcell-Hypromellose (GENTEAL OP) Place 1 drop into both eyes at bedtime as needed (for dry eyes).    . cetirizine (ZYRTEC) 10 MG tablet Take 10 mg by  mouth at bedtime.     . Cholecalciferol (VITAMIN D3) 50 MCG (2000 UT) TABS Take 2,000 Units by mouth daily.    Marland Kitchen docusate sodium (COLACE) 100 MG capsule Take 1 capsule (100 mg total) by mouth 2 (two) times daily. 60 capsule 1  . escitalopram (LEXAPRO) 10 MG tablet Take 10 mg by mouth daily.     . fluticasone (FLONASE) 50 MCG/ACT nasal spray Place 1-2 sprays into both nostrils daily as needed for allergies.     Marland Kitchen ibuprofen (ADVIL,MOTRIN) 200 MG tablet Take 600 mg by mouth every 6 (six) hours as needed for headache or moderate pain.    Marland Kitchen latanoprost (XALATAN) 0.005 % ophthalmic solution SMARTSIG:In Eye(s)    . levothyroxine (SYNTHROID, LEVOTHROID) 75 MCG tablet Take 75 mcg by mouth daily before breakfast.    . metFORMIN (GLUCOPHAGE) 1000 MG tablet Take 1,000 mg by mouth daily.     . SYMBICORT 160-4.5 MCG/ACT inhaler 1 puff 2 (two) times daily.    Marland Kitchen triamcinolone cream (KENALOG) 0.1 % APPLY TO AFFECTED AREA TWICE A DAY FOR ITCHY SKIN    . valsartan (DIOVAN) 160 MG tablet Take 1 tablet by mouth once daily 30 tablet 0  . atorvastatin (LIPITOR) 20 MG tablet Take 1 tablet (20 mg total) by mouth daily. 30 tablet 0   No  current facility-administered medications for this visit.    Physical Exam BP (!) 176/91 (BP Location: Left Arm, Patient Position: Sitting, Cuff Size: Normal)   Pulse 94   Temp 97.9 F (36.6 C) (Skin)   Resp 18   Ht 5' (1.524 m)   Wt 194 lb (88 kg)   SpO2 92% Comment: RA  BMI 37.63 kg/m  59 year old woman in no acute distress Alert and oriented x3 with no focal deficits Faint wheezing no definite stridor  Diagnostic Tests: CT NECK WITH CONTRAST  TECHNIQUE: Multidetector CT imaging of the neck was performed using the standard protocol following the bolus administration of intravenous contrast.  CONTRAST:  16m ISOVUE-300 IOPAMIDOL (ISOVUE-300) INJECTION 61%  COMPARISON:  03/29/2018.  02/19/2018.  FINDINGS: Pharynx and larynx: Narrowing of the subglottic trachea  at the location 2.5 cm inferior to the glottis with minimal diameter of 6 x 8 mm. Expected diameter in this location would be approximately 12 x 15 mm. Previous examination of 03/29/2018 showed a minimal diameter of 6.5 x 4.5 mm. No other mucosal or submucosal abnormality seen in the neck.  Salivary glands: Parotid and submandibular glands are normal.  Thyroid: Normal  Lymph nodes: No lymphadenopathy.  Vascular: Minimal atherosclerotic plaque at the carotid bifurcations without stenosis. No unusual vascular anatomy.  Limited intracranial: Negative  Visualized orbits: Not included  Mastoids and visualized paranasal sinuses: Clear  Skeleton: Ordinary mid cervical spondylosis C5-6 and C6-7.  Upper chest: See results of chest CT.  Other: None  IMPRESSION: Narrowing of the subglottic trachea at the location 2.5 cm inferior to the glottis with minimal diameter of 6 x 8 mm. Expected diameter in this location would be approximately 12 x 15 mm. Previous examination of 03/29/2018 showed a minimal diameter of 6.5 x 4.5 mm.   Electronically Signed   By: MNelson ChimesM.D.   On: 06/15/2020 12:35 I personally reviewed the CT images and concur with the findings noted above  Impression: LPrincess Karnesis a 59 year old woman with a past history of a subglottic tracheal stenosis, asthma, hypertension, hyperlipidemia, type 2 diabetes, obesity, left bundle branch block, allergies, and hypothyroidism.  She underwent a laser ablation of a tracheal stenosis about 2 years ago.  She now is having some increase in symptoms of asthma.  I do think she does have some asthma but is hard to tell how much of this is stridor due to the restenosis.  I suspect there is a significant component related to the restenosis.  We discussed 2 potential options.  The first would be a repeat laser ablation.  I do think she would be at high risk for restenosis.  Second option would be tracheal resection.   That would be definitive treatment and for more likely to yield a good long-term result.  Does have risks associated with the surgical procedure including aspiration and hoarseness.  Given the proximity to the cords I would want to have ENT involved.  Plan: She would like to think over her options.  She will return in 3 weeks to further discuss these issues  I spent 20 minutes in review of records, images, and discussion with LGeorgette Doverduring today's visit SMelrose Nakayama MD [facility] ([phone]

## 2020-07-12 ENCOUNTER — Other Ambulatory Visit: Payer: Self-pay

## 2020-07-12 ENCOUNTER — Ambulatory Visit: Payer: BC Managed Care – PPO | Admitting: Thoracic Surgery (Cardiothoracic Vascular Surgery)

## 2020-07-12 ENCOUNTER — Other Ambulatory Visit: Payer: Self-pay | Admitting: *Deleted

## 2020-07-12 VITALS — BP 136/82 | HR 89 | Resp 20 | Ht 60.0 in

## 2020-07-12 DIAGNOSIS — J398 Other specified diseases of upper respiratory tract: Secondary | ICD-10-CM | POA: Diagnosis not present

## 2020-07-12 DIAGNOSIS — J386 Stenosis of larynx: Secondary | ICD-10-CM

## 2020-07-12 NOTE — Progress Notes (Signed)
      [address]       [name] [address]             [phone]    Patient ID: Caitlyn Anderson, female   DOB: Sep 14, 1960, 60 y.o.   MRN: [id number]  Caitlyn Anderson returns with her husband to further discuss management of her subglottic stenosis.   Caitlyn Anderson is a 60 year old woman with a history of subglottic tracheal stenosis, asthma, hypertension, hyperlipidemia, type 2 diabetes, obesity, left bundle branch block, allergies, and hypothyroidism.  She was evaluated by Dr. Delton Coombes a couple of years ago for shortness of breath and asthma.  PFT showed a fixed upper airway obstruction.  Bronchoscopy showed an 80% stenosis in the proximal trachea.  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  This was about 2 cm below the cords.  She had a good result but presented back on postop day five with stridor.  She was treated with steroids and her symptoms resolved.  She then did well after that.  She missed her 1 year follow-up appointment due to COVID-19.  I saw her back on 06/21/2020.  It was 2 years since her procedure.  She did say she was having some issues with her asthma.  A CT of the neck showed some restenosis at the area.  We discussed possible repeat laser ablation versus surgical resection.  Given the proximity to the larynx I think that would be more an area for ENT.  She and her husband had a lot of questions regarding her options.  One issue is that it was 2 years between CTs we do not know exactly how open the airway ever was.  I recommended that we have her follow-up with Dr. Delton Coombes and repeat pulmonary function testing.  I want to make sure there is not some other cause of her recent symptoms before we do anything aggressive.  If no other cause was found I think the next step would be to repeat a bronchoscopy with possible laser ablation.  Depending on findings there may or may not recommend surgical resection.  I spent 15 minutes in review of images,  records, and in consultation with Caitlyn Anderson today. Salvatore Decent Dorris Fetch, MD [facility] [phone]

## 2020-08-04 ENCOUNTER — Ambulatory Visit: Payer: BC Managed Care – PPO | Admitting: Emergency Medicine

## 2020-08-15 ENCOUNTER — Other Ambulatory Visit: Payer: Self-pay

## 2020-08-15 ENCOUNTER — Other Ambulatory Visit (HOSPITAL_COMMUNITY)
Admission: RE | Admit: 2020-08-15 | Discharge: 2020-08-15 | Disposition: A | Discharge status: Home / Self Care | Payer: BC Managed Care – PPO | Source: Ambulatory Visit | Attending: Thoracic Surgery (Cardiothoracic Vascular Surgery) | Admitting: Thoracic Surgery (Cardiothoracic Vascular Surgery)

## 2020-08-15 DIAGNOSIS — Z01812 Encounter for preprocedural laboratory examination: Secondary | ICD-10-CM | POA: Insufficient documentation

## 2020-08-15 DIAGNOSIS — Z20822 Contact with and (suspected) exposure to covid-19: Secondary | ICD-10-CM | POA: Insufficient documentation

## 2020-08-15 LAB — SARS CORONAVIRUS 2 (TAT 6-24 HRS): SARS Coronavirus 2: NEGATIVE

## 2020-08-16 ENCOUNTER — Other Ambulatory Visit: Payer: Self-pay

## 2020-08-16 ENCOUNTER — Ambulatory Visit (HOSPITAL_COMMUNITY)
Admission: RE | Admit: 2020-08-16 | Discharge: 2020-08-16 | Disposition: A | Discharge status: Home / Self Care | Payer: BC Managed Care – PPO | Source: Ambulatory Visit | Attending: Thoracic Surgery (Cardiothoracic Vascular Surgery) | Admitting: Thoracic Surgery (Cardiothoracic Vascular Surgery)

## 2020-08-16 DIAGNOSIS — J386 Stenosis of larynx: Secondary | ICD-10-CM | POA: Insufficient documentation

## 2020-08-16 LAB — PULMONARY FUNCTION TEST
DL/VA % pred: 84 %
DL/VA: 3.68 ml/min/mmHg/L
DLCO unc % pred: 90 %
DLCO unc: 15.79 ml/min/mmHg
FEF 25-75 Post: 1.26 L/sec
FEF 25-75 Pre: 1.56 L/sec
FEF2575-%Change-Post: -19 %
FEF2575-%Pred-Post: 60 %
FEF2575-%Pred-Pre: 74 %
FEV1-%Change-Post: -18 %
FEV1-%Pred-Post: 68 %
FEV1-%Pred-Pre: 84 %
FEV1-Post: 1.47 L
FEV1-Pre: 1.82 L
FEV1FVC-%Change-Post: -18 %
FEV1FVC-%Pred-Pre: 83 %
FEV6-%Change-Post: -2 %
FEV6-%Pred-Post: 100 %
FEV6-%Pred-Pre: 102 %
FEV6-Post: 2.71 L
FEV6-Pre: 2.77 L
FEV6FVC-%Change-Post: 0 %
FEV6FVC-%Pred-Post: 103 %
FEV6FVC-%Pred-Pre: 103 %
FVC-%Change-Post: 0 %
FVC-%Pred-Post: 98 %
FVC-%Pred-Pre: 99 %
FVC-Post: 2.77 L
FVC-Pre: 2.78 L
Post FEV1/FVC ratio: 53 %
Post FEV6/FVC ratio: 100 %
Pre FEV1/FVC ratio: 65 %
Pre FEV6/FVC Ratio: 100 %
RV % pred: 160 %
RV: 2.9 L
TLC % pred: 131 %
TLC: 5.84 L

## 2020-08-16 MED ORDER — ALBUTEROL SULFATE (2.5 MG/3ML) 0.083% IN NEBU
2.5000 mg | INHALATION_SOLUTION | Freq: Once | RESPIRATORY_TRACT | Status: AC
  Administered 2020-08-16: 2.5 mg via RESPIRATORY_TRACT

## 2020-08-23 ENCOUNTER — Encounter: Payer: BC Managed Care – PPO | Admitting: Thoracic Surgery (Cardiothoracic Vascular Surgery)

## 2020-08-24 ENCOUNTER — Ambulatory Visit: Payer: BC Managed Care – PPO | Admitting: Emergency Medicine

## 2020-08-24 ENCOUNTER — Encounter: Payer: Self-pay | Admitting: Emergency Medicine

## 2020-08-24 ENCOUNTER — Other Ambulatory Visit: Payer: Self-pay

## 2020-08-24 DIAGNOSIS — J45909 Unspecified asthma, uncomplicated: Secondary | ICD-10-CM | POA: Insufficient documentation

## 2020-08-24 DIAGNOSIS — J398 Other specified diseases of upper respiratory tract: Secondary | ICD-10-CM

## 2020-08-24 DIAGNOSIS — J4520 Mild intermittent asthma, uncomplicated: Secondary | ICD-10-CM | POA: Diagnosis not present

## 2020-08-24 MED ORDER — PANTOPRAZOLE SODIUM 20 MG PO TBEC: 20.0000 mg | DELAYED_RELEASE_TABLET | Freq: Every day | ORAL | 1 refills | Status: DC

## 2020-08-24 NOTE — Assessment & Plan Note (Signed)
Her most recent spirometry and her CT of the neck would suggest recurrent intrathoracic obstruction.  She is following with Dr. Roxan Hockey.  She may need a repeat bronchoscopy to better characterize the degree of obstruction.  Given its proximity to the glottis she may need an ENT evaluation and procedure in order to address.  I will discuss with Dr. Roxan Hockey

## 2020-08-24 NOTE — Assessment & Plan Note (Signed)
Her pulmonary function testing does show mild obstruction but much more in a pattern of fixed intrathoracic abnormality based on her flow volume loop.  Unfortunately I did not check her spirometry immediately after her tracheal procedure, which would have helped [place] determine exactly how much lower airways obstruction was present.  Her pulmonary function testing now is almost identical to that which we obtained before she ever had her laser ablation.  That being said she is on good maintenance therapy, tolerate Symbicort and I think we can continue.  Albuterol does seem to sometimes help her.  I will also try to treat her GERD and rhinitis more effectively as these will both exacerbate asthma.

## 2020-08-24 NOTE — Progress Notes (Signed)
Subjective:    Patient ID: Caitlyn Anderson, female    DOB: Feb 18, 1961, 60 y.o.   MRN: [id number]  HPI  ROV 09/03/2018 --Caitlyn Anderson follows up today for her history of a tracheal web that was resected in September by Dr. Roxan Hockey.  She also has allergic rhinitis and reflux that we have felt may contribute to recurrent cough.  She had a repeat bronchoscopy in November that showed that her trachea was widely patent.  There was a small right anterior lateral area of scarring. She then had L hip sgy at the end of the year. She is currently getting over a URI. She continues to deal with allergies, throat clearing. She is on flonase prn, zyrtec. She has occasional GERD - she is no longer on a PPI. Has not needed her albuterol.   ROV 08/24/20 --Caitlyn Anderson is 36, follows up today for her history of dyspnea and obstructive disease.  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 Roxan Hockey in 02/2018.  Also with a history of hypertension, hyperlipidemia, diabetes.  She had COVID-19 in 04/2019.  She is on Symbicort 160/4.5 twice daily, has albuterol which she uses rarely.   She reports exertional SOB, feels OK at rest. Some cough. She has a lot of nasal congestion, bothers her year round. She is on flonase. She can hear some wheeze at time - seems to be random. She does a lot throat clearing. She has reflux but is off of PPI, uses tums about once a week. She is on singulair and zyrtec. Not on [place].   CT neck 06/15/2020 showed narrowing of the subglottic trachea but 2.5 cm inferior to the glottis, minimal diameter estimated at 6 to 8 mm.  It was apparently 6.5 x 4.5 mm before she had her original laser treatment.  Repeat pulmonary function testing done 08/16/2020 reviewed by me, shows mild obstructive airflow, hyperinflated volumes, normal diffusion capacity.  Her flow volume loop again has a pattern of fixed intrathoracic obstruction.     Review of Systems  Constitutional: Negative  for fever and unexpected weight change.  HENT: Positive for congestion, postnasal drip and rhinorrhea. Negative for dental problem, ear pain, nosebleeds, sinus pressure, sneezing, sore throat and trouble swallowing.   Eyes: Negative for redness and itching.  Respiratory: Positive for cough, chest tightness, shortness of breath and wheezing.   Cardiovascular: Negative for palpitations and leg swelling.  Gastrointestinal: Negative for nausea and vomiting.  Genitourinary: Negative for dysuria.  Musculoskeletal: Negative for joint swelling.  Skin: Negative for rash.  Allergic/Immunologic: Positive for environmental allergies. Negative for food allergies and immunocompromised state.  Neurological: Negative for headaches.  Hematological: Does not bruise/bleed easily.  Psychiatric/Behavioral: Negative for dysphoric mood. The patient is nervous/anxious.       Objective:   Physical Exam Vitals:   08/24/20 1451  BP: 128/82  Pulse: 92  Temp: (!) 97 F (36.1 C)  TempSrc: Temporal  SpO2: 98%  Weight: 191 lb 9.6 oz (86.9 kg)  Height: 5\' 1"  (1.549 m)   Gen: Pleasant, overwt woman, in no distress,  normal affect  ENT: No lesions,  mouth clear,  oropharynx clear, no postnasal drip  Neck: No JVD, clear no stridor  Lungs: No use of accessory muscles, no stridor, no referred noise  Cardiovascular: RRR, heart sounds normal, no murmur or gallops, trace peripheral edema  Musculoskeletal: No deformities, no cyanosis or clubbing  Neuro: alert, non focal  Skin: Warm, no lesions or  rash      Assessment & Plan:  Asthma Her pulmonary function testing does show mild obstruction but much more in a pattern of fixed intrathoracic abnormality based on her flow volume loop.  Unfortunately I did not check her spirometry immediately after her tracheal procedure, which would have helped [place] determine exactly how much lower airways obstruction was present.  Her pulmonary function testing now is almost  identical to that which we obtained before she ever had her laser ablation.  That being said she is on good maintenance therapy, tolerate Symbicort and I think we can continue.  Albuterol does seem to sometimes help her.  I will also try to treat her GERD and rhinitis more effectively as these will both exacerbate asthma.  Tracheal stenosis Her most recent spirometry and her CT of the neck would suggest recurrent intrathoracic obstruction.  She is following with Dr. Roxan Hockey.  She may need a repeat bronchoscopy to better characterize the degree of obstruction.  Given its proximity to the glottis she may need an ENT evaluation and procedure in order to address.  I will discuss with Dr. Ysidro Evert, MD, PhD 08/24/2020, 5:41 PM [facility] [phone] or if no answer [phone]

## 2020-08-24 NOTE — Patient Instructions (Signed)
Please continue your Symbicort 2 puffs twice a day.  Rinse and gargle after using. Keep albuterol available to use 2 puffs when you need if shortness of breath, chest tightness, wheezing. Continue your fluticasone nasal spray, Singulair and Zyrtec as you are using them. Try starting nasal saline rinses once daily Restart pantoprazole 40 mg once daily until next visit.  Take this medication 1 hour around food. We will discuss your pulmonary function testing and your CT scan with Dr. Roxan Hockey in order to plan next steps in your evaluation. Follow with Dr Lamonte Sakai in 1 month or next available

## 2020-08-30 ENCOUNTER — Other Ambulatory Visit: Payer: Self-pay

## 2020-08-30 ENCOUNTER — Ambulatory Visit (INDEPENDENT_AMBULATORY_CARE_PROVIDER_SITE_OTHER): Payer: BC Managed Care – PPO | Admitting: Thoracic Surgery (Cardiothoracic Vascular Surgery)

## 2020-08-30 ENCOUNTER — Encounter: Payer: Self-pay | Admitting: Thoracic Surgery (Cardiothoracic Vascular Surgery)

## 2020-08-30 VITALS — BP 129/88 | HR 80 | Resp 20 | Ht 61.0 in

## 2020-08-30 DIAGNOSIS — J398 Other specified diseases of upper respiratory tract: Secondary | ICD-10-CM | POA: Diagnosis not present

## 2020-08-30 NOTE — Progress Notes (Signed)
      [address]       ,[place] [address]             [phone]    Patient ID: Caitlyn Anderson, female   DOB: 02/26/1961, 60 y.o.   MRN: [id number]   [name] returns for follow-up of her subglottic stenosis.  Caitlyn Anderson is a 60 year old woman with a history of a subglottic tracheal stenosis status post laser ablation, asthma, hypertension, hyperlipidemia, type 2 diabetes, left bundle branch block, allergies, and hypothyroidism.  She initially was evaluated by Dr. Lamonte Sakai for shortness of breath and wheezing.  He found a fixed upper airway obstruction.  She had about an 80% narrowing in her proximal trachea.  I did a laser ablation of that on 03/24/2018.  Was about 2 cm below the cords.  She presented back on postoperative day 5 with stridor requiring steroids.  She did not did well after that until recently.  I saw her back in December 2021 and she was having some recurrent stridor.  A CT of the neck showed some restenosis in the area although it was not as tight as it had been originally.  In the interim she saw Dr. Lamonte Sakai again.  He did repeat PFTs and they again showed a fixed airway obstruction.  She continues to have stridor.  She gets short of breath with heavy exertion.    Physical exam BP 129/88 (BP Location: Right Arm, Patient Position: Sitting)   Pulse 80   Resp 20   Ht 5\' 1"  (1.549 m)   SpO2 94% Comment: RA  BMI 36.48 kg/m   60 year old woman in no acute distress Positive stridor and wheezing bilaterally  Impression: Recurrent subglottic stenosis.  Plan: I discussed this case with Dr. Arville Care of otorhinolaryngology.  He has dealt with this issue previously.  Usually a combination of balloon dilatation and laser ablation can manage this.  I am going to refer her to to him for a consultation.  Revonda Standard Roxan Hockey, MD [facility] [phone]

## 2020-09-26 ENCOUNTER — Encounter: Payer: Self-pay | Admitting: Emergency Medicine

## 2020-09-26 ENCOUNTER — Ambulatory Visit: Payer: BC Managed Care – PPO | Admitting: Emergency Medicine

## 2020-09-26 ENCOUNTER — Other Ambulatory Visit: Payer: Self-pay

## 2020-09-26 DIAGNOSIS — J4520 Mild intermittent asthma, uncomplicated: Secondary | ICD-10-CM | POA: Diagnosis not present

## 2020-09-26 DIAGNOSIS — J398 Other specified diseases of upper respiratory tract: Secondary | ICD-10-CM | POA: Diagnosis not present

## 2020-09-26 NOTE — Progress Notes (Signed)
Subjective:    Patient ID: Caitlyn Anderson, female    DOB: 10-06-1960, 60 y.o.   MRN: [id number]  HPI  ROV 09/03/2018 --Caitlyn Anderson follows up today for her history of a tracheal web that was resected in September by Dr. Roxan Hockey.  She also has allergic rhinitis and reflux that we have felt may contribute to recurrent cough.  She had a repeat bronchoscopy in November that showed that her trachea was widely patent.  There was a small right anterior lateral area of scarring. She then had L hip sgy at the end of the year. She is currently getting over a URI. She continues to deal with allergies, throat clearing. She is on flonase prn, zyrtec. She has occasional GERD - she is no longer on a PPI. Has not needed her albuterol.   ROV 08/24/20 --Caitlyn Anderson is 41, follows up today for her history of dyspnea and obstructive disease.  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 Roxan Hockey in 02/2018.  Also with a history of hypertension, hyperlipidemia, diabetes.  She had COVID-19 in 04/2019.  She is on Symbicort 160/4.5 twice daily, has albuterol which she uses rarely.   She reports exertional SOB, feels OK at rest. Some cough. She has a lot of nasal congestion, bothers her year round. She is on flonase. She can hear some wheeze at time - seems to be random. She does a lot throat clearing. She has reflux but is off of PPI, uses tums about once a week. She is on singulair and zyrtec. Not on [place].   CT neck 06/15/2020 showed narrowing of the subglottic trachea but 2.5 cm inferior to the glottis, minimal diameter estimated at 6 to 8 mm.  It was apparently 6.5 x 4.5 mm before she had her original laser treatment.  Repeat pulmonary function testing done 08/16/2020 reviewed by me, shows mild obstructive airflow, hyperinflated volumes, normal diffusion capacity.  Her flow volume loop again has a pattern of fixed intrathoracic obstruction.  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 Roxan Hockey in August 2019.  Unfortunately there is been some evidence for recurrent narrowing both clinically and by her spirometry, CT neck from 06/15/2020. She tells me that some of her breathing is better, her GERD is better since we started PPI last time. Her nose is stuffed and congested. She is on zyrtec and floanse. She used to be on immunotherapy years ago. She is planning to see Dr Constance Holster tomorrow.    MDM: Reviewed Dr. Leonarda Salon notes 08/30/2020   Review of Systems  Constitutional: Negative for fever and unexpected weight change.  HENT: Positive for congestion, postnasal drip and rhinorrhea. Negative for dental problem, ear pain, nosebleeds, sinus pressure, sneezing, sore throat and trouble swallowing.   Eyes: Negative for redness and itching.  Respiratory: Positive for cough, chest tightness, shortness of breath and wheezing.   Cardiovascular: Negative for palpitations and leg swelling.  Gastrointestinal: Negative for nausea and vomiting.  Genitourinary: Negative for dysuria.  Musculoskeletal: Negative for joint swelling.  Skin: Negative for rash.  Allergic/Immunologic: Positive for environmental allergies. Negative for food allergies and immunocompromised state.  Neurological: Negative for headaches.  Hematological: Does not bruise/bleed easily.  Psychiatric/Behavioral: Negative for dysphoric mood. The patient is nervous/anxious.       Objective:   Physical Exam Vitals:   09/26/20 1349  BP: 124/76  Pulse: 91  Temp: Marland Kitchen)  97.4 F (36.3 C)  TempSrc: Temporal  SpO2: 97%  Weight: 191 lb (86.6 kg)  Height: 5' (1.524 m)   Gen: Pleasant, overwt woman, in no distress,  normal affect  ENT: No lesions,  mouth clear,  oropharynx clear, no postnasal drip  Neck: No JVD, mild low pitched UA noise on insp and ext  Lungs: No use of accessory muscles, no stridor, no referred  noise  Cardiovascular: RRR, heart sounds normal, no murmur or gallops, trace peripheral edema  Musculoskeletal: No deformities, no cyanosis or clubbing  Neuro: alert, non focal  Skin: Warm, no lesions or rash      Assessment & Plan:  Tracheal stenosis She still has upper airway noise, increased dyspnea.  Her CT scan of her spirometry are both consistent with some recurrence of her tracheal web.  She is planning to see Dr. Constance Holster on 3/22, suspect she will need visualization, unclear what interventions would be beneficial.  She did get significant benefit after her original laser treatment by Dr. Roxan Hockey in August 2019.  Nonspecific upper airway irritants conclude her rhinitis, GERD and will work on treating both more aggressively.  Agree with following with Dr. Constance Holster on 3/22 as planned.  Suspect that there is still upper airway blockage that is contributing to your breathing difficulty Please continue your pantoprazole as you have been taking it Please continue Zyrtec and fluticasone nasal spray as you have been using them. Try restarting nasal saline rinses (Nettie pot) once daily We may need to consider referring you back to allergy for allergy shots. Follow with Dr. Lamonte Sakai in 2 months or sooner if you have any problems.   Asthma Unclear how much asthma is actually present but she does feel that she gets some benefit from albuterol so we will continue it as needed  Baltazar Apo, MD, PhD 09/26/2020, 2:14 PM [facility] [phone] or if no answer [phone]

## 2020-09-26 NOTE — Patient Instructions (Addendum)
Agree with following with Dr. Constance Holster on 3/22 as planned.  Suspect that there is still upper airway blockage that is contributing to your breathing difficulty Please continue your pantoprazole as you have been taking it Please continue Zyrtec and fluticasone nasal spray as you have been using them. Try restarting nasal saline rinses (Nettie pot) once daily We may need to consider referring you back to allergy for allergy shots. Okay to keep albuterol available to use 2 puffs if needed for shortness of breath Follow with Dr. Lamonte Sakai in 2 months or sooner if you have any problems.

## 2020-09-26 NOTE — Assessment & Plan Note (Addendum)
She still has upper airway noise, increased dyspnea.  Her CT scan of her spirometry are both consistent with some recurrence of her tracheal web.  She is planning to see Dr. Constance Holster on 3/22, suspect she will need visualization, unclear what interventions would be beneficial.  She did get significant benefit after her original laser treatment by Dr. Roxan Hockey in August 2019.  Nonspecific upper airway irritants conclude her rhinitis, GERD and will work on treating both more aggressively.  Agree with following with Dr. Constance Holster on 3/22 as planned.  Suspect that there is still upper airway blockage that is contributing to your breathing difficulty Please continue your pantoprazole as you have been taking it Please continue Zyrtec and fluticasone nasal spray as you have been using them. Try restarting nasal saline rinses (Nettie pot) once daily We may need to consider referring you back to allergy for allergy shots. Follow with Dr. Lamonte Sakai in 2 months or sooner if you have any problems.

## 2020-09-26 NOTE — Assessment & Plan Note (Signed)
Unclear how much asthma is actually present but she does feel that she gets some benefit from albuterol so we will continue it as needed

## 2020-09-27 ENCOUNTER — Encounter: Payer: BC Managed Care – PPO | Admitting: Thoracic Surgery (Cardiothoracic Vascular Surgery)

## 2020-10-11 ENCOUNTER — Encounter: Payer: BC Managed Care – PPO | Admitting: Thoracic Surgery (Cardiothoracic Vascular Surgery)

## 2020-10-18 ENCOUNTER — Encounter: Payer: BC Managed Care – PPO | Admitting: Thoracic Surgery (Cardiothoracic Vascular Surgery)

## 2020-10-20 NOTE — H&P (Signed)
HPI:   Caitlyn Anderson is a 60 y.o. female who presents as a return Patient.   Current problem: Subglottic stenosis.  HPI: Return visit. She was treated by Dr. Roxan Hockey a couple of years ago with laser bronchoscopy and dilation. She had some problems with her breathing postop and ended up in the ICU for a little bit of time. Since then she has done well until a few months ago when she started being coming symptomatic again. She is having noisy breathing and shortness of breath. She also suffers with chronic heartburn. She drinks a significant amount of caffeine on a daily basis. She has been on PPI therapy for about a month. She had a CT recently which revealed a severe stenosis about 2.5 cm below the glottis. No history of intubation.  PMH/Meds/All/SocHx/FamHx/ROS:   Past Medical History:  Diagnosis Date  . Allergic rhinitis  . Hyperlipidemia  . Hypertension  . Left bundle branch block   Past Surgical History:  Procedure Laterality Date  . BREAST SURGERY  . CESAREAN SECTION  . DILATION AND CURETTAGE OF UTERUS  . TONSILLECTOMY  . Video bronchoscopy   No family history of bleeding disorders, wound healing problems or difficulty with anesthesia.   Social History   Socioeconomic History  . Marital status: Married  Spouse name: Not on file  . Number of children: Not on file  . Years of education: Not on file  . Highest education level: Not on file  Occupational History  . Not on file  Tobacco Use  . Smoking status: Never Smoker  . Smokeless tobacco: Never Used  Vaping Use  . Vaping Use: Never used  Substance and Sexual Activity  . Alcohol use: No  . Drug use: Not on file  . Sexual activity: Not on file  Other Topics Concern  . Not on file  Social History Narrative  . Not on file   Social Determinants of Health   Financial Resource Strain: Not on file  Food Insecurity: Not on file  Transportation Needs: Not on file  Physical Activity: Not on file  Stress: Not on file   Social Connections: Not on file  Housing Stability: Not on file   Current Outpatient Medications:  . albuterol 90 mcg/actuation inhaler, Inhale into the lungs., Disp: , Rfl:  . aspirin 81 MG chewable tablet *ANTIPLATELET*, Take 81 mg by mouth., Disp: , Rfl:  . atorvastatin (LIPITOR) 20 MG tablet, Take 20 mg by mouth daily., Disp: , Rfl:  . budesonide-formoterol (SYMBICORT) 160-4.5 mcg/actuation inhaler, Symbicort 160 mcg-4.5 mcg/actuation HFA aerosol inhaler, Disp: , Rfl:  . cetirizine (ZYRTEC) 10 MG tablet, Take 10 mg by mouth., Disp: , Rfl:  . CHEMOTHERAPY NON FORMULARY, xiidra 5 % soln, Disp: , Rfl:  . cholecalciferol, vitamin D3, 1,000 unit capsule, Take 1 capsule by mouth., Disp: , Rfl:  . erythromycin 5 mg/gram (0.5 %) ophthalmic ointment, INSTILL OINTMENT TO AFFECTED AREA INTO EACH EYE THREE TIMES DAILY, Disp: , Rfl:  . escitalopram oxalate (LEXAPRO) 10 MG tablet, Take 1 capsule by mouth., Disp: , Rfl:  . fluticasone propionate (FLONASE) 50 mcg/actuation nasal spray, Inhale 1 spray into the lungs., Disp: , Rfl:  . JANUVIA 100 mg tablet, Take 100 mg by mouth daily., Disp: , Rfl:  . latanoprost (XALATAN) 0.005 % ophthalmic solution, INSTILL 1 DROP INTO EACH EYE AT BEDTIME, Disp: , Rfl:  . levothyroxine (SYNTHROID, LEVOTHROID) 75 MCG tablet, Take 75 mcg by mouth., Disp: , Rfl:  . metFORMIN (GLUCOPHAGE) 1000 MG tablet, Take  1 capsule by mouth., Disp: , Rfl:  . montelukast (SINGULAIR) 10 mg tablet, Take 10 mg by mouth daily., Disp: , Rfl:  . pantoprazole (PROTONIX) 20 MG tablet, Take 20 mg by mouth daily., Disp: , Rfl:  . prednisoLONE acetate (PRED FORTE) 1 % ophthalmic suspension, INSTILL 1 DROP INTO EACH EYE 4 TIMES DAILY, Disp: , Rfl:  . valsartan (DIOVAN) 160 MG tablet, Take 160 mg by mouth., Disp: , Rfl:    Physical Exam:   Healthy-appearing lady in no distress. She does have biphasic noisy breathing with mild stridor. She is in no respiratory distress. Oral cavity and pharynx are  healthy and clear. Indirect exam of the larynx reveals healthy normal vocal cords. I cannot visualize the subglottis. Upper airway down to the level of the cords is completely normal. No palpable neck masses.  Independent Review of Additional Tests or Records:  none  Procedures:  none  Impression & Plans:  Idiopathic subglottic stenosis. Recommend laser laryngoscopy with balloon dilation. This is felt to be caused by chronic reflux. Recommend complete avoidance of all caffeine. Recommend stay on PPI for now and may be at some point we can transition to H2 blocker therapy which may be safer in the long run. She understands and agrees.

## 2020-10-25 ENCOUNTER — Other Ambulatory Visit (HOSPITAL_COMMUNITY)
Admission: RE | Admit: 2020-10-25 | Discharge: 2020-10-25 | Disposition: A | Discharge status: Home / Self Care | Payer: BC Managed Care – PPO | Source: Ambulatory Visit | Attending: Otolaryngology | Admitting: Otolaryngology

## 2020-10-25 ENCOUNTER — Other Ambulatory Visit: Payer: Self-pay | Admitting: Emergency Medicine

## 2020-10-25 DIAGNOSIS — Z01812 Encounter for preprocedural laboratory examination: Secondary | ICD-10-CM | POA: Diagnosis present

## 2020-10-25 DIAGNOSIS — Z20822 Contact with and (suspected) exposure to covid-19: Secondary | ICD-10-CM | POA: Diagnosis not present

## 2020-10-25 LAB — SARS CORONAVIRUS 2 (TAT 6-24 HRS): SARS Coronavirus 2: NEGATIVE

## 2020-10-27 NOTE — Progress Notes (Signed)
Called pt and she was having dinner. I told her that I would be glad to call her back and leave instructions on her voicemail. She stated that she would appreciate that I would do that.   Pre-op instructions left on voicemail.   Covid test done 10/25/20 and it's negative.

## 2020-10-28 ENCOUNTER — Ambulatory Visit (HOSPITAL_COMMUNITY): Payer: BC Managed Care – PPO | Admitting: Certified Registered Nurse Anesthetist

## 2020-10-28 ENCOUNTER — Other Ambulatory Visit: Payer: Self-pay

## 2020-10-28 ENCOUNTER — Ambulatory Visit (HOSPITAL_COMMUNITY)
Admission: RE | Admit: 2020-10-28 | Discharge: 2020-10-28 | Disposition: A | Discharge status: Home / Self Care | Payer: BC Managed Care – PPO | Attending: Otolaryngology | Admitting: Otolaryngology

## 2020-10-28 ENCOUNTER — Encounter (HOSPITAL_COMMUNITY)
Admission: RE | Disposition: A | Discharge status: Home / Self Care | Payer: Self-pay | Source: Home / Self Care | Attending: Otolaryngology

## 2020-10-28 DIAGNOSIS — J386 Stenosis of larynx: Secondary | ICD-10-CM | POA: Diagnosis present

## 2020-10-28 DIAGNOSIS — K219 Gastro-esophageal reflux disease without esophagitis: Secondary | ICD-10-CM | POA: Insufficient documentation

## 2020-10-28 DIAGNOSIS — Z7984 Long term (current) use of oral hypoglycemic drugs: Secondary | ICD-10-CM | POA: Insufficient documentation

## 2020-10-28 DIAGNOSIS — Z79890 Hormone replacement therapy (postmenopausal): Secondary | ICD-10-CM | POA: Insufficient documentation

## 2020-10-28 DIAGNOSIS — Z79899 Other long term (current) drug therapy: Secondary | ICD-10-CM | POA: Diagnosis not present

## 2020-10-28 DIAGNOSIS — Z7951 Long term (current) use of inhaled steroids: Secondary | ICD-10-CM | POA: Diagnosis not present

## 2020-10-28 DIAGNOSIS — Z7982 Long term (current) use of aspirin: Secondary | ICD-10-CM | POA: Insufficient documentation

## 2020-10-28 HISTORY — PX: MICROLARYNGOSCOPY: SHX5208

## 2020-10-28 LAB — GLUCOSE, CAPILLARY
Glucose-Capillary: 125 mg/dL — ABNORMAL HIGH (ref 70–99)
Glucose-Capillary: 129 mg/dL — ABNORMAL HIGH (ref 70–99)

## 2020-10-28 SURGERY — MICROLARYNGOSCOPY
Anesthesia: General | Site: Throat

## 2020-10-28 MED ORDER — ONDANSETRON HCL 4 MG/2ML IJ SOLN
INTRAMUSCULAR | Status: DC | PRN
  Administered 2020-10-28: 4 mg via INTRAVENOUS

## 2020-10-28 MED ORDER — OXYCODONE HCL 5 MG/5ML PO SOLN: 5.0000 mg | Freq: Once | ORAL | Status: DC | PRN

## 2020-10-28 MED ORDER — ORAL CARE MOUTH RINSE: 15.0000 mL | Freq: Once | OROMUCOSAL | Status: AC

## 2020-10-28 MED ORDER — OXYCODONE HCL 5 MG PO TABS: 5.0000 mg | ORAL_TABLET | Freq: Once | ORAL | Status: DC | PRN

## 2020-10-28 MED ORDER — SUGAMMADEX SODIUM 200 MG/2ML IV SOLN
INTRAVENOUS | Status: DC | PRN
  Administered 2020-10-28: 200 mg via INTRAVENOUS

## 2020-10-28 MED ORDER — ONDANSETRON HCL 4 MG/2ML IJ SOLN: 4.0000 mg | Freq: Once | INTRAMUSCULAR | Status: DC | PRN

## 2020-10-28 MED ORDER — FENTANYL CITRATE (PF) 250 MCG/5ML IJ SOLN
INTRAMUSCULAR | Status: AC
  Filled 2020-10-28: qty 5

## 2020-10-28 MED ORDER — LIDOCAINE 2% (20 MG/ML) 5 ML SYRINGE
INTRAMUSCULAR | Status: DC | PRN
  Administered 2020-10-28: 80 mg via INTRAVENOUS

## 2020-10-28 MED ORDER — AMISULPRIDE (ANTIEMETIC) 5 MG/2ML IV SOLN: 10.0000 mg | Freq: Once | INTRAVENOUS | Status: DC | PRN

## 2020-10-28 MED ORDER — CHLORHEXIDINE GLUCONATE 0.12 % MT SOLN
OROMUCOSAL | Status: AC
  Administered 2020-10-28: 15 mL via OROMUCOSAL
  Filled 2020-10-28: qty 15

## 2020-10-28 MED ORDER — PROPOFOL 10 MG/ML IV BOLUS
INTRAVENOUS | Status: DC | PRN
  Administered 2020-10-28: 150 mg via INTRAVENOUS

## 2020-10-28 MED ORDER — CHLORHEXIDINE GLUCONATE 0.12 % MT SOLN: 15.0000 mL | Freq: Once | OROMUCOSAL | Status: AC

## 2020-10-28 MED ORDER — PROPOFOL 500 MG/50ML IV EMUL
INTRAVENOUS | Status: DC | PRN
  Administered 2020-10-28: 100 ug/kg/min via INTRAVENOUS

## 2020-10-28 MED ORDER — FENTANYL CITRATE (PF) 250 MCG/5ML IJ SOLN
INTRAMUSCULAR | Status: DC | PRN
  Administered 2020-10-28 (×2): 50 ug via INTRAVENOUS

## 2020-10-28 MED ORDER — MIDAZOLAM HCL 2 MG/2ML IJ SOLN
INTRAMUSCULAR | Status: AC
  Filled 2020-10-28: qty 2

## 2020-10-28 MED ORDER — DEXAMETHASONE SODIUM PHOSPHATE 10 MG/ML IJ SOLN
INTRAMUSCULAR | Status: DC | PRN
  Administered 2020-10-28: 10 mg via INTRAVENOUS

## 2020-10-28 MED ORDER — LIDOCAINE-EPINEPHRINE 1 %-1:100000 IJ SOLN
INTRAMUSCULAR | Status: AC
  Filled 2020-10-28: qty 1

## 2020-10-28 MED ORDER — LACTATED RINGERS IV SOLN: INTRAVENOUS | Status: DC

## 2020-10-28 MED ORDER — LACTATED RINGERS IV SOLN: INTRAVENOUS | Status: DC | PRN

## 2020-10-28 MED ORDER — MIDAZOLAM HCL 5 MG/5ML IJ SOLN
INTRAMUSCULAR | Status: DC | PRN
  Administered 2020-10-28: 1 mg via INTRAVENOUS

## 2020-10-28 MED ORDER — ROCURONIUM BROMIDE 100 MG/10ML IV SOLN
INTRAVENOUS | Status: DC | PRN
  Administered 2020-10-28: 50 mg via INTRAVENOUS
  Administered 2020-10-28: 20 mg via INTRAVENOUS

## 2020-10-28 MED ORDER — CLINDAMYCIN HCL 300 MG PO CAPS: 300.0000 mg | ORAL_CAPSULE | Freq: Three times a day (TID) | ORAL | 0 refills | Status: DC

## 2020-10-28 MED ORDER — FENTANYL CITRATE (PF) 100 MCG/2ML IJ SOLN: 25.0000 ug | INTRAMUSCULAR | Status: DC | PRN

## 2020-10-28 MED ORDER — EPINEPHRINE HCL (NASAL) 0.1 % NA SOLN
NASAL | Status: AC
  Filled 2020-10-28: qty 30

## 2020-10-28 MED ORDER — CLINDAMYCIN PHOSPHATE 900 MG/50ML IV SOLN
900.0000 mg | INTRAVENOUS | Status: AC
  Administered 2020-10-28: 900 mg via INTRAVENOUS
  Filled 2020-10-28: qty 50

## 2020-10-28 SURGICAL SUPPLY — 33 items
BALLN PULM 12 13.5 15X75 (BALLOONS) ×2
BALLOON PULM 12 13.5 15X75 (BALLOONS) ×1 IMPLANT
BNDG EYE OVAL (GAUZE/BANDAGES/DRESSINGS) ×4 IMPLANT
CANISTER SUCT 3000ML PPV (MISCELLANEOUS) ×2 IMPLANT
CNTNR URN SCR LID CUP LEK RST (MISCELLANEOUS) ×2 IMPLANT
CONT SPEC 4OZ STRL OR WHT (MISCELLANEOUS) ×2
COVER BACK TABLE 60X90IN (DRAPES) ×2 IMPLANT
COVER MAYO STAND STRL (DRAPES) ×2 IMPLANT
COVER WAND RF STERILE (DRAPES) ×2 IMPLANT
DRAPE HALF SHEET 40X57 (DRAPES) ×2 IMPLANT
DRSG TELFA 3X8 NADH (GAUZE/BANDAGES/DRESSINGS) ×2 IMPLANT
ELECT REM PT RETURN 9FT ADLT (ELECTROSURGICAL)
ELECTRODE REM PT RTRN 9FT ADLT (ELECTROSURGICAL) IMPLANT
GAUZE 4X4 16PLY RFD (DISPOSABLE) ×2 IMPLANT
GLOVE ECLIPSE 7.5 STRL STRAW (GLOVE) ×2 IMPLANT
GUARD TEETH (MISCELLANEOUS) IMPLANT
KIT BASIN OR (CUSTOM PROCEDURE TRAY) ×2 IMPLANT
KIT TURNOVER KIT B (KITS) ×2 IMPLANT
NEEDLE PRECISIONGLIDE 27X1.5 (NEEDLE) IMPLANT
NEEDLE SPNL 25GX3.5 QUINCKE BL (NEEDLE) IMPLANT
NS IRRIG 1000ML POUR BTL (IV SOLUTION) ×2 IMPLANT
PAD ARMBOARD 7.5X6 YLW CONV (MISCELLANEOUS) ×4 IMPLANT
PATTIES SURGICAL .5 X1 (DISPOSABLE) ×2 IMPLANT
SOL ANTI FOG 6CC (MISCELLANEOUS) IMPLANT
SOLUTION ANTI FOG 6CC (MISCELLANEOUS)
SPECIMEN JAR SMALL (MISCELLANEOUS) IMPLANT
SYR 3ML LL SCALE MARK (SYRINGE) IMPLANT
SYR CONTROL 10ML LL (SYRINGE) ×2 IMPLANT
SYR GAUGE ALLIANCE SINGLE USE (MISCELLANEOUS) ×2 IMPLANT
SYR TB 1ML LUER SLIP (SYRINGE) IMPLANT
TOWEL GREEN STERILE FF (TOWEL DISPOSABLE) ×2 IMPLANT
TUBE CONNECTING 12X1/4 (SUCTIONS) ×2 IMPLANT
WATER STERILE IRR 1000ML POUR (IV SOLUTION) ×2 IMPLANT

## 2020-10-28 NOTE — Op Note (Signed)
OPERATIVE REPORT  DATE OF SURGERY: 10/28/2020  PATIENT:  Caitlyn Anderson,  60 y.o. female  PRE-OPERATIVE DIAGNOSIS:  Idiopathic Subglottic Tracheal Stenosis  POST-OPERATIVE DIAGNOSIS:  Idiopathic Subglottic Tracheal Stenosis  PROCEDURE:  Procedure(s): MICROLARYNGOSCOPY WITH CO2 LASER & BALOON DILATION  SURGEON:  Beckie Salts, MD  ASSISTANTS: None  ANESTHESIA:   General   EBL: Minimal  DRAINS: None  LOCAL MEDICATIONS USED:  None  SPECIMEN:  none  COUNTS:  Correct  PROCEDURE DETAILS: The patient was taken to the operating room and placed on the operating table in the supine position. Following induction of general endotracheal anesthesia, using a long 4 endotracheal tube, the table was turned 90 degrees and the patient was draped in a standard fashion for endoscopy.  Saline soaked eye pads were used and saline soaked towels were used around the face during the laser portion of the procedure.  A maxillary tooth protector was used throughout the endoscopy.  A Jako laryngoscope was entered into the oral cavity used to visualize the larynx and attached to the Mayo stand with the suspension apparatus.  Once the airway was secured the endotracheal tube was removed and was easily replaced through the laryngoscope.  When the endotracheal tube was initially placed it was easily passed to the cords but there was difficulty progressing beyond the subglottic region.  Ventilations were maintained throughout the case.  The operating microscope was brought into the field.  The tube was removed and the larynx and subglottic region were inspected.  There was a posteriorly based subglottic scar/stenosis that seem to extend for about half-three-quarter centimeter from top to bottom.  3 radial incisions were created with the CO2 laser using 3 W continuous power.  These were created at 6:00 4:00 and 8:00.  Endotracheal tube was replaced periodically to maintain saturations.  After the laser procedure was  completed the balloon dilation was accomplished.  The balloon was dilated initially up to 4.5 atm was corresponded to 8 mm and then up to 13.5 atm which then correlated to 15 mm.  Following this I was able to pass a #7 endotracheal tube without much difficulty.  A Hopkins rod telescope was used to inspect the area.  Beyond the subglottic stenotic segment the trachea and carina looked normal.  The endoscope was removed.  The care of the patient was then handed back to anesthesia for awakening and extubation.  PATIENT DISPOSITION:  To PACU, stable

## 2020-10-28 NOTE — Interval H&P Note (Signed)
History and Physical Interval Note:  10/28/2020 9:10 AM  Caitlyn Anderson  has presented today for surgery, with the diagnosis of Idiopathic Subglottic Tracheal Stenosis.  The various methods of treatment have been discussed with the patient and family. After consideration of risks, benefits and other options for treatment, the patient has consented to  Procedure(s): MICROLARYNGOSCOPY WITH CO2 LASER & BALOON DILATION (N/A) as a surgical intervention.  The patient's history has been reviewed, patient examined, no change in status, stable for surgery.  I have reviewed the patient's chart and labs.  Questions were answered to the patient's satisfaction.     Izora Gala

## 2020-10-28 NOTE — Anesthesia Preprocedure Evaluation (Addendum)
Anesthesia Evaluation  Patient identified by MRN, date of birth, ID band Patient awake    Reviewed: Allergy & Precautions, NPO status , Patient's Chart, lab work & pertinent test results  History of Anesthesia Complications Negative for: history of anesthetic complications  Airway Mallampati: III  TM Distance: >3 FB Neck ROM: Full  Mouth opening: Limited Mouth Opening  Dental  (+) Teeth Intact   Pulmonary asthma ,    Pulmonary exam normal        Cardiovascular hypertension, Normal cardiovascular exam     Neuro/Psych negative neurological ROS     GI/Hepatic Neg liver ROS, GERD  Medicated,  Endo/Other  diabetes, Type 2, Oral Hypoglycemic AgentsHypothyroidism   Renal/GU negative Renal ROS  negative genitourinary   Musculoskeletal negative musculoskeletal ROS (+)   Abdominal   Peds  Hematology negative hematology ROS (+)   Anesthesia Other Findings  Subglottic stenosis  Reproductive/Obstetrics                          Anesthesia Physical Anesthesia Plan  ASA: II  Anesthesia Plan: General   Post-op Pain Management:    Induction: Intravenous  PONV Risk Score and Plan: 3 and Ondansetron, Dexamethasone, Treatment may vary due to age or medical condition and Midazolam  Airway Management Planned: Oral ETT  Additional Equipment: None  Intra-op Plan:   Post-operative Plan: Extubation in OR  Informed Consent: I have reviewed the patients History and Physical, chart, labs and discussed the procedure including the risks, benefits and alternatives for the proposed anesthesia with the patient or authorized representative who has indicated his/her understanding and acceptance.     Dental advisory given  Plan Discussed with:   Anesthesia Plan Comments:         Anesthesia Quick Evaluation

## 2020-10-28 NOTE — Anesthesia Postprocedure Evaluation (Signed)
Anesthesia Post Note  Patient: DAVIE SAGONA  Procedure(s) Performed: MICROLARYNGOSCOPY WITH CO2 LASER & BALOON DILATION (N/A Throat)     Patient location during evaluation: PACU Anesthesia Type: General Level of consciousness: awake and alert Pain management: pain level controlled Vital Signs Assessment: post-procedure vital signs reviewed and stable Respiratory status: spontaneous breathing, nonlabored ventilation and respiratory function stable Cardiovascular status: blood pressure returned to baseline and stable Postop Assessment: no apparent nausea or vomiting Anesthetic complications: no   No complications documented.  Last Vitals:  Vitals:   10/28/20 1059 10/28/20 1115  BP: 110/71 118/80  Pulse: 72 74  Resp: 13 20  Temp:  36.7 C  SpO2: 96% 94%    Last Pain:  Vitals:   10/28/20 1115  TempSrc:   PainSc: 0-No pain                 Lidia Collum

## 2020-10-28 NOTE — Transfer of Care (Signed)
Immediate Anesthesia Transfer of Care Note  Patient: Caitlyn Anderson  Procedure(s) Performed: MICROLARYNGOSCOPY WITH CO2 LASER & BALOON DILATION (N/A Throat)  Patient Location: PACU  Anesthesia Type:General  Level of Consciousness: drowsy  Airway & Oxygen Therapy: Patient Spontanous Breathing and Patient connected to face mask oxygen  Post-op Assessment: Report given to RN and Post -op Vital signs reviewed and stable  Post vital signs: Reviewed and stable  Last Vitals:  Vitals Value Taken Time  BP 111/79 10/28/20 1029  Temp    Pulse 84 10/28/20 1031  Resp 17 10/28/20 1031  SpO2 97 % 10/28/20 1031  Vitals shown include unvalidated device data.  Last Pain:  Vitals:   10/28/20 0804  TempSrc: Oral         Complications: No complications documented.

## 2020-10-28 NOTE — Anesthesia Procedure Notes (Signed)
Procedure Name: Intubation Date/Time: 10/28/2020 9:36 AM Performed by: Janene Harvey, CRNA Pre-anesthesia Checklist: Patient identified, Emergency Drugs available, Suction available and Patient being monitored Patient Re-evaluated:Patient Re-evaluated prior to induction Oxygen Delivery Method: Circle system utilized Preoxygenation: Pre-oxygenation with 100% oxygen Induction Type: IV induction Ventilation: Mask ventilation without difficulty Laryngoscope Size: Glidescope and 3 Grade View: Grade II Tube type: MLT Tube size: 4.0 mm Number of attempts: 1 Airway Equipment and Method: Stylet and Oral airway Placement Confirmation: ETT inserted through vocal cords under direct vision,  positive ETCO2 and breath sounds checked- equal and bilateral Secured at: 22 cm Tube secured with: Tape Dental Injury: Teeth and Oropharynx as per pre-operative assessment  Comments: Intubation by SRNA. Elective glidescope, grade II view. 4.0 MLT passed, some resistance noted once cuff through cords/ no air to cuff due to narrow subglottic opening.

## 2020-10-29 ENCOUNTER — Encounter (HOSPITAL_COMMUNITY): Payer: Self-pay | Admitting: Otolaryngology

## 2020-11-09 ENCOUNTER — Other Ambulatory Visit: Payer: Self-pay | Admitting: Otolaryngology

## 2020-11-09 DIAGNOSIS — H9312 Tinnitus, left ear: Secondary | ICD-10-CM

## 2020-11-09 DIAGNOSIS — J386 Stenosis of larynx: Secondary | ICD-10-CM

## 2020-11-22 ENCOUNTER — Encounter: Payer: BC Managed Care – PPO | Admitting: Thoracic Surgery (Cardiothoracic Vascular Surgery)

## 2020-11-22 ENCOUNTER — Other Ambulatory Visit: Payer: Self-pay | Admitting: Emergency Medicine

## 2020-12-12 ENCOUNTER — Other Ambulatory Visit: Payer: Self-pay

## 2020-12-12 ENCOUNTER — Ambulatory Visit
Admission: RE | Admit: 2020-12-12 | Discharge: 2020-12-12 | Disposition: A | Discharge status: Home / Self Care | Payer: BC Managed Care – PPO | Source: Ambulatory Visit | Attending: Otolaryngology | Admitting: Otolaryngology

## 2020-12-12 DIAGNOSIS — J386 Stenosis of larynx: Secondary | ICD-10-CM

## 2020-12-12 DIAGNOSIS — H9312 Tinnitus, left ear: Secondary | ICD-10-CM

## 2020-12-12 MED ORDER — GADOBENATE DIMEGLUMINE 529 MG/ML IV SOLN
18.0000 mL | Freq: Once | INTRAVENOUS | Status: AC | PRN
  Administered 2020-12-12: 18 mL via INTRAVENOUS

## 2021-05-15 NOTE — H&P (Signed)
HPI:   Caitlyn Anderson is a 60 y.o. female who presents as a return Patient.   Current problem: Subglottic stenosis.  HPI: Return visit. She was treated by Dr. Roxan Hockey a couple of years ago with laser bronchoscopy and dilation. She had some problems with her breathing postop and ended up in the ICU for a little bit of time. Since then she has done well until a few months ago when she started being coming symptomatic again. She is having noisy breathing and shortness of breath. She also suffers with chronic heartburn. She drinks a significant amount of caffeine on a daily basis. She has been on PPI therapy for about a month. She had a CT recently which revealed a severe stenosis about 2.5 cm below the glottis. No history of intubation.  PMH/Meds/All/SocHx/FamHx/ROS:   Past Medical History:  Diagnosis Date   Allergic rhinitis   Hyperlipidemia   Hypertension   Left bundle branch block   Past Surgical History:  Procedure Laterality Date   BREAST SURGERY   CESAREAN SECTION   DILATION AND CURETTAGE OF UTERUS   TONSILLECTOMY   Video bronchoscopy   No family history of bleeding disorders, wound healing problems or difficulty with anesthesia.   Social History   Socioeconomic History   Marital status: Married  Spouse name: Not on file   Number of children: Not on file   Years of education: Not on file   Highest education level: Not on file  Occupational History   Not on file  Tobacco Use   Smoking status: Never Smoker   Smokeless tobacco: Never Used  Vaping Use   Vaping Use: Never used  Substance and Sexual Activity   Alcohol use: No   Drug use: Not on file   Sexual activity: Not on file  Other Topics Concern   Not on file  Social History Narrative   Not on file   Social Determinants of Health   Financial Resource Strain: Not on file  Food Insecurity: Not on file  Transportation Needs: Not on file  Physical Activity: Not on file  Stress: Not on file  Social  Connections: Not on file  Housing Stability: Not on file   Current Outpatient Medications:   albuterol 90 mcg/actuation inhaler, Inhale into the lungs., Disp: , Rfl:   aspirin 81 MG chewable tablet *ANTIPLATELET*, Take 81 mg by mouth., Disp: , Rfl:   atorvastatin (LIPITOR) 20 MG tablet, Take 20 mg by mouth daily., Disp: , Rfl:   budesonide-formoterol (SYMBICORT) 160-4.5 mcg/actuation inhaler, Symbicort 160 mcg-4.5 mcg/actuation HFA aerosol inhaler, Disp: , Rfl:   cetirizine (ZYRTEC) 10 MG tablet, Take 10 mg by mouth., Disp: , Rfl:   CHEMOTHERAPY NON FORMULARY, xiidra 5 % soln, Disp: , Rfl:   cholecalciferol, vitamin D3, 1,000 unit capsule, Take 1 capsule by mouth., Disp: , Rfl:   erythromycin 5 mg/gram (0.5 %) ophthalmic ointment, INSTILL OINTMENT TO AFFECTED AREA INTO EACH EYE THREE TIMES DAILY, Disp: , Rfl:   escitalopram oxalate (LEXAPRO) 10 MG tablet, Take 1 capsule by mouth., Disp: , Rfl:   fluticasone propionate (FLONASE) 50 mcg/actuation nasal spray, Inhale 1 spray into the lungs., Disp: , Rfl:   JANUVIA 100 mg tablet, Take 100 mg by mouth daily., Disp: , Rfl:   latanoprost (XALATAN) 0.005 % ophthalmic solution, INSTILL 1 DROP INTO EACH EYE AT BEDTIME, Disp: , Rfl:   levothyroxine (SYNTHROID, LEVOTHROID) 75 MCG tablet, Take 75 mcg by mouth., Disp: , Rfl:   metFORMIN (GLUCOPHAGE) 1000 MG tablet, Take  1 capsule by mouth., Disp: , Rfl:   montelukast (SINGULAIR) 10 mg tablet, Take 10 mg by mouth daily., Disp: , Rfl:   pantoprazole (PROTONIX) 20 MG tablet, Take 20 mg by mouth daily., Disp: , Rfl:   prednisoLONE acetate (PRED FORTE) 1 % ophthalmic suspension, INSTILL 1 DROP INTO EACH EYE 4 TIMES DAILY, Disp: , Rfl:   valsartan (DIOVAN) 160 MG tablet, Take 160 mg by mouth., Disp: , Rfl:    Physical Exam:   Healthy-appearing lady in no distress. She does have biphasic noisy breathing with mild stridor. She is in no respiratory distress. Oral cavity and pharynx are healthy and clear. Indirect  exam of the larynx reveals healthy normal vocal cords. I cannot visualize the subglottis. Upper airway down to the level of the cords is completely normal. No palpable neck masses.  Independent Review of Additional Tests or Records:  none  Procedures:  none  Impression & Plans:  Idiopathic subglottic stenosis. Recommend laser laryngoscopy with balloon dilation. This is felt to be caused by chronic reflux. Recommend complete avoidance of all caffeine. Recommend stay on PPI for now and may be at some point we can transition to H2 blocker therapy which may be safer in the long run. She understands and agrees.

## 2021-05-18 ENCOUNTER — Other Ambulatory Visit: Payer: Self-pay

## 2021-05-18 ENCOUNTER — Encounter (HOSPITAL_COMMUNITY): Payer: Self-pay | Admitting: Otolaryngology

## 2021-05-18 NOTE — Progress Notes (Signed)
PCP - Dr. Lunette Stands Cardiologist - Dr. Alroy Dust [facility])  EKG - 10/28/20 Chest x-ray -  ECHO -  Cardiac Cath - denies CPAP -   Fasting Blood Sugar:  140s Checks Blood Sugar:  1x/day  Blood Thinner Instructions:  Aspirin Instructions: Follow your surgeon's instructions on when to stop Aspirin.  If no instructions were given by your surgeon then you will need to call the office to get those instructions.    ERAS Protcol - n/a  COVID TEST- n/a  Anesthesia review: n/a  -------------  SDW INSTRUCTIONS:  Your procedure is scheduled on Friday 11/11. Please report to [facility] Main Entrance "A" at [place].M., and check in at the Admitting office. Call this number if you have problems the morning of surgery: [phone]   Remember: Do not eat or drink after midnight the night before your surgery   Medications to take morning of surgery with a sip of water include: Inhaler --- Please bring all inhalers with you the day of surgery.  atorvastatin (LIPITOR)  cetirizine (ZYRTEC) doxycycline (PERIOSTAT) escitalopram (LEXAPRO) fluticasone (FLONASE)  levothyroxine (SYNTHROID, LEVOTHROID)  pantoprazole (PROTONIX)   As of today, STOP taking any Aspirin (unless otherwise instructed by your surgeon), Aleve, Naproxen, Ibuprofen, Motrin, Advil, Goody's, BC's, all herbal medications, fish oil, and all vitamins.  ** PLEASE check your blood sugar the morning of your surgery when you wake up and every 2 hours until you get to the Short Stay unit.  If your blood sugar is less than 70 mg/dL, you will need to treat for low blood sugar: Do not take insulin. Treat a low blood sugar (less than 70 mg/dL) with  cup of clear juice (cranberry or apple), 4 glucose tablets, OR glucose gel. Recheck blood sugar in 15 minutes after treatment (to make sure it is greater than 70 mg/dL). If your blood sugar is not greater than 70 mg/dL on recheck, call [phone] for further instructions.   The Morning of  Surgery Do not wear jewelry, make-up or nail polish. Do not wear lotions, powders, or perfumes or deodorant  Do not bring valuables to the hospital. [facility] is not responsible for any belongings or valuables.  If you are a smoker, DO NOT Smoke 24 hours prior to surgery  If you wear a CPAP at night please bring your mask the morning of surgery   Remember that you must have someone to transport you home after your surgery, and remain with you for 24 hours if you are discharged the same day.  Please bring cases for contacts, glasses, hearing aids, dentures or bridgework because it cannot be worn into surgery.   Patients discharged the day of surgery will not be allowed to drive home.   Please shower the NIGHT BEFORE/MORNING OF SURGERY (use antibacterial soap like DIAL soap if possible). Wear comfortable clothes the morning of surgery. Oral Hygiene is also important to reduce your risk of infection.  Remember - BRUSH YOUR TEETH THE MORNING OF SURGERY WITH YOUR REGULAR TOOTHPASTE  Patient denies shortness of breath, fever, cough and chest pain.

## 2021-05-18 NOTE — Anesthesia Preprocedure Evaluation (Addendum)
Anesthesia Evaluation  Patient identified by MRN, date of birth, ID band Patient awake    Reviewed: Allergy & Precautions, NPO status , Patient's Chart, lab work & pertinent test results  History of Anesthesia Complications Negative for: history of anesthetic complications  Airway Mallampati: III  TM Distance: >3 FB Neck ROM: Full  Mouth opening: Limited Mouth Opening  Dental  (+) Teeth Intact   Pulmonary asthma (uses rescue inhaler once per day, usualyl gets more frequent as she gets closer to needing another baloon dilation) ,    Pulmonary exam normal        Cardiovascular hypertension (168/81 in preop, per pt normally lower), Pt. on medications Normal cardiovascular exam     Neuro/Psych negative neurological ROS  negative psych ROS   GI/Hepatic Neg liver ROS, GERD  Medicated and Controlled,  Endo/Other  diabetes, Well Controlled, Type 2, Oral Hypoglycemic AgentsHypothyroidism Obesity BMI 37 a1c 6.3 FS 126  Renal/GU negative Renal ROS  negative genitourinary   Musculoskeletal  (+) Arthritis , Osteoarthritis,    Abdominal   Peds  Hematology negative hematology ROS (+)   Anesthesia Other Findings Subglottic stenosis  Reproductive/Obstetrics negative OB ROS                            Anesthesia Physical Anesthesia Plan  ASA: 3  Anesthesia Plan: General   Post-op Pain Management:    Induction: Intravenous  PONV Risk Score and Plan: 3 and Ondansetron, Dexamethasone, Midazolam and Treatment may vary due to age or medical condition  Airway Management Planned: Oral ETT and Video Laryngoscope Planned  Additional Equipment:   Intra-op Plan:   Post-operative Plan: Extubation in OR  Informed Consent: I have reviewed the patients History and Physical, chart, labs and discussed the procedure including the risks, benefits and alternatives for the proposed anesthesia with the patient or  authorized representative who has indicated his/her understanding and acceptance.     Dental advisory given  Plan Discussed with: CRNA  Anesthesia Plan Comments: (Last balloon dilation April 2022: size 4.0 oral microlaryngoscopy tube )      Anesthesia Quick Evaluation

## 2021-05-19 ENCOUNTER — Encounter (HOSPITAL_COMMUNITY)
Admission: RE | Disposition: A | Discharge status: Home / Self Care | Payer: Self-pay | Source: Home / Self Care | Attending: Otolaryngology

## 2021-05-19 ENCOUNTER — Other Ambulatory Visit: Payer: Self-pay

## 2021-05-19 ENCOUNTER — Ambulatory Visit (HOSPITAL_COMMUNITY)
Admission: RE | Admit: 2021-05-19 | Discharge: 2021-05-19 | Disposition: A | Discharge status: Home / Self Care | Payer: BC Managed Care – PPO | Attending: Otolaryngology | Admitting: Otolaryngology

## 2021-05-19 ENCOUNTER — Encounter (HOSPITAL_COMMUNITY): Payer: Self-pay | Admitting: Otolaryngology

## 2021-05-19 ENCOUNTER — Ambulatory Visit (HOSPITAL_COMMUNITY): Payer: BC Managed Care – PPO | Admitting: Anesthesiology

## 2021-05-19 DIAGNOSIS — J386 Stenosis of larynx: Secondary | ICD-10-CM | POA: Diagnosis not present

## 2021-05-19 DIAGNOSIS — J45909 Unspecified asthma, uncomplicated: Secondary | ICD-10-CM | POA: Insufficient documentation

## 2021-05-19 DIAGNOSIS — Z6836 Body mass index (BMI) 36.0-36.9, adult: Secondary | ICD-10-CM | POA: Insufficient documentation

## 2021-05-19 DIAGNOSIS — E039 Hypothyroidism, unspecified: Secondary | ICD-10-CM | POA: Diagnosis not present

## 2021-05-19 DIAGNOSIS — I10 Essential (primary) hypertension: Secondary | ICD-10-CM | POA: Insufficient documentation

## 2021-05-19 DIAGNOSIS — E669 Obesity, unspecified: Secondary | ICD-10-CM | POA: Insufficient documentation

## 2021-05-19 DIAGNOSIS — Z7984 Long term (current) use of oral hypoglycemic drugs: Secondary | ICD-10-CM | POA: Insufficient documentation

## 2021-05-19 DIAGNOSIS — E119 Type 2 diabetes mellitus without complications: Secondary | ICD-10-CM | POA: Insufficient documentation

## 2021-05-19 DIAGNOSIS — K219 Gastro-esophageal reflux disease without esophagitis: Secondary | ICD-10-CM | POA: Diagnosis not present

## 2021-05-19 HISTORY — PX: MICROLARYNGOSCOPY WITH LASER AND BALLOON DILATION: SHX5973

## 2021-05-19 LAB — CBC
HCT: 44.7 % (ref 36.0–46.0)
Hemoglobin: 14.8 g/dL (ref 12.0–15.0)
MCH: 31.4 pg (ref 26.0–34.0)
MCHC: 33.1 g/dL (ref 30.0–36.0)
MCV: 94.9 fL (ref 80.0–100.0)
Platelets: 280 10*3/uL (ref 150–400)
RBC: 4.71 MIL/uL (ref 3.87–5.11)
RDW: 13.1 % (ref 11.5–15.5)
WBC: 10.6 10*3/uL — ABNORMAL HIGH (ref 4.0–10.5)
nRBC: 0 % (ref 0.0–0.2)

## 2021-05-19 LAB — BASIC METABOLIC PANEL
Anion gap: 13 (ref 5–15)
BUN: 14 mg/dL (ref 8–23)
CO2: 23 mmol/L (ref 22–32)
Calcium: 9.9 mg/dL (ref 8.9–10.3)
Chloride: 103 mmol/L (ref 98–111)
Creatinine, Ser: 0.91 mg/dL (ref 0.44–1.00)
GFR, Estimated: >60 mL/min (ref >60)
Glucose, Bld: 121 mg/dL — ABNORMAL HIGH (ref 70–99)
Potassium: 4.1 mmol/L (ref 3.5–5.1)
Sodium: 139 mmol/L (ref 135–145)

## 2021-05-19 LAB — GLUCOSE, CAPILLARY
Glucose-Capillary: 126 mg/dL — ABNORMAL HIGH (ref 70–99)
Glucose-Capillary: 136 mg/dL — ABNORMAL HIGH (ref 70–99)

## 2021-05-19 LAB — SURGICAL PCR SCREEN
MRSA, PCR: NEGATIVE
Staphylococcus aureus: NEGATIVE

## 2021-05-19 SURGERY — MICROLARYNGOSCOPY WITH LASER AND BALLOON DILATION
Anesthesia: General | Site: Throat

## 2021-05-19 MED ORDER — ORAL CARE MOUTH RINSE: 15.0000 mL | Freq: Once | OROMUCOSAL | Status: AC

## 2021-05-19 MED ORDER — MIDAZOLAM HCL 2 MG/2ML IJ SOLN
INTRAMUSCULAR | Status: DC | PRN
  Administered 2021-05-19: 2 mg via INTRAVENOUS

## 2021-05-19 MED ORDER — LACTATED RINGERS IV SOLN: INTRAVENOUS | Status: DC

## 2021-05-19 MED ORDER — PROPOFOL 500 MG/50ML IV EMUL
INTRAVENOUS | Status: DC | PRN
  Administered 2021-05-19: 100 ug/kg/min via INTRAVENOUS

## 2021-05-19 MED ORDER — ONDANSETRON HCL 4 MG/2ML IJ SOLN
INTRAMUSCULAR | Status: DC | PRN
  Administered 2021-05-19: 4 mg via INTRAVENOUS

## 2021-05-19 MED ORDER — ONDANSETRON HCL 4 MG/2ML IJ SOLN
INTRAMUSCULAR | Status: AC
  Filled 2021-05-19: qty 2

## 2021-05-19 MED ORDER — CLINDAMYCIN PHOSPHATE 900 MG/50ML IV SOLN
INTRAVENOUS | Status: DC | PRN
  Administered 2021-05-19: 900 mg via INTRAVENOUS

## 2021-05-19 MED ORDER — DEXAMETHASONE SODIUM PHOSPHATE 10 MG/ML IJ SOLN
INTRAMUSCULAR | Status: DC | PRN
  Administered 2021-05-19: 10 mg via INTRAVENOUS

## 2021-05-19 MED ORDER — PROPOFOL 10 MG/ML IV BOLUS
INTRAVENOUS | Status: AC
  Filled 2021-05-19: qty 20

## 2021-05-19 MED ORDER — OXYCODONE HCL 5 MG PO TABS: 5.0000 mg | ORAL_TABLET | Freq: Once | ORAL | Status: DC | PRN

## 2021-05-19 MED ORDER — EPINEPHRINE PF 1 MG/ML IJ SOLN
INTRAMUSCULAR | Status: AC
  Filled 2021-05-19: qty 2

## 2021-05-19 MED ORDER — OXYCODONE HCL 5 MG/5ML PO SOLN: 5.0000 mg | Freq: Once | ORAL | Status: DC | PRN

## 2021-05-19 MED ORDER — ROCURONIUM BROMIDE 10 MG/ML (PF) SYRINGE
PREFILLED_SYRINGE | INTRAVENOUS | Status: DC | PRN
  Administered 2021-05-19: 70 mg via INTRAVENOUS

## 2021-05-19 MED ORDER — CLINDAMYCIN PHOSPHATE 900 MG/50ML IV SOLN
INTRAVENOUS | Status: AC
  Filled 2021-05-19: qty 50

## 2021-05-19 MED ORDER — ATROPINE SULFATE 0.4 MG/ML IV SOLN
INTRAVENOUS | Status: AC
  Filled 2021-05-19: qty 1

## 2021-05-19 MED ORDER — SUCCINYLCHOLINE CHLORIDE 200 MG/10ML IV SOSY
PREFILLED_SYRINGE | INTRAVENOUS | Status: DC | PRN
  Administered 2021-05-19: 100 mg via INTRAVENOUS

## 2021-05-19 MED ORDER — PROMETHAZINE HCL 25 MG/ML IJ SOLN: 6.2500 mg | INTRAMUSCULAR | Status: DC | PRN

## 2021-05-19 MED ORDER — FENTANYL CITRATE (PF) 250 MCG/5ML IJ SOLN
INTRAMUSCULAR | Status: DC | PRN
  Administered 2021-05-19 (×3): 50 ug via INTRAVENOUS

## 2021-05-19 MED ORDER — CLINDAMYCIN HCL 300 MG PO CAPS: 300.0000 mg | ORAL_CAPSULE | Freq: Three times a day (TID) | ORAL | 0 refills | Status: DC

## 2021-05-19 MED ORDER — CHLORHEXIDINE GLUCONATE 0.12 % MT SOLN
15.0000 mL | Freq: Once | OROMUCOSAL | Status: AC
  Administered 2021-05-19: 15 mL via OROMUCOSAL
  Filled 2021-05-19: qty 15

## 2021-05-19 MED ORDER — PROPOFOL 10 MG/ML IV BOLUS
INTRAVENOUS | Status: DC | PRN
  Administered 2021-05-19: 130 mg via INTRAVENOUS

## 2021-05-19 MED ORDER — MIDAZOLAM HCL 2 MG/2ML IJ SOLN
INTRAMUSCULAR | Status: AC
  Filled 2021-05-19: qty 2

## 2021-05-19 MED ORDER — SUGAMMADEX SODIUM 200 MG/2ML IV SOLN
INTRAVENOUS | Status: DC | PRN
  Administered 2021-05-19: 100 mg via INTRAVENOUS
  Administered 2021-05-19: 200 mg via INTRAVENOUS

## 2021-05-19 MED ORDER — FENTANYL CITRATE (PF) 100 MCG/2ML IJ SOLN: 25.0000 ug | INTRAMUSCULAR | Status: DC | PRN

## 2021-05-19 MED ORDER — LIDOCAINE 2% (20 MG/ML) 5 ML SYRINGE
INTRAMUSCULAR | Status: DC | PRN
  Administered 2021-05-19: 40 mg via INTRAVENOUS

## 2021-05-19 MED ORDER — FENTANYL CITRATE (PF) 250 MCG/5ML IJ SOLN
INTRAMUSCULAR | Status: AC
  Filled 2021-05-19: qty 5

## 2021-05-19 MED ORDER — DEXAMETHASONE SODIUM PHOSPHATE 10 MG/ML IJ SOLN
INTRAMUSCULAR | Status: AC
  Filled 2021-05-19: qty 1

## 2021-05-19 SURGICAL SUPPLY — 28 items
BAG COUNTER SPONGE SURGICOUNT (BAG) ×2 IMPLANT
BALLN PULM 15 16.5 18X75 (BALLOONS)
BALLN PULMONARY 10-12 (MISCELLANEOUS) ×2 IMPLANT
BALLN PULMONARY 8-10 OD75 (MISCELLANEOUS) IMPLANT
BALLOON PULM 15 16.5 18X75 (BALLOONS) IMPLANT
BLADE SURG 15 STRL LF DISP TIS (BLADE) IMPLANT
BLADE SURG 15 STRL SS (BLADE)
BNDG EYE OVAL (GAUZE/BANDAGES/DRESSINGS) IMPLANT
CANISTER SUCT 3000ML PPV (MISCELLANEOUS) ×2 IMPLANT
COVER BACK TABLE 60X90IN (DRAPES) ×2 IMPLANT
COVER MAYO STAND STRL (DRAPES) ×2 IMPLANT
DRAPE HALF SHEET 40X57 (DRAPES) ×2 IMPLANT
GAUZE 4X4 16PLY ~~LOC~~+RFID DBL (SPONGE) ×2 IMPLANT
GLOVE SURG LTX SZ7.5 (GLOVE) ×2 IMPLANT
GOWN STRL REUS W/ TWL LRG LVL3 (GOWN DISPOSABLE) ×3 IMPLANT
GOWN STRL REUS W/TWL LRG LVL3 (GOWN DISPOSABLE) ×3
GUARD TEETH (MISCELLANEOUS) ×2 IMPLANT
KIT BASIN OR (CUSTOM PROCEDURE TRAY) ×2 IMPLANT
NEEDLE PRECISIONGLIDE 27X1.5 (NEEDLE) IMPLANT
NEEDLE SPNL 22GX3.5 QUINCKE BK (NEEDLE) IMPLANT
NS IRRIG 1000ML POUR BTL (IV SOLUTION) ×2 IMPLANT
PATTIES SURGICAL .5 X3 (DISPOSABLE) ×2 IMPLANT
SOL ANTI FOG 6CC (MISCELLANEOUS) ×1 IMPLANT
SOLUTION ANTI FOG 6CC (MISCELLANEOUS) ×1
SURGILUBE 2OZ TUBE FLIPTOP (MISCELLANEOUS) IMPLANT
SYR GAUGE ASSEMBLY ALLIANCE II (MISCELLANEOUS) ×2 IMPLANT
TOWEL GREEN STERILE FF (TOWEL DISPOSABLE) ×2 IMPLANT
TUBE CONNECTING 12X1/4 (SUCTIONS) ×2 IMPLANT

## 2021-05-19 NOTE — Anesthesia Postprocedure Evaluation (Signed)
Anesthesia Post Note  Patient: Caitlyn Anderson  Procedure(s) Performed: MICROLARYNGOSCOPY WITH CO2 LASER AND BALLOON DILATION (Throat)     Patient location during evaluation: PACU Anesthesia Type: General Level of consciousness: awake and alert, oriented and patient cooperative Pain management: pain level controlled Vital Signs Assessment: post-procedure vital signs reviewed and stable Respiratory status: spontaneous breathing, nonlabored ventilation and respiratory function stable Cardiovascular status: blood pressure returned to baseline and stable Postop Assessment: no apparent nausea or vomiting Anesthetic complications: no   No notable events documented.  Last Vitals:  Vitals:   05/19/21 0805 05/19/21 1108  BP: (!) 168/81 133/75  Pulse: 90 90  Resp: 18 11  Temp: 36.9 C (!) 36.1 C  SpO2: (!) 81% 100%    Last Pain:  Vitals:   05/19/21 1108  TempSrc:   PainSc: 0-No pain                 Pervis Hocking

## 2021-05-19 NOTE — Transfer of Care (Signed)
Immediate Anesthesia Transfer of Care Note  Patient: Caitlyn Anderson  Procedure(s) Performed: MICROLARYNGOSCOPY WITH CO2 LASER AND BALLOON DILATION (Throat)  Patient Location: PACU  Anesthesia Type:General  Level of Consciousness: awake, alert  and oriented  Airway & Oxygen Therapy: Patient Spontanous Breathing and Patient connected to face mask oxygen  Post-op Assessment: Report given to RN and Post -op Vital signs reviewed and stable  Post vital signs: Reviewed and stable  Last Vitals:  Vitals Value Taken Time  BP 133/75 05/19/21 1108  Temp 36.1 C 05/19/21 1108  Pulse 85 05/19/21 1114  Resp 12 05/19/21 1114  SpO2 97 % 05/19/21 1114  Vitals shown include unvalidated device data.  Last Pain:  Vitals:   05/19/21 1108  TempSrc:   PainSc: 0-No pain         Complications: No notable events documented.

## 2021-05-19 NOTE — Anesthesia Procedure Notes (Signed)
Procedure Name: Intubation Date/Time: 05/19/2021 10:14 AM Performed by: Carolan Clines, CRNA Pre-anesthesia Checklist: Patient identified, Emergency Drugs available, Suction available and Patient being monitored Patient Re-evaluated:Patient Re-evaluated prior to induction Oxygen Delivery Method: Circle System Utilized Preoxygenation: Pre-oxygenation with 100% oxygen Induction Type: IV induction Ventilation: Mask ventilation without difficulty Laryngoscope Size: Glidescope and 3 Grade View: Grade I Tube type: MLT Tube size: 4.0 mm Number of attempts: 1 Airway Equipment and Method: Stylet and Video-laryngoscopy Placement Confirmation: ETT inserted through vocal cords under direct vision, positive ETCO2 and breath sounds checked- equal and bilateral Tube secured with: Tape Dental Injury: Teeth and Oropharynx as per pre-operative assessment  Difficulty Due To: Difficulty was anticipated Future Recommendations: Recommend- induction with short-acting agent, and alternative techniques readily available Comments: Elective glidescope intubation. Unable to advance cuff of MLT through cords. Able to ventilate adequate volumes. Dr. Constance Holster to advance upon start of procedure.

## 2021-05-19 NOTE — Interval H&P Note (Signed)
History and Physical Interval Note:  05/19/2021 9:32 AM  Caitlyn Anderson  has presented today for surgery, with the diagnosis of Idiopathic subglottic stenosis.  The various methods of treatment have been discussed with the patient and family. After consideration of risks, benefits and other options for treatment, the patient has consented to  Procedure(s): MICROLARYNGOSCOPY WITH CO2 LASER AND BALLOON DILATION (N/A) as a surgical intervention.  The patient's history has been reviewed, patient examined, no change in status, stable for surgery.  I have reviewed the patient's chart and labs.  Questions were answered to the patient's satisfaction.     Izora Gala

## 2021-05-19 NOTE — Op Note (Signed)
OPERATIVE REPORT  DATE OF SURGERY: 05/19/2021  PATIENT:  Caitlyn Anderson,  60 y.o. female  PRE-OPERATIVE DIAGNOSIS:  Idiopathic subglottic stenosis  POST-OPERATIVE DIAGNOSIS:  Idiopathic subglottic stenosis  PROCEDURE:  Procedure(s): MICROLARYNGOSCOPY WITH CO2 LASER AND BALLOON DILATION  SURGEON:  Jefry H Rosen, MD  ASSISTANTS: none  ANESTHESIA:   General   EBL: Less than 10 ml  DRAINS: none  LOCAL MEDICATIONS USED:  None  SPECIMEN: None  COUNTS:  Correct  PROCEDURE DETAILS: The patient was taken to the operating room and placed on the operating table in the supine position. Following induction of general endotracheal anesthesia, the table was turned 90 and the patient was draped in a standard fashion.  Patient was initially intubated with a #4 uncuffed endotracheal tube and there was some difficulty passing through the subglottic stenosis.  He was turned 90 degrees.  Patient was draped in standard fashion including saline soaked eye pads and wet towels around the face.  Maxillary tooth protector was used throughout the case.  The Dedo laryngoscope was entered into the oral cavity used to visualize the larynx and suspended to the Mayo stand with the suspension apparatus.  The operating microscope was then brought into view.  Endotracheal tube was removed and then passed through the laryngoscope.  The remainder of the case was done using apneic technique removing the tube and replacing as needed.  The CO2 laser at 3 W continuous power was then used to create radial cuts along the subglottic stenotic segment which was more posteriorly in the left-sided based.  Incisions were created at 5:00, 7:00 and 9:00.  Patient was intermittently reintubated and ventilated keeping saturations at appropriate levels.  The CRE balloon was then used, 8 atm pressure corresponding to 12 mm dilation was used on the subglottic segment for 30 seconds.  Following that a #7 cuffed endotracheal tube was placed  without difficulty.  The scope was removed and the patient's care was handed back to anesthesia for awakening.    PATIENT DISPOSITION:  To PACU, stable    

## 2021-05-20 ENCOUNTER — Encounter (HOSPITAL_COMMUNITY): Payer: Self-pay | Admitting: Otolaryngology

## 2022-05-03 NOTE — H&P (Signed)
HPI:   Caitlyn Anderson is a 61 y.o. female who presents as a return Patient.   Referring Provider: Jacelyn Pi*  Chief complaint: Stridor.  HPI: She has been having more difficulty with her breathing again. The last procedure was almost a year ago.  PMH/Meds/All/SocHx/FamHx/ROS:   Past Medical History:  Diagnosis Date   Allergic rhinitis   Hyperlipidemia   Hypertension   Left bundle branch block   Past Surgical History:  Procedure Laterality Date   BREAST SURGERY   CESAREAN SECTION   DILATION AND CURETTAGE OF UTERUS   TONSILLECTOMY   Video bronchoscopy   No family history of bleeding disorders, wound healing problems or difficulty with anesthesia.   Social History   Socioeconomic History   Marital status: Married  Spouse name: Not on file   Number of children: Not on file   Years of education: Not on file   Highest education level: Not on file  Occupational History   Not on file  Tobacco Use   Smoking status: Never   Smokeless tobacco: Never  Vaping Use   Vaping Use: Never used  Substance and Sexual Activity   Alcohol use: No   Drug use: Not on file   Sexual activity: Not on file  Other Topics Concern   Not on file  Social History Narrative   Not on file   Social Determinants of Health   Financial Resource Strain: Not on file  Food Insecurity: Not on file  Transportation Needs: Not on file  Physical Activity: Not on file  Stress: Not on file  Social Connections: Not on file  Housing Stability: Not on file   Current Outpatient Medications:   albuterol 90 mcg/actuation inhaler, Inhale into the lungs., Disp: , Rfl:   aspirin 81 MG chewable tablet *ANTIPLATELET*, Chew 1 tablet (81 mg total) by mouth., Disp: , Rfl:   atorvastatin (LIPITOR) 20 MG tablet, Take 1 tablet (20 mg total) by mouth daily., Disp: , Rfl:   cholecalciferol, vitamin D3, 1,000 unit capsule, Take 1 capsule (1,000 Units total) by mouth., Disp: , Rfl:   erythromycin 5 mg/gram (0.5  %) ophthalmic ointment, INSTILL OINTMENT TO AFFECTED AREA INTO EACH EYE THREE TIMES DAILY, Disp: , Rfl:   escitalopram oxalate (LEXAPRO) 10 MG tablet, Take 1 capsule by mouth., Disp: , Rfl:   fluticasone propionate (FLONASE) 50 mcg/actuation nasal spray, Inhale 1 spray into the lungs., Disp: , Rfl:   JANUVIA 100 mg tablet, Take 1 tablet (100 mg total) by mouth daily., Disp: , Rfl:   latanoprost (XALATAN) 0.005 % ophthalmic solution, INSTILL 1 DROP INTO EACH EYE AT BEDTIME, Disp: , Rfl:   levothyroxine (SYNTHROID, LEVOTHROID) 75 MCG tablet, Take 1 tablet (75 mcg total) by mouth., Disp: , Rfl:   metFORMIN (GLUCOPHAGE) 1000 MG tablet, Take 1 capsule by mouth., Disp: , Rfl:   pantoprazole (PROTONIX) 20 MG tablet, Take 1 tablet (20 mg total) by mouth daily., Disp: , Rfl:   prednisoLONE acetate (PRED FORTE) 1 % ophthalmic suspension, INSTILL 1 DROP INTO EACH EYE 4 TIMES DAILY, Disp: , Rfl:   valsartan (DIOVAN) 160 MG tablet, Take 1 tablet (160 mg total) by mouth., Disp: , Rfl:   budesonide-formoterol (SYMBICORT) 160-4.5 mcg/actuation inhaler, Symbicort 160 mcg-4.5 mcg/actuation HFA aerosol inhaler, Disp: , Rfl:   cetirizine (ZYRTEC) 10 MG tablet, Take 1 tablet (10 mg total) by mouth., Disp: , Rfl:   CHEMOTHERAPY NON FORMULARY, xiidra 5 % soln, Disp: , Rfl:   doxycycline hyclate 20 MG tablet,  Take 1 tablet (20 mg total) by mouth daily., Disp: , Rfl:   montelukast (SINGULAIR) 10 mg tablet, Take 1 tablet (10 mg total) by mouth daily., Disp: , Rfl:   A complete ROS was performed with pertinent positives/negatives noted in the HPI. The remainder of the ROS are negative.   Physical Exam:   Temp (!) 96.1 F (35.6 C)  Ht 1.524 m (5')  Wt 90.7 kg (200 lb)  BMI 39.06 kg/m   General: Healthy and alert, in no distress, breathing well but biphasic stridor. Normal affect. In a pleasant mood. Head: Normocephalic, atraumatic. No masses, or scars. Eyes: Pupils are equal, and reactive to light. Vision is  grossly intact. No spontaneous or gaze nystagmus. Ears: Ear canals are clear. Tympanic membranes are intact, with normal landmarks and the middle ears are clear and healthy. Hearing: Grossly normal. Nose: Nasal cavities are clear with healthy mucosa, no polyps or exudate. Airways are patent. Face: No masses or scars, facial nerve function is symmetric. Oral Cavity: No mucosal abnormalities are noted. Tongue with normal mobility. Dentition appears healthy. Oropharynx: Tonsils are symmetric. There are no mucosal masses identified. Tongue base appears normal and healthy. Larynx/Hypopharynx: indirect exam reveals healthy, mobile vocal cords, without mucosal lesions in the hypopharynx or larynx. Chest: Deferred Neck: No palpable masses, no cervical adenopathy, no thyroid nodules or enlargement. Neuro: Cranial nerves II-XII with normal function. Balance: Normal gate. Other findings: none.  Independent Review of Additional Tests or Records:  none  Procedures:  none  Impression & Plans:  Idiopathic subglottic stenosis. Recommend we schedule her for another laser laryngoscopy and balloon dilation.

## 2022-05-08 ENCOUNTER — Encounter (HOSPITAL_COMMUNITY): Payer: Self-pay | Admitting: Otolaryngology

## 2022-05-08 NOTE — Progress Notes (Signed)
Internal Med - Dr Audery Amel Vasireddy Cardiologist - Dr Alroy Dust  Chest x-ray - n/a EKG - DOS Stress Test - n/a ECHO - n/a Cardiac Cath - n/a  ICD Pacemaker/Loop - n/a  Sleep Study -  n/a CPAP - none  Do not take Metformin or Januvia on the morning of surgery.  If your blood sugar is less than 70 mg/dL, you will need to treat for low blood sugar: Treat a low blood sugar (less than 70 mg/dL) with  cup of clear juice (cranberry or apple), 4 glucose tablets, OR glucose gel. Recheck blood sugar in 15 minutes after treatment (to make sure it is greater than 70 mg/dL). If your blood sugar is not greater than 70 mg/dL on recheck, call [phone] for further instructions.  Aspirin Instructions: Follow your surgeon's instructions on when to stop aspirin prior to surgery,  If no instructions were given by your surgeon then you will need to call the office for those instructions.  Anesthesia review: Yes  STOP now taking any Aspirin (unless otherwise instructed by your surgeon), Aleve, Naproxen, Ibuprofen, Motrin, Advil, Goody's, BC's, all herbal medications, fish oil, and all vitamins.   Coronavirus Screening Do you have any of the following symptoms:  Cough yes/no: No Fever (>100.74F)  yes/no: No Runny nose yes/no: No Sore throat yes/no: No Difficulty breathing/shortness of breath  yes/no: No  Have you traveled in the last 14 days and where? yes/no: No  Patient verbalized understanding of instructions that were given via phone.

## 2022-05-08 NOTE — Progress Notes (Signed)
Anesthesia Chart Review: Caitlyn Anderson  Case: [id number] Date/Time: 05/09/22 1300   Procedure: MICROLARYNGOSCOPY WITH LASER AND BALLOON DILATION   Anesthesia type: General   Pre-op diagnosis: Tracheal web; Idiopathic subglottic tracheal stenosis   Location: [facility] OR ROOM 09 / [facility]   Surgeons: Izora Gala, MD       DISCUSSION: Patient is a 61 year old female scheduled for the above procedure. She has idiopathic subglottic tracheal stenosis with last dilation 05/19/21.  History includes never smoker, HTN, HLD, left BBB (diagnosed at least by 02/2016), DM2, dyspnea, hypothyroidism, GERD, tracheal stenosis (s/p laser bronchoscopy for resection of tracheal web 03/24/18; s/p microlaryngoscopy with laser balloon dilation 10/28/20, 05/19/21), osteoarthritis (left THA 06/26/18).   She has known chronic LBBB (work-up in 2017) with preoperative evaluation in 2019 by cardiologist Dr. Rosalita Chessman at [facility] for left THA. If she has had more recent visit or EKG then staff to request, otherwise she could get an EKG on arrival for surgery.   Anesthesia team to evaluate on the day of surgery.    VS:  BP Readings from Last 3 Encounters:  05/19/21 126/79  10/28/20 118/80  09/26/20 124/76   Pulse Readings from Last 3 Encounters:  05/19/21 77  10/28/20 74  09/26/20 91     PROVIDERS: Dairl Ponder, MD is PCP Baltazar Apo, MD is pulmonologist Cleora Fleet, MD is cardiologist at [facility]. She saw him on 01/08/18 for preoperative evaluation for left THA (records scanned under Media tab, Correspondence, Enc Date 05/19/18). He noted patient has chronic left BBB with "negative ischemia workup."    LABS: For day of surgery as indicated.    PFTs 08/16/20: FVC 2.78 (99%), post 2.77 (98%). FEV1 1.82 (84%), post 1.47 (68%). DLCO unc 15.69 (90%).   EKG:  Last EKG currently available is > 4 year old. 10/28/20:  Normal sinus rhythm Non-specific intra-ventricular  conduction block Abnormal ECG Confirmed by Loralie Champagne ([id number]) on 10/30/2020 11:36:21 AM   CV: Echo 03/01/16 (copy from [facility]; scanned under Media tab, Christen Bame Date 03/1618): Interpretation:  1.  LV is normal in size and systolic function.  Estimated ejection fraction of 60 to 65%. 2.  RV is normal in size and systolic function. 3.  No hemodynamically significant valvular abnormalities (trivial TR).   Stress test report not available at [facility] or at [facility], although, Dr. Milta Deiters note mentions a "Negative stress test 2017."    Past Medical History:  Diagnosis Date   Allergic rhinitis    Asthma    Diabetes mellitus without complication ([place])    Type ii   Dyspnea    GERD (gastroesophageal reflux disease)    Hyperlipidemia    Hypertension    Hypothyroidism    Left bundle branch block    Tracheal stenosis     Past Surgical History:  Procedure Laterality Date   BREAST SURGERY Left    lumpectomy   CESAREAN SECTION     COLONOSCOPY     DILATION AND CURETTAGE OF UTERUS     FLEXIBLE BRONCHOSCOPY N/A 03/24/2018   Procedure: FLEXIBLE BRONCHOSCOPY;  Surgeon: Melrose Nakayama, MD;  Location: [facility];  Service: Thoracic;  Laterality: N/A;   LASER BRONCHOSCOPY N/A 03/24/2018   Procedure: LASER BRONCHOSCOPY FOR RESECTION OF TRACHEAL WEB;  Surgeon: Melrose Nakayama, MD;  Location: [facility] OR;  Service: Thoracic;  Laterality: N/A;   MICROLARYNGOSCOPY N/A 10/28/2020   Procedure: MICROLARYNGOSCOPY WITH CO2 LASER &  BALOON DILATION;  Surgeon: Izora Gala, MD;  Location: [facility];  Service: ENT;  Laterality: N/A;   MICROLARYNGOSCOPY WITH LASER AND BALLOON DILATION N/A 05/19/2021   Procedure: MICROLARYNGOSCOPY WITH CO2 LASER AND BALLOON DILATION;  Surgeon: Izora Gala, MD;  Location: [facility];  Service: ENT;  Laterality: N/A;   TONSILLECTOMY     TOTAL HIP ARTHROPLASTY Left 06/26/2018   Procedure: TOTAL HIP ARTHROPLASTY ANTERIOR APPROACH;  Surgeon: Rod Can, MD;  Location: [facility];  Service: Orthopedics;  Laterality: Left;   VIDEO BRONCHOSCOPY Bilateral 02/03/2018   Procedure: VIDEO BRONCHOSCOPY WITHOUT FLUORO;  Surgeon: Collene Gobble, MD;  Location: [facility] ENDOSCOPY;  Service: Cardiopulmonary;  Laterality: Bilateral;   VIDEO BRONCHOSCOPY N/A 05/19/2018   Procedure: VIDEO BRONCHOSCOPY;  Surgeon: Melrose Nakayama, MD;  Location: [facility] OR;  Service: Thoracic;  Laterality: N/A;    MEDICATIONS: No current facility-administered medications for this encounter.    albuterol (VENTOLIN HFA) 108 (90 Base) MCG/ACT inhaler   aspirin EC 81 MG tablet   atorvastatin (LIPITOR) 20 MG tablet   Cholecalciferol (VITAMIN D3) 50 MCG (2000 UT) TABS   erythromycin ophthalmic ointment   escitalopram (LEXAPRO) 10 MG tablet   latanoprost (XALATAN) 0.005 % ophthalmic solution   levothyroxine (SYNTHROID, LEVOTHROID) 75 MCG tablet   metFORMIN (GLUCOPHAGE) 1000 MG tablet   pantoprazole (PROTONIX) 20 MG tablet   PRESCRIPTION MEDICATION   sitaGLIPtin (JANUVIA) 100 MG tablet   valsartan (DIOVAN) 160 MG tablet   blood glucose meter kit and supplies KIT    Myra Gianotti, PA-C Surgical Short Stay/Anesthesiology [facility] Phone [phone] [facility] Phone [phone] 05/08/2022 11:54 AM

## 2022-05-08 NOTE — Anesthesia Preprocedure Evaluation (Signed)
Anesthesia Evaluation  Patient identified by MRN, date of birth, ID band Patient awake    Reviewed: Allergy & Precautions, NPO status , Patient's Chart, lab work & pertinent test results  Airway Mallampati: II  TM Distance: >3 FB Neck ROM: Full    Dental no notable dental hx.    Pulmonary asthma ,  Subglottic stenosis    + decreased breath sounds + stridor     Cardiovascular hypertension, Pt. on medications + dysrhythmias (LBBB)  Rhythm:Regular Rate:Normal     Neuro/Psych Anxiety negative neurological ROS     GI/Hepatic Neg liver ROS, GERD  Medicated,  Endo/Other  diabetes, Type 2, Oral Hypoglycemic AgentsHypothyroidism   Renal/GU negative Renal ROS  negative genitourinary   Musculoskeletal  (+) Arthritis , Osteoarthritis,    Abdominal Normal abdominal exam  (+)   Peds  Hematology   Anesthesia Other Findings   Reproductive/Obstetrics                            Anesthesia Physical Anesthesia Plan  ASA: 3  Anesthesia Plan: General   Post-op Pain Management:    Induction: Intravenous  PONV Risk Score and Plan: 3 and Ondansetron, Dexamethasone, Midazolam and Treatment may vary due to age or medical condition  Airway Management Planned: Mask and Oral ETT  Additional Equipment: None  Intra-op Plan:   Post-operative Plan: Extubation in OR  Informed Consent: I have reviewed the patients History and Physical, chart, labs and discussed the procedure including the risks, benefits and alternatives for the proposed anesthesia with the patient or authorized representative who has indicated his/her understanding and acceptance.     Dental advisory given  Plan Discussed with: CRNA  Anesthesia Plan Comments: (PAT note written 05/08/2022 by Myra Gianotti, PA-C. )       Anesthesia Quick Evaluation

## 2022-05-09 ENCOUNTER — Other Ambulatory Visit: Payer: Self-pay

## 2022-05-09 ENCOUNTER — Ambulatory Visit (HOSPITAL_COMMUNITY)
Admission: RE | Admit: 2022-05-09 | Discharge: 2022-05-09 | Disposition: A | Discharge status: Home / Self Care | Payer: BC Managed Care – PPO | Attending: Otolaryngology | Admitting: Otolaryngology

## 2022-05-09 ENCOUNTER — Ambulatory Visit (HOSPITAL_COMMUNITY): Payer: BC Managed Care – PPO | Admitting: Vascular Surgery

## 2022-05-09 ENCOUNTER — Encounter (HOSPITAL_COMMUNITY)
Admission: RE | Disposition: A | Discharge status: Home / Self Care | Payer: Self-pay | Source: Home / Self Care | Attending: Otolaryngology

## 2022-05-09 ENCOUNTER — Encounter (HOSPITAL_COMMUNITY): Payer: Self-pay | Admitting: Otolaryngology

## 2022-05-09 DIAGNOSIS — Q321 Other congenital malformations of trachea: Secondary | ICD-10-CM | POA: Diagnosis not present

## 2022-05-09 DIAGNOSIS — M1990 Unspecified osteoarthritis, unspecified site: Secondary | ICD-10-CM | POA: Diagnosis not present

## 2022-05-09 DIAGNOSIS — J386 Stenosis of larynx: Secondary | ICD-10-CM | POA: Insufficient documentation

## 2022-05-09 DIAGNOSIS — J398 Other specified diseases of upper respiratory tract: Secondary | ICD-10-CM | POA: Insufficient documentation

## 2022-05-09 DIAGNOSIS — Z7984 Long term (current) use of oral hypoglycemic drugs: Secondary | ICD-10-CM | POA: Diagnosis not present

## 2022-05-09 DIAGNOSIS — K219 Gastro-esophageal reflux disease without esophagitis: Secondary | ICD-10-CM | POA: Insufficient documentation

## 2022-05-09 DIAGNOSIS — Z1152 Encounter for screening for COVID-19: Secondary | ICD-10-CM | POA: Insufficient documentation

## 2022-05-09 DIAGNOSIS — J45909 Unspecified asthma, uncomplicated: Secondary | ICD-10-CM | POA: Diagnosis not present

## 2022-05-09 DIAGNOSIS — R061 Stridor: Secondary | ICD-10-CM | POA: Diagnosis not present

## 2022-05-09 DIAGNOSIS — I10 Essential (primary) hypertension: Secondary | ICD-10-CM | POA: Diagnosis not present

## 2022-05-09 DIAGNOSIS — E119 Type 2 diabetes mellitus without complications: Secondary | ICD-10-CM | POA: Insufficient documentation

## 2022-05-09 DIAGNOSIS — F419 Anxiety disorder, unspecified: Secondary | ICD-10-CM | POA: Insufficient documentation

## 2022-05-09 DIAGNOSIS — I447 Left bundle-branch block, unspecified: Secondary | ICD-10-CM | POA: Insufficient documentation

## 2022-05-09 DIAGNOSIS — Z7951 Long term (current) use of inhaled steroids: Secondary | ICD-10-CM | POA: Diagnosis not present

## 2022-05-09 DIAGNOSIS — E039 Hypothyroidism, unspecified: Secondary | ICD-10-CM | POA: Insufficient documentation

## 2022-05-09 DIAGNOSIS — I454 Nonspecific intraventricular block: Secondary | ICD-10-CM | POA: Insufficient documentation

## 2022-05-09 HISTORY — DX: Malignant (primary) neoplasm, unspecified: C80.1

## 2022-05-09 HISTORY — PX: MICROLARYNGOSCOPY WITH LASER AND BALLOON DILATION: SHX5973

## 2022-05-09 HISTORY — DX: Anxiety disorder, unspecified: F41.9

## 2022-05-09 LAB — BASIC METABOLIC PANEL
Anion gap: 9 (ref 5–15)
BUN: 10 mg/dL (ref 8–23)
CO2: 24 mmol/L (ref 22–32)
Calcium: 9.4 mg/dL (ref 8.9–10.3)
Chloride: 106 mmol/L (ref 98–111)
Creatinine, Ser: 0.89 mg/dL (ref 0.44–1.00)
GFR, Estimated: >60 mL/min (ref >60)
Glucose, Bld: 130 mg/dL — ABNORMAL HIGH (ref 70–99)
Potassium: 4.1 mmol/L (ref 3.5–5.1)
Sodium: 139 mmol/L (ref 135–145)

## 2022-05-09 LAB — CBC
HCT: 43.2 % (ref 36.0–46.0)
Hemoglobin: 14.5 g/dL (ref 12.0–15.0)
MCH: 30.9 pg (ref 26.0–34.0)
MCHC: 33.6 g/dL (ref 30.0–36.0)
MCV: 92.1 fL (ref 80.0–100.0)
Platelets: 265 10*3/uL (ref 150–400)
RBC: 4.69 MIL/uL (ref 3.87–5.11)
RDW: 13.3 % (ref 11.5–15.5)
WBC: 9.2 10*3/uL (ref 4.0–10.5)
nRBC: 0 % (ref 0.0–0.2)

## 2022-05-09 LAB — SARS CORONAVIRUS 2 BY RT PCR: SARS Coronavirus 2 by RT PCR: NEGATIVE

## 2022-05-09 LAB — GLUCOSE, CAPILLARY
Glucose-Capillary: 134 mg/dL — ABNORMAL HIGH (ref 70–99)
Glucose-Capillary: 131 mg/dL — ABNORMAL HIGH (ref 70–99)

## 2022-05-09 LAB — SURGICAL PCR SCREEN
MRSA, PCR: NEGATIVE
Staphylococcus aureus: POSITIVE — AB

## 2022-05-09 SURGERY — MICROLARYNGOSCOPY WITH LASER AND BALLOON DILATION
Anesthesia: General | Site: Throat

## 2022-05-09 MED ORDER — EPINEPHRINE PF 1 MG/ML IJ SOLN
INTRAMUSCULAR | Status: AC
  Filled 2022-05-09: qty 1

## 2022-05-09 MED ORDER — DEXAMETHASONE SODIUM PHOSPHATE 10 MG/ML IJ SOLN
INTRAMUSCULAR | Status: DC | PRN
  Administered 2022-05-09: 10 mg via INTRAVENOUS

## 2022-05-09 MED ORDER — PROPOFOL 10 MG/ML IV BOLUS
INTRAVENOUS | Status: DC | PRN
  Administered 2022-05-09: 160 mg via INTRAVENOUS

## 2022-05-09 MED ORDER — ROCURONIUM BROMIDE 10 MG/ML (PF) SYRINGE
PREFILLED_SYRINGE | INTRAVENOUS | Status: DC | PRN
  Administered 2022-05-09: 30 mg via INTRAVENOUS

## 2022-05-09 MED ORDER — PHENYLEPHRINE 80 MCG/ML (10ML) SYRINGE FOR IV PUSH (FOR BLOOD PRESSURE SUPPORT)
PREFILLED_SYRINGE | INTRAVENOUS | Status: DC | PRN
  Administered 2022-05-09: 160 ug via INTRAVENOUS

## 2022-05-09 MED ORDER — MIDAZOLAM HCL 2 MG/2ML IJ SOLN
INTRAMUSCULAR | Status: DC | PRN
  Administered 2022-05-09: 2 mg via INTRAVENOUS

## 2022-05-09 MED ORDER — EPINEPHRINE HCL (NASAL) 0.1 % NA SOLN
NASAL | Status: DC | PRN
  Administered 2022-05-09: 10 mL via TOPICAL

## 2022-05-09 MED ORDER — CLINDAMYCIN HCL 300 MG PO CAPS: 300.0000 mg | ORAL_CAPSULE | Freq: Three times a day (TID) | ORAL | 0 refills | Status: DC

## 2022-05-09 MED ORDER — EPINEPHRINE HCL (NASAL) 0.1 % NA SOLN
NASAL | Status: AC
  Filled 2022-05-09: qty 30

## 2022-05-09 MED ORDER — PREDNISONE 10 MG (21) PO TBPK: ORAL_TABLET | ORAL | 0 refills | Status: DC

## 2022-05-09 MED ORDER — FENTANYL CITRATE (PF) 250 MCG/5ML IJ SOLN
INTRAMUSCULAR | Status: AC
  Filled 2022-05-09: qty 5

## 2022-05-09 MED ORDER — LACTATED RINGERS IV SOLN: INTRAVENOUS | Status: DC

## 2022-05-09 MED ORDER — CHLORHEXIDINE GLUCONATE 0.12 % MT SOLN: 15.0000 mL | Freq: Once | OROMUCOSAL | Status: AC

## 2022-05-09 MED ORDER — MIDAZOLAM HCL 2 MG/2ML IJ SOLN
INTRAMUSCULAR | Status: AC
  Filled 2022-05-09: qty 2

## 2022-05-09 MED ORDER — ORAL CARE MOUTH RINSE: 15.0000 mL | Freq: Once | OROMUCOSAL | Status: AC

## 2022-05-09 MED ORDER — PROPOFOL 500 MG/50ML IV EMUL
INTRAVENOUS | Status: DC | PRN
  Administered 2022-05-09: 20 mg via INTRAVENOUS
  Administered 2022-05-09: 125 ug/kg/min via INTRAVENOUS

## 2022-05-09 MED ORDER — SUGAMMADEX SODIUM 200 MG/2ML IV SOLN
INTRAVENOUS | Status: DC | PRN
  Administered 2022-05-09: 200 mg via INTRAVENOUS

## 2022-05-09 MED ORDER — INSULIN ASPART 100 UNIT/ML IJ SOLN: 0.0000 [IU] | INTRAMUSCULAR | Status: DC | PRN

## 2022-05-09 MED ORDER — SUCCINYLCHOLINE CHLORIDE 200 MG/10ML IV SOSY
PREFILLED_SYRINGE | INTRAVENOUS | Status: DC | PRN
  Administered 2022-05-09: 100 mg via INTRAVENOUS

## 2022-05-09 MED ORDER — CHLORHEXIDINE GLUCONATE 0.12 % MT SOLN
OROMUCOSAL | Status: AC
  Administered 2022-05-09: 15 mL via OROMUCOSAL
  Filled 2022-05-09: qty 15

## 2022-05-09 MED ORDER — ONDANSETRON HCL 4 MG/2ML IJ SOLN
INTRAMUSCULAR | Status: DC | PRN
  Administered 2022-05-09: 4 mg via INTRAVENOUS

## 2022-05-09 MED ORDER — LIDOCAINE 2% (20 MG/ML) 5 ML SYRINGE
INTRAMUSCULAR | Status: DC | PRN
  Administered 2022-05-09: 60 mg via INTRAVENOUS

## 2022-05-09 MED ORDER — FENTANYL CITRATE (PF) 250 MCG/5ML IJ SOLN
INTRAMUSCULAR | Status: DC | PRN
  Administered 2022-05-09: 150 ug via INTRAVENOUS

## 2022-05-09 SURGICAL SUPPLY — 32 items
BAG COUNTER SPONGE SURGICOUNT (BAG) ×1 IMPLANT
BALLN PULM 12 13.5 15X75 (BALLOONS) ×1
BALLN PULM 15 16.5 18X75 (BALLOONS)
BALLN PULMONARY 10-12 (MISCELLANEOUS) IMPLANT
BALLN PULMONARY 8-10 OD75 (MISCELLANEOUS) IMPLANT
BALLOON PULM 12 13.5 15X75 (BALLOONS) IMPLANT
BALLOON PULM 15 16.5 18X75 (BALLOONS) IMPLANT
BLADE SURG 15 STRL LF DISP TIS (BLADE) IMPLANT
BLADE SURG 15 STRL SS (BLADE)
BNDG EYE OVAL (GAUZE/BANDAGES/DRESSINGS) ×2 IMPLANT
CANISTER SUCT 3000ML PPV (MISCELLANEOUS) ×1 IMPLANT
COVER BACK TABLE 60X90IN (DRAPES) ×1 IMPLANT
COVER MAYO STAND STRL (DRAPES) ×1 IMPLANT
DRAPE HALF SHEET 40X57 (DRAPES) ×1 IMPLANT
GAUZE 4X4 16PLY ~~LOC~~+RFID DBL (SPONGE) ×1 IMPLANT
GLOVE ECLIPSE 7.5 STRL STRAW (GLOVE) ×1 IMPLANT
GOWN STRL REUS W/ TWL LRG LVL3 (GOWN DISPOSABLE) ×2 IMPLANT
GOWN STRL REUS W/TWL LRG LVL3 (GOWN DISPOSABLE) ×2
GUARD TEETH (MISCELLANEOUS) IMPLANT
KIT BASIN OR (CUSTOM PROCEDURE TRAY) ×1 IMPLANT
NDL PRECISIONGLIDE 27X1.5 (NEEDLE) IMPLANT
NDL SPNL 22GX3.5 QUINCKE BK (NEEDLE) IMPLANT
NEEDLE PRECISIONGLIDE 27X1.5 (NEEDLE) IMPLANT
NEEDLE SPNL 22GX3.5 QUINCKE BK (NEEDLE) IMPLANT
NS IRRIG 1000ML POUR BTL (IV SOLUTION) ×1 IMPLANT
PATTIES SURGICAL .5 X3 (DISPOSABLE) ×1 IMPLANT
SOL ANTI FOG 6CC (MISCELLANEOUS) ×1 IMPLANT
SOLUTION ANTI FOG 6CC (MISCELLANEOUS) ×1
SURGILUBE 2OZ TUBE FLIPTOP (MISCELLANEOUS) IMPLANT
SYR GAUGE ASSEMBLY ALLIANCE II (MISCELLANEOUS) IMPLANT
TOWEL GREEN STERILE FF (TOWEL DISPOSABLE) ×1 IMPLANT
TUBE CONNECTING 12X1/4 (SUCTIONS) ×1 IMPLANT

## 2022-05-09 NOTE — Interval H&P Note (Signed)
History and Physical Interval Note:  05/09/2022 12:36 PM  [name]  has presented today for surgery, with the diagnosis of Tracheal web; Idiopathic subglottic tracheal stenosis.  The various methods of treatment have been discussed with the patient and family. After consideration of risks, benefits and other options for treatment, the patient has consented to  Procedure(s): MICROLARYNGOSCOPY WITH LASER AND BALLOON DILATION (N/A) as a surgical intervention.  The patient's history has been reviewed, patient examined, no change in status, stable for surgery.  I have reviewed the patient's chart and labs.  Questions were answered to the patient's satisfaction.     Caitlyn Anderson

## 2022-05-09 NOTE — Op Note (Signed)
OPERATIVE REPORT  DATE OF SURGERY: 05/09/2022  PATIENT:  Caitlyn Anderson,  61 y.o. female  PRE-OPERATIVE DIAGNOSIS:  Tracheal web; Idiopathic subglottic tracheal stenosis  POST-OPERATIVE DIAGNOSIS:  Tracheal web; Idiopathic subglottic tracheal stenosis  PROCEDURE:  Procedure(s): MICROLARYNGOSCOPY WITH LASER AND BALLOON DILATION  SURGEON:  Jefry H Rosen, MD  ASSISTANTS: none  ANESTHESIA:   General   EBL: Minimal.   DRAINS: none  LOCAL MEDICATIONS USED:  None  SPECIMEN: None  COUNTS:  Correct  PROCEDURE DETAILS: The patient was taken to the operating room and placed on the operating table in the supine position. Following induction of general endotracheal anesthesia, the table was turned 90 and the patient was draped in a standard fashion.  .  A maxillary tooth protector was used throughout the procedure.  Saline soaked eye pads were placed bilaterally.  A Jako laryngoscope was entered into the oral cavity used to visualize the larynx and suspended to the Mayo system stand with the suspension apparatus.  Saline soaked towels were then placed around the face for laser safety.  The operating microscope was brought into view and used to visualize the larynx.  The endotracheal tube was removed and replaced as needed.  Procedure was performed using apnea technique.  The tube was easily replaced through the laryngoscope.  A circumferential shelf was identified in the subglottic larynx/upper trachea.  It seemed to be eccentrically centered to the left and posteriorly.  The CO2 laser was used in a setting of 3 W continuous power and radial cuts were created at 12:00, 2:00, 7:00, 9:00, 11:00.  The CRE PULM balloon dilator was then used, first at 12 mm, 3 ATM, then at 13.5 mm, 4.5 ATM.  A #6 endotracheal tube was then replaced and the patient was handed back to anesthesia for awakening and extubation.     PATIENT DISPOSITION:  To PACU, stable    

## 2022-05-09 NOTE — Transfer of Care (Signed)
Immediate Anesthesia Transfer of Care Note  Patient: Caitlyn Anderson  Procedure(s) Performed: MICROLARYNGOSCOPY WITH LASER AND BALLOON DILATION (Throat)  Patient Location: PACU  Anesthesia Type:General  Level of Consciousness: oriented, drowsy, and patient cooperative  Airway & Oxygen Therapy: Patient Spontanous Breathing and Patient connected to face mask oxygen  Post-op Assessment: Report given to RN and Post -op Vital signs reviewed and stable  Post vital signs: Reviewed and stable  Last Vitals:  Vitals Value Taken Time  BP 131/72 05/09/22 1413  Temp    Pulse 80 05/09/22 1414  Resp 12 05/09/22 1414  SpO2 97 % 05/09/22 1414  Vitals shown include unvalidated device data.  Last Pain:  Vitals:   05/09/22 1200  TempSrc:   PainSc: 0-No pain         Complications:  Encounter Notable Events  Notable Event Outcome Phase Comment  Difficult to intubate - expected  Intraprocedure Filed from anesthesia note documentation.

## 2022-05-09 NOTE — Anesthesia Postprocedure Evaluation (Signed)
Anesthesia Post Note  Patient: ELIKA GODAR  Procedure(s) Performed: MICROLARYNGOSCOPY WITH LASER AND BALLOON DILATION (Throat)     Patient location during evaluation: PACU Anesthesia Type: General Level of consciousness: awake and alert Pain management: pain level controlled Vital Signs Assessment: post-procedure vital signs reviewed and stable Respiratory status: spontaneous breathing, nonlabored ventilation, respiratory function stable and patient connected to nasal cannula oxygen Cardiovascular status: blood pressure returned to baseline and stable Postop Assessment: no apparent nausea or vomiting Anesthetic complications: yes   Encounter Notable Events  Notable Event Outcome Phase Comment  Difficult to intubate - expected  Intraprocedure Filed from anesthesia note documentation.    Last Vitals:  Vitals:   05/09/22 1500 05/09/22 1515  BP: (!) 140/73 (!) 143/75  Pulse: 68 75  Resp: 14 13  Temp:  36.6 C  SpO2: 92% 92%    Last Pain:  Vitals:   05/09/22 1413  TempSrc:   PainSc: 0-No pain                 Belenda Cruise P Sherry Blackard

## 2022-05-09 NOTE — Anesthesia Procedure Notes (Signed)
Procedure Name: Intubation Date/Time: 05/09/2022 1:10 PM  Performed by: Dorthea Cove, CRNAPre-anesthesia Checklist: Patient identified, Emergency Drugs available, Suction available and Patient being monitored Patient Re-evaluated:Patient Re-evaluated prior to induction Oxygen Delivery Method: Circle system utilized Preoxygenation: Pre-oxygenation with 100% oxygen Induction Type: IV induction Ventilation: Mask ventilation without difficulty and Two handed mask ventilation required Laryngoscope Size: Glidescope and 3 Grade View: Grade I Tube type: Oral Laser Tube: Cuffed inflated with minimal occlusive pressure - saline and Laser Tube Tube size: 4.0 mm Number of attempts: 1 Airway Equipment and Method: Stylet, Oral airway and Video-laryngoscopy Placement Confirmation: ETT inserted through vocal cords under direct vision, positive ETCO2 and breath sounds checked- equal and bilateral Secured at: 19 cm Tube secured with: Tape Dental Injury: Teeth and Oropharynx as per pre-operative assessment  Difficulty Due To: Difficulty was anticipated Future Recommendations: Recommend- induction with short-acting agent, and alternative techniques readily available Comments: Glidescope intubation with Dr. Constance Holster at bedside. Unable to successfully pass 4.0 MLT. EtCO2 noted after corkscrew motion resulting in cuff between cords. Cuff slightly inflated and tube marked at 19. Airway turned over to Dr. Constance Holster. Dr. Constance Holster intermittently removing and reinserting MLT with rigid laryngscope. SpO2 stable.

## 2022-05-10 ENCOUNTER — Encounter (HOSPITAL_COMMUNITY): Payer: Self-pay | Admitting: Otolaryngology

## 2023-12-18 NOTE — Progress Notes (Signed)
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  The risks (including but not limited to infection, bleeding, inflammation, loss of vision, retinal detachment, and the possible need for more surgery including laser capsulotomy) benefits of, and alternatives to cataract extraction with intraocular lens (IOL) implantation were carefully reviewed. Consent was completed for an operation to be scheduled in the near future.  IOL options and refractive targets were discussed as was the inability to achieve or guarantee clear uncorrected vision for both distance and near and the likely need for glasses for best vision at all ranges.  After reviewing this we opted for a postoperative refractive goal of good distance vision with a monofocal IOL. Discussed anisometropia that will be present between surgeries.

## 2024-01-12 ENCOUNTER — Other Ambulatory Visit: Payer: Self-pay

## 2024-01-12 ENCOUNTER — Emergency Department (HOSPITAL_COMMUNITY): Payer: Self-pay

## 2024-01-12 ENCOUNTER — Encounter (HOSPITAL_COMMUNITY): Payer: Self-pay

## 2024-01-12 ENCOUNTER — Inpatient Hospital Stay (HOSPITAL_COMMUNITY)
Admission: EM | Admit: 2024-01-12 | Discharge: 2024-01-18 | DRG: 373 | Disposition: A | Discharge status: Home / Self Care | Payer: Self-pay | Attending: Internal Medicine | Admitting: Internal Medicine

## 2024-01-12 DIAGNOSIS — Z6835 Body mass index (BMI) 35.0-35.9, adult: Secondary | ICD-10-CM

## 2024-01-12 DIAGNOSIS — E785 Hyperlipidemia, unspecified: Secondary | ICD-10-CM | POA: Diagnosis present

## 2024-01-12 DIAGNOSIS — Z85828 Personal history of other malignant neoplasm of skin: Secondary | ICD-10-CM

## 2024-01-12 DIAGNOSIS — R531 Weakness: Secondary | ICD-10-CM | POA: Diagnosis present

## 2024-01-12 DIAGNOSIS — E66812 Obesity, class 2: Secondary | ICD-10-CM | POA: Diagnosis present

## 2024-01-12 DIAGNOSIS — E039 Hypothyroidism, unspecified: Secondary | ICD-10-CM | POA: Diagnosis present

## 2024-01-12 DIAGNOSIS — K3532 Acute appendicitis with perforation and localized peritonitis, without abscess: Secondary | ICD-10-CM | POA: Diagnosis present

## 2024-01-12 DIAGNOSIS — Z7984 Long term (current) use of oral hypoglycemic drugs: Secondary | ICD-10-CM

## 2024-01-12 DIAGNOSIS — Z79890 Hormone replacement therapy (postmenopausal): Secondary | ICD-10-CM

## 2024-01-12 DIAGNOSIS — Z79899 Other long term (current) drug therapy: Secondary | ICD-10-CM

## 2024-01-12 DIAGNOSIS — E1165 Type 2 diabetes mellitus with hyperglycemia: Secondary | ICD-10-CM | POA: Diagnosis present

## 2024-01-12 DIAGNOSIS — Z880 Allergy status to penicillin: Secondary | ICD-10-CM

## 2024-01-12 DIAGNOSIS — E1169 Type 2 diabetes mellitus with other specified complication: Secondary | ICD-10-CM | POA: Diagnosis present

## 2024-01-12 DIAGNOSIS — Z83511 Family history of glaucoma: Secondary | ICD-10-CM

## 2024-01-12 DIAGNOSIS — J398 Other specified diseases of upper respiratory tract: Secondary | ICD-10-CM | POA: Diagnosis present

## 2024-01-12 DIAGNOSIS — J45909 Unspecified asthma, uncomplicated: Secondary | ICD-10-CM | POA: Diagnosis present

## 2024-01-12 DIAGNOSIS — K3533 Acute appendicitis with perforation and localized peritonitis, with abscess: Principal | ICD-10-CM | POA: Diagnosis present

## 2024-01-12 DIAGNOSIS — Z7982 Long term (current) use of aspirin: Secondary | ICD-10-CM

## 2024-01-12 DIAGNOSIS — K219 Gastro-esophageal reflux disease without esophagitis: Secondary | ICD-10-CM | POA: Diagnosis present

## 2024-01-12 DIAGNOSIS — K5900 Constipation, unspecified: Secondary | ICD-10-CM | POA: Diagnosis present

## 2024-01-12 DIAGNOSIS — F32A Depression, unspecified: Secondary | ICD-10-CM | POA: Diagnosis present

## 2024-01-12 DIAGNOSIS — I447 Left bundle-branch block, unspecified: Secondary | ICD-10-CM | POA: Diagnosis present

## 2024-01-12 DIAGNOSIS — F419 Anxiety disorder, unspecified: Secondary | ICD-10-CM | POA: Diagnosis present

## 2024-01-12 DIAGNOSIS — Z96642 Presence of left artificial hip joint: Secondary | ICD-10-CM | POA: Diagnosis present

## 2024-01-12 DIAGNOSIS — I10 Essential (primary) hypertension: Secondary | ICD-10-CM | POA: Diagnosis present

## 2024-01-12 LAB — COMPREHENSIVE METABOLIC PANEL WITH GFR
ALT: 44 U/L (ref 0–44)
AST: 22 U/L (ref 15–41)
Albumin: 3.3 g/dL — ABNORMAL LOW (ref 3.5–5.0)
Alkaline Phosphatase: 101 U/L (ref 38–126)
Anion gap: 15 (ref 5–15)
BUN: 12 mg/dL (ref 8–23)
CO2: 26 mmol/L (ref 22–32)
Calcium: 10.3 mg/dL (ref 8.9–10.3)
Chloride: 95 mmol/L — ABNORMAL LOW (ref 98–111)
Creatinine, Ser: 0.79 mg/dL (ref 0.44–1.00)
GFR, Estimated: >60 mL/min (ref >60)
Glucose, Bld: 204 mg/dL — ABNORMAL HIGH (ref 70–99)
Potassium: 4.5 mmol/L (ref 3.5–5.1)
Sodium: 136 mmol/L (ref 135–145)
Total Bilirubin: 0.9 mg/dL (ref 0.0–1.2)
Total Protein: 7.8 g/dL (ref 6.5–8.1)

## 2024-01-12 LAB — CBC WITH DIFFERENTIAL/PLATELET
Abs Immature Granulocytes: 0.58 K/uL — ABNORMAL HIGH (ref 0.00–0.07)
Basophils Absolute: 0.1 K/uL (ref 0.0–0.1)
Basophils Relative: 1 %
Eosinophils Absolute: 0.2 K/uL (ref 0.0–0.5)
Eosinophils Relative: 1 %
HCT: 45.4 % (ref 36.0–46.0)
Hemoglobin: 15.4 g/dL — ABNORMAL HIGH (ref 12.0–15.0)
Immature Granulocytes: 4 %
Lymphocytes Relative: 11 %
Lymphs Abs: 1.7 K/uL (ref 0.7–4.0)
MCH: 31.5 pg (ref 26.0–34.0)
MCHC: 33.9 g/dL (ref 30.0–36.0)
MCV: 92.8 fL (ref 80.0–100.0)
Monocytes Absolute: 0.8 K/uL (ref 0.1–1.0)
Monocytes Relative: 5 %
Neutro Abs: 12.4 K/uL — ABNORMAL HIGH (ref 1.7–7.7)
Neutrophils Relative %: 78 %
Platelets: 407 K/uL — ABNORMAL HIGH (ref 150–400)
RBC: 4.89 MIL/uL (ref 3.87–5.11)
RDW: 12.8 % (ref 11.5–15.5)
WBC: 15.7 K/uL — ABNORMAL HIGH (ref 4.0–10.5)
nRBC: 0 % (ref 0.0–0.2)

## 2024-01-12 LAB — LIPASE, BLOOD: Lipase: 32 U/L (ref 11–51)

## 2024-01-12 MED ORDER — LACTATED RINGERS IV BOLUS
1000.0000 mL | Freq: Once | INTRAVENOUS | Status: AC
  Administered 2024-01-12: 1000 mL via INTRAVENOUS

## 2024-01-12 MED ORDER — ONDANSETRON HCL 4 MG/2ML IJ SOLN
4.0000 mg | Freq: Once | INTRAMUSCULAR | Status: AC
  Administered 2024-01-12: 4 mg via INTRAVENOUS
  Filled 2024-01-12: qty 2

## 2024-01-12 MED ORDER — IOHEXOL 300 MG/ML  SOLN
100.0000 mL | Freq: Once | INTRAMUSCULAR | Status: AC | PRN
  Administered 2024-01-12: 100 mL via INTRAVENOUS

## 2024-01-12 NOTE — ED Notes (Signed)
 Patient transported to CT

## 2024-01-12 NOTE — ED Provider Notes (Signed)
 [place] [facility] AT [facility] Provider Note   CSN: [id number] Arrival date & time: 01/12/24  2144     Patient presents with: Emesis and Constipation   KAMRYN MESSINEO is a 63 y.o. female.  {Add pertinent Anderson, Caitlyn Anderson, Caitlyn Anderson, Caitlyn Anderson to HPI:[id number]} 63 year old female Anderson of [place], hyperlipidemia, hypertension who presents the ER today secondary to weeks worth of symptoms.  Patient states she started having some abdominal discomfort and bloating last Saturday.  She started having vomiting and fever on Sunday, Tmax 100.8.  She went to her PCP on Monday where he told her liver enzymes were elevated and electrolytes were abnormal and if he then got worse to come to the ER.  Patient states that nothing really got worse but did not improve over the last 5 to 6 days.  States that her last fever was a few days ago.  She states that oftentimes she will throw up shortly after eating.  Her pain is mostly in her right lower quadrant and comes and goes.  Her husband states that at 1 point he pushed on it and it seemed to make her worse.  No urinary symptoms.  She states she has had alternating diarrhea but also feeling of constipation.  She had nausea prior to coming in however that has improved with IV Zofran .  She states a decreased urine output today likely related to decreased intake per her report.  Allergic to penicillin but no other allergies.  No sick contacts.  No suspicious food intake.  No blood in her diarrhea, emesis nonbloody nonbilious.   Emesis Constipation Associated symptoms: vomiting        Prior to Admission medications   Medication Sig Start Date End Date Taking? Authorizing Provider  albuterol  (VENTOLIN  HFA) 108 (90 Base) MCG/ACT inhaler Inhale 2 puffs into the lungs every 6 (six) hours as needed for wheezing or shortness of breath.    [provider]  aspirin  EC 81 MG tablet Take 81 mg by mouth daily. Swallow whole.     [provider]  atorvastatin  (LIPITOR ) 20 MG tablet Take 20 mg by mouth daily.    [provider]  blood glucose meter kit and supplies KIT Dispense based on patient and insurance preference. Use up to four times daily as directed. Dx: E11.9 04/08/18   Maurice Sharlet RAMAN, PA-C  Cholecalciferol  (VITAMIN D3) 50 MCG (2000 UT) TABS Take 2,000 Units by mouth daily.    [provider]  clindamycin  (CLEOCIN ) 300 MG capsule Take 1 capsule (300 mg total) by mouth 3 (three) times daily. 05/09/22   Jesus Oliphant, MD  clindamycin  (CLEOCIN ) 300 MG capsule Take 1 capsule (300 mg total) by mouth 3 (three) times daily. 05/09/22   Jesus Oliphant, MD  erythromycin  ophthalmic ointment Place 1 application into both eyes at bedtime. 04/01/21   [provider]  escitalopram  (LEXAPRO ) 10 MG tablet Take 10 mg by mouth daily.     [provider]  latanoprost  (XALATAN ) 0.005 % ophthalmic solution Place 1 drop into both eyes at bedtime. 06/01/20   [provider]  levothyroxine  (SYNTHROID , LEVOTHROID) 75 MCG tablet Take 75 mcg by mouth daily before breakfast.    [provider]  metFORMIN  (GLUCOPHAGE ) 1000 MG tablet Take 1,000 mg by mouth 2 (two) times daily with a meal.    [provider]  pantoprazole  (PROTONIX ) 20 MG tablet Take 1 tablet by mouth once daily 11/23/20   Byrum, Lamar RAMAN, MD  predniSONE  (STERAPRED UNI-PAK 21 TAB) 10 MG (21) TBPK tablet Take 6 tablets the first day, 4 tablets for 2 days following that, 3 tablets the following day, 2 tablets the following day, 1 tablet in the last 2 days. 05/09/22   Jesus Oliphant, MD  PRESCRIPTION MEDICATION Place 1 drop into the right eye in the morning, at noon, in the evening, and at bedtime. Compound at [employer]  losarten potassium 0.8 mg /ml    [provider]  sitaGLIPtin (JANUVIA) 100 MG tablet Take 100 mg by mouth daily.    [provider]  valsartan  (DIOVAN ) 160 MG tablet  Take 1 tablet by mouth once daily 03/20/19   Byrum, Lamar RAMAN, MD    Allergies: Penicillins    Review of Systems  Gastrointestinal:  Positive for constipation and vomiting.    Updated Vital Signs BP (!) 151/95   Pulse 86   Temp 98.1 F (36.7 C) (Oral)   Resp 18   Ht 5' 1 (1.549 m)   Wt 87.5 kg   SpO2 96%   BMI 36.47 kg/m   Physical Exam Vitals and nursing note reviewed.  Constitutional:      Appearance: She is well-developed.  HENT:     Head: Normocephalic and atraumatic.     Mouth/Throat:     Mouth: Mucous membranes are dry.  Cardiovascular:     Rate and Rhythm: Normal rate and regular rhythm.  Pulmonary:     Effort: No respiratory distress.     Breath sounds: No stridor.  Abdominal:     General: There is no distension.  Musculoskeletal:        General: No swelling or tenderness. Normal range of motion.     Cervical back: Normal range of motion.  Neurological:     Mental Status: She is alert.     (all labs ordered are listed, but only abnormal results are displayed) Labs Reviewed  CBC WITH DIFFERENTIAL/PLATELET - Abnormal; Notable for the following components:      Result Value   WBC 15.7 (*)    Hemoglobin 15.4 (*)    Platelets 407 (*)    Neutro Abs 12.4 (*)    Abs Immature Granulocytes 0.58 (*)    All other components within normal limits  COMPREHENSIVE METABOLIC PANEL WITH GFR - Abnormal; Notable for the following components:   Chloride 95 (*)    Glucose, Bld 204 (*)    Albumin  3.3 (*)    All other components within normal limits  LIPASE, BLOOD  URINALYSIS, ROUTINE W REFLEX MICROSCOPIC    EKG: None  Radiology: No results found.  {Document cardiac monitor, telemetry assessment procedure when appropriate:32947} Procedures   Medications Ordered in the ED  iohexol  (OMNIPAQUE ) 300 MG/ML solution 100 mL (has no administration in time range)  ondansetron  (ZOFRAN ) injection 4 mg (4 mg Intravenous Given 01/12/24 2246)  lactated ringers  bolus 1,000 mL  (1,000 mLs Intravenous New Bag/Given 01/12/24 2326)      {Click here for ABCD2, HEART and other calculators REFRESH Note before signing:1}                              Anderson Decision Making Amount and/or Complexity of Data Reviewed Radiology: ordered.  Risk Prescription drug management.   64 year old female here with multiple GI symptoms.  Labs actually relatively reassuring relative to her report from her PCP.  Will start with some fluids and a CT scan to  rule out appendicitis, gallbladder disease.  Could be gastritis or ulcer as her symptoms seem to be worse after eating and her pancreas is normal.  {Document critical care time when appropriate  Document review of labs and clinical decision tools ie CHADS2VASC2, etc  Document your independent review of radiology images and any outside records  Document your discussion with family members, caretakers and with consultants  Document [place] determinants of health affecting pt's care  Document your decision making why or why not admission, treatments were needed:32947:::1}   Final diagnoses:  None    ED Discharge Orders     None

## 2024-01-12 NOTE — ED Triage Notes (Signed)
 Pt to ED with c/o nausea, vomiting, and constipation x one week. Pt says she saw her PCP and they said her electrolytes were off and liver enzymes were elevated. Pt says she has been constipated and having RLQ pain, pt still has appendix.

## 2024-01-13 ENCOUNTER — Ambulatory Visit (HOSPITAL_COMMUNITY)
Admit: 2024-01-13 | Discharge: 2024-01-13 | Disposition: A | Discharge status: Home / Self Care | Payer: Self-pay | Attending: Internal Medicine | Admitting: Internal Medicine

## 2024-01-13 DIAGNOSIS — F419 Anxiety disorder, unspecified: Secondary | ICD-10-CM | POA: Diagnosis present

## 2024-01-13 DIAGNOSIS — J398 Other specified diseases of upper respiratory tract: Secondary | ICD-10-CM | POA: Diagnosis present

## 2024-01-13 DIAGNOSIS — I10 Essential (primary) hypertension: Secondary | ICD-10-CM

## 2024-01-13 DIAGNOSIS — Z7982 Long term (current) use of aspirin: Secondary | ICD-10-CM | POA: Diagnosis not present

## 2024-01-13 DIAGNOSIS — K5900 Constipation, unspecified: Secondary | ICD-10-CM | POA: Diagnosis present

## 2024-01-13 DIAGNOSIS — K3532 Acute appendicitis with perforation and localized peritonitis, without abscess: Secondary | ICD-10-CM | POA: Diagnosis present

## 2024-01-13 DIAGNOSIS — E785 Hyperlipidemia, unspecified: Secondary | ICD-10-CM

## 2024-01-13 DIAGNOSIS — Z96642 Presence of left artificial hip joint: Secondary | ICD-10-CM | POA: Diagnosis present

## 2024-01-13 DIAGNOSIS — R7881 Bacteremia: Secondary | ICD-10-CM | POA: Diagnosis not present

## 2024-01-13 DIAGNOSIS — K651 Peritoneal abscess: Secondary | ICD-10-CM | POA: Insufficient documentation

## 2024-01-13 DIAGNOSIS — Z79899 Other long term (current) drug therapy: Secondary | ICD-10-CM | POA: Diagnosis not present

## 2024-01-13 DIAGNOSIS — E66812 Obesity, class 2: Secondary | ICD-10-CM | POA: Diagnosis present

## 2024-01-13 DIAGNOSIS — Z83511 Family history of glaucoma: Secondary | ICD-10-CM | POA: Diagnosis not present

## 2024-01-13 DIAGNOSIS — K3533 Acute appendicitis with perforation and localized peritonitis, with abscess: Secondary | ICD-10-CM | POA: Diagnosis present

## 2024-01-13 DIAGNOSIS — R531 Weakness: Secondary | ICD-10-CM | POA: Diagnosis present

## 2024-01-13 DIAGNOSIS — F32A Depression, unspecified: Secondary | ICD-10-CM | POA: Diagnosis present

## 2024-01-13 DIAGNOSIS — K219 Gastro-esophageal reflux disease without esophagitis: Secondary | ICD-10-CM | POA: Diagnosis present

## 2024-01-13 DIAGNOSIS — E1169 Type 2 diabetes mellitus with other specified complication: Secondary | ICD-10-CM | POA: Diagnosis present

## 2024-01-13 DIAGNOSIS — Z79890 Hormone replacement therapy (postmenopausal): Secondary | ICD-10-CM | POA: Diagnosis not present

## 2024-01-13 DIAGNOSIS — E039 Hypothyroidism, unspecified: Secondary | ICD-10-CM

## 2024-01-13 DIAGNOSIS — J4520 Mild intermittent asthma, uncomplicated: Secondary | ICD-10-CM

## 2024-01-13 DIAGNOSIS — Z880 Allergy status to penicillin: Secondary | ICD-10-CM | POA: Diagnosis not present

## 2024-01-13 DIAGNOSIS — Z7984 Long term (current) use of oral hypoglycemic drugs: Secondary | ICD-10-CM | POA: Diagnosis not present

## 2024-01-13 DIAGNOSIS — Z85828 Personal history of other malignant neoplasm of skin: Secondary | ICD-10-CM | POA: Diagnosis not present

## 2024-01-13 DIAGNOSIS — Z6835 Body mass index (BMI) 35.0-35.9, adult: Secondary | ICD-10-CM | POA: Diagnosis not present

## 2024-01-13 DIAGNOSIS — J45909 Unspecified asthma, uncomplicated: Secondary | ICD-10-CM | POA: Diagnosis present

## 2024-01-13 DIAGNOSIS — I447 Left bundle-branch block, unspecified: Secondary | ICD-10-CM | POA: Diagnosis present

## 2024-01-13 DIAGNOSIS — E1165 Type 2 diabetes mellitus with hyperglycemia: Secondary | ICD-10-CM | POA: Diagnosis present

## 2024-01-13 LAB — GLUCOSE, CAPILLARY
Glucose-Capillary: 206 mg/dL — ABNORMAL HIGH (ref 70–99)
Glucose-Capillary: 170 mg/dL — ABNORMAL HIGH (ref 70–99)
Glucose-Capillary: 152 mg/dL — ABNORMAL HIGH (ref 70–99)
Glucose-Capillary: 172 mg/dL — ABNORMAL HIGH (ref 70–99)

## 2024-01-13 LAB — URINALYSIS, ROUTINE W REFLEX MICROSCOPIC
Bilirubin Urine: NEGATIVE
Glucose, UA: NEGATIVE mg/dL
Hgb urine dipstick: NEGATIVE
Ketones, ur: NEGATIVE mg/dL
Nitrite: NEGATIVE
Protein, ur: NEGATIVE mg/dL
Specific Gravity, Urine: <1.005 — ABNORMAL LOW (ref 1.005–1.030)
pH: 6 (ref 5.0–8.0)

## 2024-01-13 LAB — BASIC METABOLIC PANEL WITH GFR
Anion gap: 11 (ref 5–15)
BUN: 12 mg/dL (ref 8–23)
CO2: 27 mmol/L (ref 22–32)
Calcium: 9.7 mg/dL (ref 8.9–10.3)
Chloride: 99 mmol/L (ref 98–111)
Creatinine, Ser: 0.74 mg/dL (ref 0.44–1.00)
GFR, Estimated: >60 mL/min (ref >60)
Glucose, Bld: 184 mg/dL — ABNORMAL HIGH (ref 70–99)
Potassium: 4.4 mmol/L (ref 3.5–5.1)
Sodium: 137 mmol/L (ref 135–145)

## 2024-01-13 LAB — CREATININE, SERUM
Creatinine, Ser: 0.79 mg/dL (ref 0.44–1.00)
GFR, Estimated: >60 mL/min (ref >60)

## 2024-01-13 LAB — CBC
HCT: 44.2 % (ref 36.0–46.0)
Hemoglobin: 14.4 g/dL (ref 12.0–15.0)
MCH: 30.7 pg (ref 26.0–34.0)
MCHC: 32.6 g/dL (ref 30.0–36.0)
MCV: 94.2 fL (ref 80.0–100.0)
Platelets: 372 K/uL (ref 150–400)
RBC: 4.69 MIL/uL (ref 3.87–5.11)
RDW: 13 % (ref 11.5–15.5)
WBC: 14.5 K/uL — ABNORMAL HIGH (ref 4.0–10.5)
nRBC: 0 % (ref 0.0–0.2)

## 2024-01-13 LAB — URINALYSIS, MICROSCOPIC (REFLEX)

## 2024-01-13 LAB — HEMOGLOBIN A1C
Hgb A1c MFr Bld: 7.2 % — ABNORMAL HIGH (ref 4.8–5.6)
Mean Plasma Glucose: 159.94 mg/dL

## 2024-01-13 LAB — HIV ANTIBODY (ROUTINE TESTING W REFLEX): HIV Screen 4th Generation wRfx: NONREACTIVE

## 2024-01-13 MED ORDER — ERYTHROMYCIN 5 MG/GM OP OINT
1.0000 | TOPICAL_OINTMENT | Freq: Every day | OPHTHALMIC | Status: DC
  Administered 2024-01-13 – 2024-01-17 (×5): 1 via OPHTHALMIC
  Filled 2024-01-13: qty 1

## 2024-01-13 MED ORDER — PANTOPRAZOLE SODIUM 40 MG IV SOLR
40.0000 mg | INTRAVENOUS | Status: DC
  Administered 2024-01-13 – 2024-01-18 (×6): 40 mg via INTRAVENOUS
  Filled 2024-01-13 (×6): qty 10

## 2024-01-13 MED ORDER — FENTANYL CITRATE (PF) 100 MCG/2ML IJ SOLN
INTRAMUSCULAR | Status: AC | PRN
  Administered 2024-01-13: 50 ug via INTRAVENOUS
  Administered 2024-01-13 (×2): 25 ug via INTRAVENOUS

## 2024-01-13 MED ORDER — METRONIDAZOLE 500 MG/100ML IV SOLN
500.0000 mg | Freq: Once | INTRAVENOUS | Status: DC
  Filled 2024-01-13: qty 100

## 2024-01-13 MED ORDER — ONDANSETRON HCL 4 MG PO TABS: 4.0000 mg | ORAL_TABLET | Freq: Four times a day (QID) | ORAL | Status: DC | PRN

## 2024-01-13 MED ORDER — CIPROFLOXACIN IN D5W 400 MG/200ML IV SOLN
400.0000 mg | Freq: Two times a day (BID) | INTRAVENOUS | Status: DC
  Administered 2024-01-13 – 2024-01-15 (×4): 400 mg via INTRAVENOUS
  Filled 2024-01-13 (×4): qty 200

## 2024-01-13 MED ORDER — FENTANYL CITRATE (PF) 100 MCG/2ML IJ SOLN
INTRAMUSCULAR | Status: AC
  Filled 2024-01-13: qty 4

## 2024-01-13 MED ORDER — LATANOPROST 0.005 % OP SOLN
1.0000 [drp] | Freq: Every day | OPHTHALMIC | Status: DC
  Administered 2024-01-13 – 2024-01-17 (×5): 1 [drp] via OPHTHALMIC
  Filled 2024-01-13: qty 2.5

## 2024-01-13 MED ORDER — LIDOCAINE HCL 1 % IJ SOLN
10.0000 mL | Freq: Once | INTRAMUSCULAR | Status: AC
  Administered 2024-01-13: 10 mL via INTRADERMAL

## 2024-01-13 MED ORDER — ESCITALOPRAM OXALATE 10 MG PO TABS
10.0000 mg | ORAL_TABLET | Freq: Every day | ORAL | Status: DC
  Administered 2024-01-13 – 2024-01-18 (×6): 10 mg via ORAL
  Filled 2024-01-13 (×6): qty 1

## 2024-01-13 MED ORDER — ALBUTEROL SULFATE (2.5 MG/3ML) 0.083% IN NEBU: 2.5000 mg | INHALATION_SOLUTION | Freq: Four times a day (QID) | RESPIRATORY_TRACT | Status: DC | PRN

## 2024-01-13 MED ORDER — INSULIN ASPART 100 UNIT/ML IJ SOLN
0.0000 [IU] | INTRAMUSCULAR | Status: DC
  Administered 2024-01-13 (×3): 2 [IU] via SUBCUTANEOUS
  Administered 2024-01-13: 3 [IU] via SUBCUTANEOUS
  Administered 2024-01-14 (×3): 2 [IU] via SUBCUTANEOUS

## 2024-01-13 MED ORDER — METRONIDAZOLE 500 MG/100ML IV SOLN
500.0000 mg | Freq: Two times a day (BID) | INTRAVENOUS | Status: DC
  Administered 2024-01-13 – 2024-01-16 (×7): 500 mg via INTRAVENOUS
  Filled 2024-01-13 (×7): qty 100

## 2024-01-13 MED ORDER — SODIUM CHLORIDE 0.9 % IV SOLN: INTRAVENOUS | Status: DC

## 2024-01-13 MED ORDER — ATORVASTATIN CALCIUM 20 MG PO TABS
20.0000 mg | ORAL_TABLET | Freq: Every day | ORAL | Status: DC
  Administered 2024-01-13 – 2024-01-18 (×6): 20 mg via ORAL
  Filled 2024-01-13 (×6): qty 1

## 2024-01-13 MED ORDER — LEVOTHYROXINE SODIUM 75 MCG PO TABS
75.0000 ug | ORAL_TABLET | Freq: Every day | ORAL | Status: DC
  Administered 2024-01-13 – 2024-01-18 (×6): 75 ug via ORAL
  Filled 2024-01-13 (×6): qty 1

## 2024-01-13 MED ORDER — ACETAMINOPHEN 650 MG RE SUPP: 650.0000 mg | Freq: Four times a day (QID) | RECTAL | Status: DC | PRN

## 2024-01-13 MED ORDER — ENOXAPARIN SODIUM 40 MG/0.4ML IJ SOSY
40.0000 mg | PREFILLED_SYRINGE | INTRAMUSCULAR | Status: DC
  Administered 2024-01-14 – 2024-01-18 (×5): 40 mg via SUBCUTANEOUS
  Filled 2024-01-13 (×6): qty 0.4

## 2024-01-13 MED ORDER — CIPROFLOXACIN IN D5W 400 MG/200ML IV SOLN
400.0000 mg | Freq: Once | INTRAVENOUS | Status: AC
  Administered 2024-01-13: 400 mg via INTRAVENOUS
  Filled 2024-01-13: qty 200

## 2024-01-13 MED ORDER — DOCUSATE SODIUM 100 MG PO CAPS
100.0000 mg | ORAL_CAPSULE | Freq: Two times a day (BID) | ORAL | Status: DC | PRN
  Administered 2024-01-13 – 2024-01-14 (×2): 100 mg via ORAL
  Filled 2024-01-13 (×3): qty 1

## 2024-01-13 MED ORDER — ALBUTEROL SULFATE HFA 108 (90 BASE) MCG/ACT IN AERS: 2.0000 | INHALATION_SPRAY | Freq: Four times a day (QID) | RESPIRATORY_TRACT | Status: DC | PRN

## 2024-01-13 MED ORDER — MORPHINE SULFATE (PF) 2 MG/ML IV SOLN
1.0000 mg | INTRAVENOUS | Status: DC | PRN
  Administered 2024-01-13 – 2024-01-14 (×4): 1 mg via INTRAVENOUS
  Filled 2024-01-13 (×6): qty 1

## 2024-01-13 MED ORDER — ACETAMINOPHEN 325 MG PO TABS
650.0000 mg | ORAL_TABLET | Freq: Four times a day (QID) | ORAL | Status: DC | PRN
  Administered 2024-01-13 – 2024-01-17 (×3): 650 mg via ORAL
  Filled 2024-01-13 (×3): qty 2

## 2024-01-13 MED ORDER — ONDANSETRON HCL 4 MG/2ML IJ SOLN
4.0000 mg | Freq: Four times a day (QID) | INTRAMUSCULAR | Status: DC | PRN
  Administered 2024-01-13: 4 mg via INTRAVENOUS
  Filled 2024-01-13: qty 2

## 2024-01-13 MED ORDER — LIDOCAINE HCL 1 % IJ SOLN
10.0000 mL | Freq: Once | INTRAMUSCULAR | Status: AC
Start: 2024-01-13 — End: 2024-01-13
  Administered 2024-01-13: 10 mL via INTRADERMAL

## 2024-01-13 MED ORDER — MIDAZOLAM HCL 2 MG/2ML IJ SOLN
INTRAMUSCULAR | Status: AC | PRN
  Administered 2024-01-13: 1 mg via INTRAVENOUS
  Administered 2024-01-13 (×2): .5 mg via INTRAVENOUS

## 2024-01-13 MED ORDER — MIDAZOLAM HCL 2 MG/2ML IJ SOLN
INTRAMUSCULAR | Status: AC
  Filled 2024-01-13: qty 2

## 2024-01-13 NOTE — Consult Note (Signed)
 Reason for Consult: Right lower quadrant abdominal pain, perforated appendicitis Referring Physician: Dr. Arlice Handing [place] is an 63 y.o. female.  HPI: Patient is a 63 year old white female with a history of asthma, hypertension, hypothyroidism, tracheal stenosis, and type 2 diabetes who has been having abdominal pain for approximately 1 week.  She was seen by her primary care physician and was told she was possibly dehydrated.  As she continued to have the abdominal pain, she presents to the emergency room due to increasing vomiting and nausea.  CT scan of the abdomen pelvis reveals perforated appendicitis with a right lower quadrant abscess.  Past Medical History:  Diagnosis Date   Allergic rhinitis    Anxiety    Asthma    Cancer (HCC)    skin cancer on left foot   Diabetes mellitus without complication (HCC)    Type ii   Dyspnea    GERD (gastroesophageal reflux disease)    History of blood transfusion 2019   w hip surgery   Hyperlipidemia    Hypertension    Hypothyroidism    Left bundle branch block    Tracheal stenosis     Past Surgical History:  Procedure Laterality Date   BREAST SURGERY Left    lumpectomy   CESAREAN SECTION     COLONOSCOPY     DILATION AND CURETTAGE OF UTERUS     FLEXIBLE BRONCHOSCOPY N/A 03/24/2018   Procedure: FLEXIBLE BRONCHOSCOPY;  Surgeon: Kerrin Elspeth BROCKS, MD;  Location: [facility] OR;  Service: Thoracic;  Laterality: N/A;   LASER BRONCHOSCOPY N/A 03/24/2018   Procedure: LASER BRONCHOSCOPY FOR RESECTION OF TRACHEAL WEB;  Surgeon: Kerrin Elspeth BROCKS, MD;  Location: [facility] OR;  Service: Thoracic;  Laterality: N/A;   MICROLARYNGOSCOPY N/A 10/28/2020   Procedure: MICROLARYNGOSCOPY WITH CO2 LASER & BALOON DILATION;  Surgeon: Jesus Oliphant, MD;  Location: [facility] OR;  Service: ENT;  Laterality: N/A;   MICROLARYNGOSCOPY WITH LASER AND BALLOON DILATION N/A 05/19/2021   Procedure: MICROLARYNGOSCOPY WITH CO2 LASER AND BALLOON DILATION;  Surgeon: Jesus Oliphant, MD;   Location:  [facility] OR;  Service: ENT;  Laterality: N/A;   MICROLARYNGOSCOPY WITH LASER AND BALLOON DILATION N/A 05/09/2022   Procedure: MICROLARYNGOSCOPY WITH LASER AND BALLOON DILATION;  Surgeon: Jesus Oliphant, MD;  Location: [facility] OR;  Service: ENT;  Laterality: N/A;   TONSILLECTOMY     TOTAL HIP ARTHROPLASTY Left 06/26/2018   Procedure: TOTAL HIP ARTHROPLASTY ANTERIOR APPROACH;  Surgeon: Fidel Rogue, MD;  Location: [facility] ORS;  Service: Orthopedics;  Laterality: Left;   VIDEO BRONCHOSCOPY Bilateral 02/03/2018   Procedure: VIDEO BRONCHOSCOPY WITHOUT FLUORO;  Surgeon: Shelah Lamar RAMAN, MD;  Location: [facility] ENDOSCOPY;  Service: Cardiopulmonary;  Laterality: Bilateral;   VIDEO BRONCHOSCOPY N/A 05/19/2018   Procedure: VIDEO BRONCHOSCOPY;  Surgeon: Kerrin Elspeth BROCKS, MD;  Location: [facility] OR;  Service: Thoracic;  Laterality: N/A;    Family History  Problem Relation Age of Onset   Glaucoma Father    Macular degeneration Maternal Grandmother     Social History:  reports that she has never smoked. She has never used smokeless tobacco. She reports current alcohol  use. She reports that she does not use drugs.  Allergies:  Allergies  Allergen Reactions   Penicillins Hives, Rash and Other (See Comments)    Has patient had a PCN reaction causing immediate rash, facial/tongue/throat swelling, SOB or lightheadedness with hypotension: No Has patient had a PCN reaction causing severe rash involving mucus membranes or skin necrosis: No Has patient had a PCN reaction that  required hospitalization: No Has patient had a PCN reaction occurring within the last 10 years: No If all of the above answers are NO, then may proceed with Cephalosporin use.     Medications: I have reviewed the patient's current medications. Prior to Admission:  Medications Prior to Admission  Medication Sig Dispense Refill Last Dose/Taking   albuterol  (VENTOLIN  HFA) 108 (90 Base) MCG/ACT inhaler Inhale 2 puffs into the lungs every 6 (six)  hours as needed for wheezing or shortness of breath.   Unknown   aspirin  EC 81 MG tablet Take 81 mg by mouth daily. Swallow whole.   Past Week   atorvastatin  (LIPITOR ) 20 MG tablet Take 20 mg by mouth daily.   Past Week   brimonidine  (ALPHAGAN ) 0.2 % ophthalmic solution Place 1 drop into both eyes 2 (two) times daily.   Taking   Cholecalciferol  (VITAMIN D3) 50 MCG (2000 UT) TABS Take 2,000 Units by mouth daily.   Past Week   dicyclomine (BENTYL) 10 MG capsule Take 10 mg by mouth 2 (two) times daily as needed for spasms. For 15 days   Past Week   escitalopram  (LEXAPRO ) 10 MG tablet Take 10 mg by mouth daily.    Past Week   latanoprost  (XALATAN ) 0.005 % ophthalmic solution Place 1 drop into both eyes at bedtime.   Past Week   levothyroxine  (SYNTHROID , LEVOTHROID) 75 MCG tablet Take 75 mcg by mouth daily before breakfast.   01/12/2024 Morning   metFORMIN  (GLUCOPHAGE ) 1000 MG tablet Take 1,000 mg by mouth 2 (two) times daily with a meal.   Past Week   ondansetron  (ZOFRAN -ODT) 4 MG disintegrating tablet Take 4 mg by mouth every 8 (eight) hours as needed for vomiting or nausea.   Past Week   pantoprazole  (PROTONIX ) 20 MG tablet Take 1 tablet by mouth once daily 90 tablet 3 01/12/2024 Morning   valsartan  (DIOVAN ) 160 MG tablet Take 1 tablet by mouth once daily 30 tablet 0 Past Week   blood glucose meter kit and supplies KIT Dispense based on patient and insurance preference. Use up to four times daily as directed. Dx: E11.9 1 each 0     Results for orders placed or performed during the hospital encounter of 01/12/24 (from the past 48 hours)  CBC with Differential     Status: Abnormal   Collection Time: 01/12/24 10:50 PM  Result Value Ref Range   WBC 15.7 (H) 4.0 - 10.5 K/uL   RBC 4.89 3.87 - 5.11 MIL/uL   Hemoglobin 15.4 (H) 12.0 - 15.0 g/dL   HCT 54.5 63.9 - 53.9 %   MCV 92.8 80.0 - 100.0 fL   MCH 31.5 26.0 - 34.0 pg   MCHC 33.9 30.0 - 36.0 g/dL   RDW 87.1 88.4 - 84.4 %   Platelets 407 (H) 150 -  400 K/uL   nRBC 0.0 0.0 - 0.2 %   Neutrophils Relative % 78 %   Neutro Abs 12.4 (H) 1.7 - 7.7 K/uL   Lymphocytes Relative 11 %   Lymphs Abs 1.7 0.7 - 4.0 K/uL   Monocytes Relative 5 %   Monocytes Absolute 0.8 0.1 - 1.0 K/uL   Eosinophils Relative 1 %   Eosinophils Absolute 0.2 0.0 - 0.5 K/uL   Basophils Relative 1 %   Basophils Absolute 0.1 0.0 - 0.1 K/uL   Immature Granulocytes 4 %   Abs Immature Granulocytes 0.58 (H) 0.00 - 0.07 K/uL    Comment: Performed at [facility], [address] [place], [place] [address]  Comprehensive metabolic panel     Status: Abnormal   Collection Time: 01/12/24 10:50 PM  Result Value Ref Range   Sodium 136 135 - 145 mmol/L   Potassium 4.5 3.5 - 5.1 mmol/L   Chloride 95 (L) 98 - 111 mmol/L   CO2 26 22 - 32 mmol/L   Glucose, Bld 204 (H) 70 - 99 mg/dL    Comment: Glucose reference range applies only to samples taken after fasting for at least 8 hours.   BUN 12 8 - 23 mg/dL   Creatinine, Ser 9.20 0.44 - 1.00 mg/dL   Calcium  10.3 8.9 - 10.3 mg/dL   Total Protein 7.8 6.5 - 8.1 g/dL   Albumin  3.3 (L) 3.5 - 5.0 g/dL   AST 22 15 - 41 U/L   ALT 44 0 - 44 U/L   Alkaline Phosphatase 101 38 - 126 U/L   Total Bilirubin 0.9 0.0 - 1.2 mg/dL   GFR, Estimated >39 >39 mL/min    Comment: (NOTE) Calculated using the CKD-EPI Creatinine Equation (2021)    Anion gap 15 5 - 15    Comment: Performed at [facility], [address], [place], [place] [address]  Lipase, blood     Status: None   Collection Time: 01/12/24 10:50 PM  Result Value Ref Range   Lipase 32 11 - 51 U/L    Comment: Performed at [facility], [address], [place], [place] [address]  Urinalysis, Routine w reflex microscopic -Urine, Clean Catch     Status: Abnormal   Collection Time: 01/13/24 12:40 AM  Result Value Ref Range   Color, Urine YELLOW YELLOW   APPearance CLEAR CLEAR   Specific Gravity, Urine <1.005 (L) 1.005 - 1.030   pH 6.0 5.0 - 8.0   Glucose, UA NEGATIVE NEGATIVE mg/dL   Hgb  urine dipstick NEGATIVE NEGATIVE   Bilirubin Urine NEGATIVE NEGATIVE   Ketones, ur NEGATIVE NEGATIVE mg/dL   Protein, ur NEGATIVE NEGATIVE mg/dL   Nitrite NEGATIVE NEGATIVE   Leukocytes,Ua SMALL (A) NEGATIVE    Comment: Performed at [facility], [address] [place], [place] [address]  Urinalysis, Microscopic (reflex)     Status: Abnormal   Collection Time: 01/13/24 12:40 AM  Result Value Ref Range   RBC / HPF 0-5 0 - 5 RBC/hpf   WBC, UA 0-5 0 - 5 WBC/hpf   Bacteria, UA RARE (A) NONE SEEN   Squamous Epithelial / HPF 0-5 0 - 5 /HPF    Comment: Performed at [facility], [address] [place], [place] [address]  Blood culture (routine x 2)     Status: None (Preliminary result)   Collection Time: 01/13/24 12:57 AM   Specimen: BLOOD RIGHT HAND  Result Value Ref Range   Specimen Description BLOOD RIGHT HAND    Special Requests      BOTTLES DRAWN AEROBIC AND ANAEROBIC Blood Culture adequate volume   Culture      NO GROWTH < 12 HOURS Performed at [facility], [address] [place], [place] [address]    Report Status PENDING   Blood culture (routine x 2)     Status: None (Preliminary result)   Collection Time: 01/13/24  1:06 AM   Specimen: BLOOD LEFT HAND  Result Value Ref Range   Specimen Description BLOOD LEFT HAND    Special Requests      BOTTLES DRAWN AEROBIC AND ANAEROBIC Blood Culture adequate volume   Culture      NO  GROWTH < 12 HOURS Performed at [facility], [address], [place], [place] [address]    Report Status PENDING   Glucose, capillary     Status: Abnormal   Collection Time: 01/13/24  4:03 AM  Result Value Ref Range   Glucose-Capillary 206 (H) 70 - 99 mg/dL    Comment: Glucose reference range applies only to samples taken after fasting for at least 8 hours.  CBC     Status: Abnormal   Collection Time: 01/13/24  4:39 AM  Result Value Ref Range   WBC 14.5 (H) 4.0 - 10.5 K/uL   RBC 4.69 3.87 - 5.11 MIL/uL   Hemoglobin 14.4 12.0 - 15.0 g/dL   HCT 55.7  63.9 - 53.9 %   MCV 94.2 80.0 - 100.0 fL   MCH 30.7 26.0 - 34.0 pg   MCHC 32.6 30.0 - 36.0 g/dL   RDW 86.9 88.4 - 84.4 %   Platelets 372 150 - 400 K/uL   nRBC 0.0 0.0 - 0.2 %    Comment: Performed at [facility], [address], [place], [place] [address]  Creatinine, serum     Status: None   Collection Time: 01/13/24  4:39 AM  Result Value Ref Range   Creatinine, Ser 0.79 0.44 - 1.00 mg/dL   GFR, Estimated >39 >39 mL/min    Comment: (NOTE) Calculated using the CKD-EPI Creatinine Equation (2021) Performed at [facility], [address], [place], [place] [address]   Basic metabolic panel     Status: Abnormal   Collection Time: 01/13/24  4:39 AM  Result Value Ref Range   Sodium 137 135 - 145 mmol/L   Potassium 4.4 3.5 - 5.1 mmol/L   Chloride 99 98 - 111 mmol/L   CO2 27 22 - 32 mmol/L   Glucose, Bld 184 (H) 70 - 99 mg/dL    Comment: Glucose reference range applies only to samples taken after fasting for at least 8 hours.   BUN 12 8 - 23 mg/dL   Creatinine, Ser 9.25 0.44 - 1.00 mg/dL   Calcium  9.7 8.9 - 10.3 mg/dL   GFR, Estimated >39 >39 mL/min    Comment: (NOTE) Calculated using the CKD-EPI Creatinine Equation (2021)    Anion gap 11 5 - 15    Comment: Performed at [facility], [address] [place], [place] [address]  Glucose, capillary     Status: Abnormal   Collection Time: 01/13/24  7:10 AM  Result Value Ref Range   Glucose-Capillary 170 (H) 70 - 99 mg/dL    Comment: Glucose reference range applies only to samples taken after fasting for at least 8 hours.   Comment 1 Notify RN    Comment 2 Document in Chart     CT ABDOMEN PELVIS W CONTRAST Result Date: 01/13/2024 CLINICAL DATA:  Right lower quadrant abdominal pain. Nausea, vomiting, and constipation for 1 week. Abnormal electrolytes with elevated liver enzymes. EXAM: CT ABDOMEN AND PELVIS WITH CONTRAST TECHNIQUE: Multidetector CT imaging of the abdomen and pelvis was performed using the standard protocol following bolus  administration of intravenous contrast. RADIATION DOSE REDUCTION: This exam was performed according to the departmental dose-optimization program which includes automated exposure control, adjustment of the mA and/or kV according to patient size and/or use of iterative reconstruction technique. CONTRAST:  OMNIPAQUE  IOHEXOL  300 MG/ML  SOLN COMPARISON:  CT chest 06/15/2020 FINDINGS: Lower chest: Mild dependent atelectasis in the lung bases. Hepatobiliary: Mild diffuse fatty infiltration of the liver. No focal lesions. Gallbladder  and bile ducts are normal. Pancreas: Unremarkable. No pancreatic ductal dilatation or surrounding inflammatory changes. Spleen: Normal in size without focal abnormality. Adrenals/Urinary Tract: Adrenal glands are unremarkable. Kidneys are normal, without renal calculi, focal lesion, or hydronephrosis. Bladder is unremarkable. Stomach/Bowel: Stomach is not abnormally distended no discrete wall thickening is identified. Fluid-filled mildly dilated small bowel loops with decompressed terminal ileum. This could be reactive or may indicate enteritis. There is a loculated collection in the right lower quadrant measuring 5.1 x 3.6 cm and containing surrounding stranding consistent with an abscess. This is posterior and medial to the cecum. The appendix is only partially visualized but appears to be leading into this collection consistent with acute appendicitis with appendiceal abscess. Adjacent small bowel wall thickening is likely reactive. Stool-filled colon without distention. Vascular/Lymphatic: No significant vascular findings are present. No enlarged abdominal or pelvic lymph nodes. Reproductive: Uterus and bilateral adnexa are unremarkable. Other: Small amount of free fluid in the abdomen and pelvis, likely reactive. No free air. Abdominal wall musculature appears intact. Musculoskeletal: Postoperative left hip arthroplasty. No acute bony abnormalities. IMPRESSION: 1. Right lower  quadrant abscess with adjacent stranding likely representing acute appendicitis with appendiceal abscess. 2. Mildly dilated fluid-filled small bowel with some small bowel wall thickening is likely reactive ileus but could also indicate enteritis. 3. Small amount of free fluid in the pelvis is likely reactive. 4. Mild fatty infiltration of the liver. Electronically Signed   By: Elsie Gravely M.D.   On: 01/13/2024 00:18    ROS:  Pertinent items are noted in HPI.  Blood pressure (!) 157/78, pulse 81, temperature 98.2 F (36.8 C), temperature source Oral, resp. rate 16, height 5' 1 (1.549 m), weight 85.5 kg, SpO2 94%. Physical Exam: Pleasant white female in no acute distress Head is normocephalic, atraumatic Lungs clear to auscultation with equal breath sounds bilaterally Heart examination reveals a regular rate and rhythm without S3, S4, murmurs Abdomen is soft with tenderness noted in the right lower quadrant to deep palpation.  No rigidity is noted.  CT scan images personally reviewed  Assessment/Plan: Impression: Acute appendicitis with perforation and resultant abscess Plan: Because she already has perforated appendicitis, acute surgical intervention is not warranted.  IR has been consulted for drainage of the abscess.  Continue IV antibiotics.  Will follow with you.  Oneil Budge 01/13/2024, 7:42 AM

## 2024-01-13 NOTE — Assessment & Plan Note (Signed)
 Will keep patient NPO except sips with meds and ice chips.   Supportive medical therapy with IV antibiotic therapy ciprofloxacin  and metronidazole .   (Patient allergic to penicillin).  Supportive IV fluids with isotonic saline at 100 ml per hr Follow up with general surgery and IR recommendations  Pain control with as needed morphine  and nausea control with as needed IV zofran .  Daily pantoprazole  IV

## 2024-01-13 NOTE — H&P (Signed)
 History and Physical    Patient: Caitlyn Anderson FMW:[id number] DOB: [birth year] DOA: 01/12/2024 DOS: the patient was seen and examined on 01/13/2024 PCP: Luetta Chew, MD  Patient coming from: Home  Chief Complaint:  Chief Complaint  Patient presents with   Emesis   Constipation   HPI: Caitlyn Anderson is a 63 y.o. female with medical history significant of asthma, T2DM, hypertension, hyperlipidemia, hypothyroidism, and tracheal stenosis who presented with abdominal pain.  Reported one week of abdominal discomfort, consistent in distention and dull type pain. About 6 days ago she was seen by her primary care provider, and she was told that her electrolytes were abnormal and liver enzymes were elevated, possibly due to dehydration. She was told to keep her self hydrated and if continue to have abdominal pain to come to the hospital.  Throughout the course of the following days her abdominal pain localized at the right lower quadrant and she started having intermittent vomiting. Very poor oral intake, chills and generalized weakness.  Today she had increased frequency vomiting, prompting her husband to bring her to the hospital.    She has been still able to take her oral medications.     Review of Systems: As mentioned in the history of present illness. All other systems reviewed and are negative. Past Medical History:  Diagnosis Date   Allergic rhinitis    Anxiety    Asthma    Cancer (HCC)    skin cancer on left foot   Diabetes mellitus without complication (HCC)    Type ii   Dyspnea    GERD (gastroesophageal reflux disease)    History of blood transfusion 2019   w hip surgery   Hyperlipidemia    Hypertension    Hypothyroidism    Left bundle branch block    Tracheal stenosis    Past Surgical History:  Procedure Laterality Date   BREAST SURGERY Left    lumpectomy   CESAREAN SECTION     COLONOSCOPY     DILATION AND CURETTAGE OF UTERUS     FLEXIBLE BRONCHOSCOPY N/A 03/24/2018    Procedure: FLEXIBLE BRONCHOSCOPY;  Surgeon: Kerrin Elspeth BROCKS, MD;  Location: [facility] OR;  Service: Thoracic;  Laterality: N/A;   LASER BRONCHOSCOPY N/A 03/24/2018   Procedure: LASER BRONCHOSCOPY FOR RESECTION OF TRACHEAL WEB;  Surgeon: Kerrin Elspeth BROCKS, MD;  Location: [facility] OR;  Service: Thoracic;  Laterality: N/A;   MICROLARYNGOSCOPY N/A 10/28/2020   Procedure: MICROLARYNGOSCOPY WITH CO2 LASER & BALOON DILATION;  Surgeon: Jesus Oliphant, MD;  Location: [facility] OR;  Service: ENT;  Laterality: N/A;   MICROLARYNGOSCOPY WITH LASER AND BALLOON DILATION N/A 05/19/2021   Procedure: MICROLARYNGOSCOPY WITH CO2 LASER AND BALLOON DILATION;  Surgeon: Jesus Oliphant, MD;  Location: [facility] OR;  Service: ENT;  Laterality: N/A;   MICROLARYNGOSCOPY WITH LASER AND BALLOON DILATION N/A 05/09/2022   Procedure: MICROLARYNGOSCOPY WITH LASER AND BALLOON DILATION;  Surgeon: Jesus Oliphant, MD;  Location: [facility] OR;  Service: ENT;  Laterality: N/A;   TONSILLECTOMY     TOTAL HIP ARTHROPLASTY Left 06/26/2018   Procedure: TOTAL HIP ARTHROPLASTY ANTERIOR APPROACH;  Surgeon: Fidel Rogue, MD;  Location: [facility] ORS;  Service: Orthopedics;  Laterality: Left;   VIDEO BRONCHOSCOPY Bilateral 02/03/2018   Procedure: VIDEO BRONCHOSCOPY WITHOUT FLUORO;  Surgeon: Shelah Lamar RAMAN, MD;  Location: [facility] ENDOSCOPY;  Service: Cardiopulmonary;  Laterality: Bilateral;   VIDEO BRONCHOSCOPY N/A 05/19/2018   Procedure: VIDEO BRONCHOSCOPY;  Surgeon: Kerrin Elspeth BROCKS, MD;  Location: [facility] OR;  Service: Thoracic;  Laterality: N/A;   Social History:  reports that she has never smoked. She has never used smokeless tobacco. She reports current alcohol  use. She reports that she does not use drugs.  Allergies  Allergen Reactions   Penicillins Hives, Rash and Other (See Comments)    Has patient had a PCN reaction causing immediate rash, facial/tongue/throat swelling, SOB or lightheadedness with hypotension: No Has patient had a PCN reaction causing severe rash  involving mucus membranes or skin necrosis: No Has patient had a PCN reaction that required hospitalization: No Has patient had a PCN reaction occurring within the last 10 years: No If all of the above answers are NO, then may proceed with Cephalosporin use.     Family History  Problem Relation Age of Onset   Glaucoma Father    Macular degeneration Maternal Grandmother     Prior to Admission medications   Medication Sig Start Date End Date Taking? Authorizing Provider  albuterol  (VENTOLIN  HFA) 108 (90 Base) MCG/ACT inhaler Inhale 2 puffs into the lungs every 6 (six) hours as needed for wheezing or shortness of breath.    [provider]  aspirin  EC 81 MG tablet Take 81 mg by mouth daily. Swallow whole.    [provider]  atorvastatin  (LIPITOR ) 20 MG tablet Take 20 mg by mouth daily.    [provider]  blood glucose meter kit and supplies KIT Dispense based on patient and insurance preference. Use up to four times daily as directed. Dx: E11.9 04/08/18   LoveSharlet RAMAN, PA-C  Cholecalciferol  (VITAMIN D3) 50 MCG (2000 UT) TABS Take 2,000 Units by mouth daily.    [provider]  clindamycin  (CLEOCIN ) 300 MG capsule Take 1 capsule (300 mg total) by mouth 3 (three) times daily. 05/09/22   Jesus Oliphant, MD  clindamycin  (CLEOCIN ) 300 MG capsule Take 1 capsule (300 mg total) by mouth 3 (three) times daily. 05/09/22   Jesus Oliphant, MD  erythromycin  ophthalmic ointment Place 1 application into both eyes at bedtime. 04/01/21   [provider]  escitalopram  (LEXAPRO ) 10 MG tablet Take 10 mg by mouth daily.     [provider]  latanoprost  (XALATAN ) 0.005 % ophthalmic solution Place 1 drop into both eyes at bedtime. 06/01/20   [provider]  levothyroxine  (SYNTHROID , LEVOTHROID) 75 MCG tablet Take 75 mcg by mouth daily before breakfast.    [provider]  metFORMIN  (GLUCOPHAGE ) 1000 MG tablet Take 1,000 mg by mouth 2 (two) times  daily with a meal.    [provider]  pantoprazole  (PROTONIX ) 20 MG tablet Take 1 tablet by mouth once daily 11/23/20   Byrum, Lamar RAMAN, MD  predniSONE  (STERAPRED UNI-PAK 21 TAB) 10 MG (21) TBPK tablet Take 6 tablets the first day, 4 tablets for 2 days following that, 3 tablets the following day, 2 tablets the following day, 1 tablet in the last 2 days. 05/09/22   Jesus Oliphant, MD  PRESCRIPTION MEDICATION Place 1 drop into the right eye in the morning, at noon, in the evening, and at bedtime. Compound at [employer]  losarten potassium 0.8 mg /ml    [provider]  sitaGLIPtin (JANUVIA) 100 MG tablet Take 100 mg by mouth daily.    [provider]  valsartan  (DIOVAN ) 160 MG tablet Take 1 tablet by mouth once daily 03/20/19   Shelah Lamar RAMAN, MD    Physical Exam: Vitals:   01/12/24 2156 01/12/24 2330  BP: (!) 151/95 (!) 143/91  Pulse: 86 78  Resp: 18 18  Temp: 98.1 F (36.7 C)   TempSrc: Oral   SpO2: 96% 91%  Weight: 87.5 kg   Height: 5' 1 (1.549 m)    BP (!) 164/98   Pulse 86   Temp 98.1 F (36.7 C) (Oral)   Resp 18   Ht 5' 1 (1.549 m)   Wt 87.5 kg   SpO2 95%   BMI 36.47 kg/m   Neurology awake and alert, deconditioned and ill looking appearing ENT with mild pallor with no icterus, dry mucous membranes  Cardiovascular with S1 and S2 present and regular with no gallops, rubs or murmurs Respiratory with no rales or wheezing, poor inspiratory effort Abdomen with mid distention, tender to deep palpation, more at the right lower quadrant with no rebound or guarding. Tympanic to percussion  No lower extremity edema.   Data Reviewed:   Na 136, K 4,5 Cl 95 bicarbonate 26 glucose 104 bun 12 cr 0,79  AST 22 ALT 44  Wbc 15,7 hgb 15,4 plt 407   CT abdomen and pelvis with right lower quadrant abscess with adjacent stranding likely represented acute appendicitis with appendiceal abscess.  Mildly dilated fluid filled small bowel with some  small bowel wall thickening is likely reactive ileus but could also indicated enteritis.  Small amount of free fluid in the pelvis is likely reactive.  Mild fatty infiltration of the liver.   Assessment and Plan: * Acute perforated appendicitis Will keep patient NPO except sips with meds and ice chips.   Supportive medical therapy with IV antibiotic therapy ciprofloxacin  and metronidazole .   (Patient allergic to penicillin).  Supportive IV fluids with isotonic saline at 100 ml per hr Follow up with general surgery and IR recommendations  Pain control with as needed morphine  and nausea control with as needed IV zofran .  Daily pantoprazole  IV   Essential hypertension Hold on valsartan  to avoid hypotension   Type 2 diabetes mellitus with hyperlipidemia (HCC) Continue glucose cover and monitoring with insulin  sliding scale.  Hold on oral hypoglycemic agents, sitagliptin and metformin    Continue atorvastatin    Asthma No signs of acute exacerbation  Continue bronchodilator therapy   Hypothyroidism Continue with levothyroxine    Depression Continue with escitalopram .     Advance Care Planning:   Code Status: Prior   Consults: surgery and IR per ED   Family Communication: I spoke with patient's husband at the bedside, we talked in detail about patient's condition, plan of care and prognosis and all questions were addressed.   Severity of Illness: The appropriate patient status for this patient is INPATIENT. Inpatient status is judged to be reasonable and necessary in order to provide the required intensity of service to ensure the patient's safety. The patient's presenting symptoms, physical exam findings, and initial radiographic and laboratory data in the context of their chronic comorbidities is felt to place them at high risk for further clinical deterioration. Furthermore, it is not anticipated that the patient will be medically stable for discharge from the hospital within 2  midnights of admission.   * I certify that at the point of admission it is my clinical judgment that the patient will require inpatient hospital care spanning beyond 2 midnights from the point of admission due to high intensity of service, high risk for further deterioration and high frequency of surveillance required.*  Author: Elidia Toribio Furnace, MD 01/13/2024 12:45 AM  For on call review [email or web address].

## 2024-01-13 NOTE — Plan of Care (Signed)
   Problem: Education: Goal: Knowledge of General Education information will improve Description Including pain rating scale, medication(s)/side effects and non-pharmacologic comfort measures Outcome: Progressing   Problem: Clinical Measurements: Goal: Will remain free from infection Outcome: Progressing   Problem: Clinical Measurements: Goal: Cardiovascular complication will be avoided Outcome: Progressing

## 2024-01-13 NOTE — Assessment & Plan Note (Signed)
Continue with escitalopram.  ?

## 2024-01-13 NOTE — Assessment & Plan Note (Signed)
 Continue with levothyroxine

## 2024-01-13 NOTE — Consult Note (Signed)
 Chief Complaint: Perforated appendix  Referring Provider(s): Oneil Budge  Supervising Physician: Philip Cornet  Patient Status: [facility] In patient  History of Present Illness: Caitlyn Anderson is a 63 y.o. 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  She has been complaining of abdominal pain for about a week.   She presented to the ED at [facility] yesterday with increasing vomiting and nausea.   CT scan showed a perforated appendix with a right lower quadrant abscess: There is a loculated collection in the right lower quadrant measuring 5.1 x 3.6 cm and containing surrounding stranding consistent with an abscess. This is posterior and medial to the cecum. The appendix is only partially visualized but appears to be leading into this collection consistent with acute appendicitis with appendiceal abscess.   IR is asked to evaluate her for drain placement.  She is NPO. Lovenox  held appropriately. Last dose ASA 2 days ago, but ok to proceed with non-organ puncture.   Denies CP, SOB, N/V, sore throat, HA, leg swelling. Endorses constipation, RLQ abd pain.    Patient is Full Code  Past Medical History:  Diagnosis Date   Allergic rhinitis    Anxiety    Asthma    Cancer (HCC)    skin cancer on left foot   Diabetes mellitus without complication (HCC)    Type ii   Dyspnea    GERD (gastroesophageal reflux disease)    History of blood transfusion 2019   w hip surgery   Hyperlipidemia    Hypertension    Hypothyroidism    Left bundle branch block    Tracheal stenosis     Past Surgical History:  Procedure Laterality Date   BREAST SURGERY Left    lumpectomy   CESAREAN SECTION     COLONOSCOPY     DILATION AND CURETTAGE OF UTERUS     FLEXIBLE BRONCHOSCOPY N/A 03/24/2018   Procedure: FLEXIBLE BRONCHOSCOPY;  Surgeon: Kerrin Elspeth BROCKS, MD;   Location: [facility] OR;  Service: Thoracic;  Laterality: N/A;   LASER BRONCHOSCOPY N/A 03/24/2018   Procedure: LASER BRONCHOSCOPY FOR RESECTION OF TRACHEAL WEB;  Surgeon: Kerrin Elspeth BROCKS, MD;  Location: [facility] OR;  Service: Thoracic;  Laterality: N/A;   MICROLARYNGOSCOPY N/A 10/28/2020   Procedure: MICROLARYNGOSCOPY WITH CO2 LASER & BALOON DILATION;  Surgeon: Jesus Oliphant, MD;  Location: [facility] OR;  Service: ENT;  Laterality: N/A;   MICROLARYNGOSCOPY WITH LASER AND BALLOON DILATION N/A 05/19/2021   Procedure: MICROLARYNGOSCOPY WITH CO2 LASER AND BALLOON DILATION;  Surgeon: Jesus Oliphant, MD;  Location: [facility] OR;  Service: ENT;  Laterality: N/A;   MICROLARYNGOSCOPY WITH LASER AND BALLOON DILATION N/A 05/09/2022   Procedure: MICROLARYNGOSCOPY WITH LASER AND BALLOON DILATION;  Surgeon: Jesus Oliphant, MD;  Location: [facility] OR;  Service: ENT;  Laterality: N/A;   TONSILLECTOMY     TOTAL HIP ARTHROPLASTY Left 06/26/2018   Procedure: TOTAL HIP ARTHROPLASTY ANTERIOR APPROACH;  Surgeon: Fidel Rogue, MD;  Location: [facility] ORS;  Service: Orthopedics;  Laterality: Left;   VIDEO BRONCHOSCOPY Bilateral 02/03/2018   Procedure: VIDEO BRONCHOSCOPY WITHOUT FLUORO;  Surgeon: Shelah Lamar RAMAN, MD;  Location: [facility] ENDOSCOPY;  Service: Cardiopulmonary;  Laterality: Bilateral;   VIDEO BRONCHOSCOPY N/A 05/19/2018   Procedure: VIDEO BRONCHOSCOPY;  Surgeon: Kerrin Elspeth BROCKS, MD;  Location: [facility] OR;  Service: Thoracic;  Laterality: N/A;    Allergies: Penicillins  Medications: Prior to Admission medications  Medication Sig Start Date End Date Taking? Authorizing Provider  albuterol  (VENTOLIN  HFA) 108 (90 Base) MCG/ACT inhaler Inhale 2 puffs into the lungs every 6 (six) hours as needed for wheezing or shortness of breath.   Yes [provider]  aspirin  EC 81 MG tablet Take 81 mg by mouth daily. Swallow whole.   Yes [provider]  atorvastatin  (LIPITOR ) 20 MG tablet Take 20 mg by mouth daily.   Yes [provider]  brimonidine  (ALPHAGAN ) 0.2 % ophthalmic solution Place 1 drop into both eyes 2 (two) times daily. 02/12/23  Yes [provider]  Cholecalciferol  (VITAMIN D3) 50 MCG (2000 UT) TABS Take 2,000 Units by mouth daily.   Yes [provider]  dicyclomine (BENTYL) 10 MG capsule Take 10 mg by mouth 2 (two) times daily as needed for spasms. For 15 days 01/06/24 01/21/24 Yes [provider]  escitalopram  (LEXAPRO ) 10 MG tablet Take 10 mg by mouth daily.    Yes [provider]  latanoprost  (XALATAN ) 0.005 % ophthalmic solution Place 1 drop into both eyes at bedtime. 06/01/20  Yes [provider]  levothyroxine  (SYNTHROID , LEVOTHROID) 75 MCG tablet Take 75 mcg by mouth daily before breakfast.   Yes [provider]  metFORMIN  (GLUCOPHAGE ) 1000 MG tablet Take 1,000 mg by mouth 2 (two) times daily with a meal.   Yes [provider]  ondansetron  (ZOFRAN -ODT) 4 MG disintegrating tablet Take 4 mg by mouth every 8 (eight) hours as needed for vomiting or nausea. 08/21/22  Yes [provider]  pantoprazole  (PROTONIX ) 20 MG tablet Take 1 tablet by mouth once daily 11/23/20  Yes Byrum, Lamar RAMAN, MD  valsartan  (DIOVAN ) 160 MG tablet Take 1 tablet by mouth once daily 03/20/19  Yes Byrum, Lamar RAMAN, MD  blood glucose meter kit and supplies KIT Dispense based on patient and insurance preference. Use up to four times daily as directed. Dx: E11.9 04/08/18   LoveSharlet RAMAN, PA-C     Family History  Problem Relation Age of Onset   Glaucoma Father    Macular degeneration Maternal Grandmother     Social History   Socioeconomic History   Marital status: Married    Spouse name: Not on file   Number of children: Not on file   Years of education: Not on file   Highest education level: Not on file  Occupational History   Not on file  Tobacco Use   Smoking status: Never   Smokeless tobacco: Never  Vaping Use   Vaping status: Never Used  Substance and  Sexual Activity   Alcohol  use: Yes    Comment: 1-2 times a year   Drug use: Never   Sexual activity: Not on file  Other Topics Concern   Not on file  Social History Narrative   Not on file   Social Drivers of Health   Financial Resource Strain: Not on file  Food Insecurity: No Food Insecurity (01/13/2024)   Hunger Vital Sign    Worried About Running Out of Food in the Last Year: Never true    Ran Out of Food in the Last Year: Never true  Transportation Needs: No Transportation Needs (01/13/2024)   PRAPARE - Administrator, Civil Service (Medical): No    Lack of Transportation (Non-Medical): No  Physical Activity: Not on file  Stress: Not on file  Social Connections: Not on file     Review of Systems: A 12 point ROS discussed and pertinent  positives are indicated in the HPI above.  All other systems are negative.    Vital Signs: BP 130/68 (BP Location: Left Arm)   Pulse 80   Temp 97.9 F (36.6 C) (Oral)   Resp 16   Ht 5' 1 (1.549 m)   Wt 188 lb 7.9 oz (85.5 kg)   SpO2 93%   BMI 35.62 kg/m   Advance Care Plan: The advanced care place/surrogate decision maker was discussed at the time of visit and the patient did not wish to discuss or was not able to name a surrogate decision maker or provide an advance care plan.  Physical Exam Vitals reviewed.  Constitutional:      Appearance: Normal appearance.  HENT:     Head: Normocephalic and atraumatic.     Mouth/Throat:     Mouth: Mucous membranes are moist.     Pharynx: Oropharynx is clear.  Eyes:     Extraocular Movements: Extraocular movements intact.  Cardiovascular:     Rate and Rhythm: Normal rate and regular rhythm.  Pulmonary:     Effort: Pulmonary effort is normal. No respiratory distress.     Breath sounds: Normal breath sounds.     Comments: Some mild airway turbulence noted without stridor. BS clear otherwise Abdominal:     General: Bowel sounds are normal.     Palpations: Abdomen is soft.      Tenderness: There is abdominal tenderness.     Comments: Lower abd, R>L  Musculoskeletal:        General: Normal range of motion.     Cervical back: Normal range of motion.  Skin:    General: Skin is warm and dry.  Neurological:     General: No focal deficit present.     Mental Status: She is alert and oriented to person, place, and time.  Psychiatric:        Mood and Affect: Mood normal.        Behavior: Behavior normal.        Thought Content: Thought content normal.        Judgment: Judgment normal.     Imaging: CT ABDOMEN PELVIS W CONTRAST Result Date: 01/13/2024 CLINICAL DATA:  Right lower quadrant abdominal pain. Nausea, vomiting, and constipation for 1 week. Abnormal electrolytes with elevated liver enzymes. EXAM: CT ABDOMEN AND PELVIS WITH CONTRAST TECHNIQUE: Multidetector CT imaging of the abdomen and pelvis was performed using the standard protocol following bolus administration of intravenous contrast. RADIATION DOSE REDUCTION: This exam was performed according to the departmental dose-optimization program which includes automated exposure control, adjustment of the mA and/or kV according to patient size and/or use of iterative reconstruction technique. CONTRAST:  OMNIPAQUE  IOHEXOL  300 MG/ML  SOLN COMPARISON:  CT chest 06/15/2020 FINDINGS: Lower chest: Mild dependent atelectasis in the lung bases. Hepatobiliary: Mild diffuse fatty infiltration of the liver. No focal lesions. Gallbladder and bile ducts are normal. Pancreas: Unremarkable. No pancreatic ductal dilatation or surrounding inflammatory changes. Spleen: Normal in size without focal abnormality. Adrenals/Urinary Tract: Adrenal glands are unremarkable. Kidneys are normal, without renal calculi, focal lesion, or hydronephrosis. Bladder is unremarkable. Stomach/Bowel: Stomach is not abnormally distended no discrete wall thickening is identified. Fluid-filled mildly dilated small bowel loops with decompressed terminal ileum.  This could be reactive or may indicate enteritis. There is a loculated collection in the right lower quadrant measuring 5.1 x 3.6 cm and containing surrounding stranding consistent with an abscess. This is posterior and medial to the cecum. The appendix is only  partially visualized but appears to be leading into this collection consistent with acute appendicitis with appendiceal abscess. Adjacent small bowel wall thickening is likely reactive. Stool-filled colon without distention. Vascular/Lymphatic: No significant vascular findings are present. No enlarged abdominal or pelvic lymph nodes. Reproductive: Uterus and bilateral adnexa are unremarkable. Other: Small amount of free fluid in the abdomen and pelvis, likely reactive. No free air. Abdominal wall musculature appears intact. Musculoskeletal: Postoperative left hip arthroplasty. No acute bony abnormalities. IMPRESSION: 1. Right lower quadrant abscess with adjacent stranding likely representing acute appendicitis with appendiceal abscess. 2. Mildly dilated fluid-filled small bowel with some small bowel wall thickening is likely reactive ileus but could also indicate enteritis. 3. Small amount of free fluid in the pelvis is likely reactive. 4. Mild fatty infiltration of the liver. Electronically Signed   By: Elsie Gravely M.D.   On: 01/13/2024 00:18    Labs:  CBC: Recent Labs    01/12/24 2250 01/13/24 0439  WBC 15.7* 14.5*  HGB 15.4* 14.4  HCT 45.4 44.2  PLT 407* 372    COAGS: No results for input(s): INR, APTT in the last 8760 hours.  BMP: Recent Labs    01/12/24 2250 01/13/24 0439  NA 136 137  K 4.5 4.4  CL 95* 99  CO2 26 27  GLUCOSE 204* 184*  BUN 12 12  CALCIUM  10.3 9.7  CREATININE 0.79 0.74  0.79  GFRNONAA >60 >60  >60    LIVER FUNCTION TESTS: Recent Labs    01/12/24 2250  BILITOT 0.9  AST 22  ALT 44  ALKPHOS 101  PROT 7.8  ALBUMIN  3.3*    TUMOR MARKERS: No results for input(s): AFPTM, CEA,  CA199, CHROMGRNA in the last 8760 hours.  Assessment and Plan:  Perforated appendix with RLQ abscess.  Images reviewed by Dr. Philip - will proceed with image guided drain placement.  Risks and benefits discussed with the patient including bleeding, infection, damage to adjacent structures, bowel perforation/fistula connection, and sepsis. She is aware that drain may remain in place for a few days or may be necessary for weeks or months if fistulization occurs.   All of the patient's questions were answered, patient is agreeable to proceed. Consent signed and in chart.    Electronically Signed: Laymon Coast, NP   I spent a total of 40 Minutes  in face to face in clinical consultation, greater than 50% of which was counseling/coordinating care for drain placement.

## 2024-01-13 NOTE — Assessment & Plan Note (Signed)
No signs of acute exacerbation. Continue bronchodilator therapy.

## 2024-01-13 NOTE — Progress Notes (Signed)
 PROGRESS NOTE  Caitlyn Anderson  DOB: 01/29/1961  PCP: Luetta Chew, MD FMW:[id number]  DOA: 01/12/2024  LOS: 0 days  Hospital Day: 2  Brief narrative: Caitlyn Anderson is a 63 y.o. female with PMH significant for DM2, HTN, HLD, obesity, hypothyroidism, GERD, tracheal stenosis 7/6, patient presented to the ED with complaint of nausea, vomiting, constipation, abdominal pain, poor oral tolerance for 1 week.  Was seen by PCP 6 days ago, reportedly noted elevated liver enzymes and abnormal electrolytes.  Recommended to self hydrate, monitor symptoms at home and if worsens present to the ED.  With worsening symptoms, patient presented to the ED  In the ED, patient was afebrile, blood pressure elevated to 150s, breathing on room air Labs with WBC count 14.5, renal function normal, glucose elevated 204  CT abdomen and pelvis showed  -Right lower quadrant abscess with adjacent stranding likely representing acute appendicitis with appendiceal abscess. -Mildly dilated fluid-filled small bowel with some small bowel wall thickening -reactive ileus versus enteritis.  EDP discussed with general surgery, recommended inpatient admission, IV antibiotics and IR consultation Blood culture was collected Admitted to [facility]  Subjective: Patient was seen and examined this morning. Pleasant middle-aged Caucasian female.  Not in distress. Chart reviewed Hemodynamically stable with blood pressure 150s Labs from this morning with WBC count 14.5  Earlier this afternoon, patient underwent CT-guided drain placement by IR at [facility]  25 mL of purulent fluid was removed.  Fluid sent for culture.  Assessment and plan: Acute appendicitis with abscess S/p CT-guided drain placement - IR 7/7 Presented with 5 days of abdominal symptoms  No fever, WBC count elevated.  Did not meet sepsis criteria CT abdomen as above Currently on IV antibiotics coverage with IV Cipro , IV Flagyl  General surgery recommended IR  consultation Earlier this afternoon, patient underwent CT-guided drain placement by IR at [facility].  25 mL of purulent fluid was removed.  Fluid sent for culture. Pain meds with IV morphine  PRN Recent Labs  Lab 01/12/24 2250 01/13/24 0439  WBC 15.7* 14.5*   Type 2 diabetes mellitus hyperglycemia A1c 6.3 from 2019.  Repeat A1c pending PTA meds-metformin  100 mg twice daily Initial blood sugar level was elevated over 200 Currently on every 4 hours SSI/Accu-Cheks while n.p.o. Recent Labs  Lab 01/13/24 0403 01/13/24 0710  GLUCAP 206* 170*   Essential hypertension PTA meds- valsartan  160 mg daily Currently on hold for infection and potential drop in blood pressure  Hyperlipidemia PTA meds- aspirin  and Lipitor  Both on hold Reportedly had abnormal LFTs with PCP 5 days ago.  Repeat LFTs here are normal Recent Labs  Lab 01/12/24 2250  AST 22  ALT 44  ALKPHOS 101  BILITOT 0.9  PROT 7.8  ALBUMIN  3.3*    Asthma No signs of acute exacerbation  Continue bronchodilator therapy    Hypothyroidism Continue with levothyroxine  75 mcg daily   Depression Continue with escitalopram  10 mg daily    Mobility: Independent at baseline  Goals of care   Code Status: Full Code     DVT prophylaxis:  enoxaparin  (LOVENOX ) injection 40 mg Start: 01/13/24 1000 SCDs Start: 01/13/24 0239   Antimicrobials: IV Cipro  and IV Flagyl  Fluid: NS at 100 mL/h Consultants: General Surgery, IR Family Communication: None at bedside  Status: Inpatient Level of care:  Med-Surg   Patient is from: Home Needs to continue in-hospital care: Underwent drain placement by IR today. Anticipated d/c to: Pending clinical course, ultimately home      Diet:  Diet Order             Diet heart healthy/carb modified Room service appropriate? Yes; Fluid consistency: Thin  Diet effective now                   Scheduled Meds:  atorvastatin   20 mg Oral Daily   enoxaparin  (LOVENOX ) injection  40 mg  Subcutaneous Q24H   erythromycin   1 Application Both Eyes QHS   escitalopram   10 mg Oral Daily   insulin  aspart  0-9 Units Subcutaneous Q4H   latanoprost   1 drop Both Eyes QHS   levothyroxine   75 mcg Oral QAC breakfast   pantoprazole  (PROTONIX ) IV  40 mg Intravenous Q24H    PRN meds: acetaminophen  **OR** acetaminophen , albuterol , morphine  injection, ondansetron  **OR** ondansetron  (ZOFRAN ) IV   Infusions:   sodium chloride  100 mL/hr at 01/13/24 0326   ciprofloxacin  400 mg (01/13/24 1516)   metronidazole  500 mg (01/13/24 0331)    Antimicrobials: Anti-infectives (From admission, onward)    Start     Dose/Rate Route Frequency Ordered Stop   01/13/24 1300  ciprofloxacin  (CIPRO ) IVPB 400 mg        400 mg 200 mL/hr over 60 Minutes Intravenous Every 12 hours 01/13/24 0238     01/13/24 0400  metroNIDAZOLE  (FLAGYL ) IVPB 500 mg        500 mg 100 mL/hr over 60 Minutes Intravenous Every 12 hours 01/13/24 0238     01/13/24 0030  ciprofloxacin  (CIPRO ) IVPB 400 mg        400 mg 200 mL/hr over 60 Minutes Intravenous  Once 01/13/24 0027 01/13/24 0206   01/13/24 0030  metroNIDAZOLE  (FLAGYL ) IVPB 500 mg  Status:  Discontinued        500 mg 100 mL/hr over 60 Minutes Intravenous  Once 01/13/24 0027 01/13/24 0246       Objective: Vitals:   01/13/24 0807 01/13/24 1445  BP: 130/68 128/75  Pulse: 80 76  Resp: 16   Temp: 97.9 F (36.6 C) 98 F (36.7 C)  SpO2: 93% 93%   No intake or output data in the 24 hours ending 01/13/24 1537 Filed Weights   01/12/24 2156 01/13/24 0200  Weight: 87.5 kg 85.5 kg   Weight change:  Body mass index is 35.62 kg/m.   Physical Exam this morning: General exam: Pleasant, middle-aged Caucasian female Skin: No rashes, lesions or ulcers. HEENT: Atraumatic, normocephalic, no obvious bleeding Lungs: Clear to auscultation bilaterally,  CVS: S1, S2, no murmur,   GI/Abd: Soft, tenderness present in right lower quadrant, nondistended, bowel sound present,    CNS: Alert, awake, oriented x 3 Psychiatry: Mood appropriate,  Extremities: No pedal edema, no calf tenderness,   Data Review: I have personally reviewed the laboratory data and studies available.  F/u labs  Unresulted Labs (From admission, onward)     Start     Ordered   01/20/24 0500  Creatinine, serum  (enoxaparin  (LOVENOX )    CrCl >/= 30 ml/min)  Weekly,   R     Comments: while on enoxaparin  therapy    01/13/24 0238   01/14/24 0500  Basic metabolic panel with GFR  Tomorrow morning,   R        01/13/24 0910   01/14/24 0500  CBC with Differential/Platelet  Tomorrow morning,   R        01/13/24 0910           Signed, Chapman Rota, MD [facility] 01/13/2024

## 2024-01-13 NOTE — Progress Notes (Signed)
   01/13/24 1024  TOC Brief Assessment  Insurance and Status Reviewed  Patient has primary care physician Yes  Home environment has been reviewed Single family home w/ spouse  Prior level of function: Independent  Prior/Current Home Services No current home services  Social Drivers of Health Review SDOH reviewed no interventions necessary  Readmission risk has been reviewed Yes  Transition of care needs no transition of care needs at this time

## 2024-01-13 NOTE — Procedures (Signed)
 Interventional Radiology Procedure:   Indications: RLQ abdominal abscess, presumed perforated appendicitis  Procedure: CT guided drain placement  Findings: 10 Fr drain placed in RLQ abscess. Removed 25 ml of purulent fluid  Complications: None     EBL: Minimal  Plan: Send fluid for culture.  Patient will [facility] to [facility].  Yazleen Molock R. Philip, MD  Pager: [phone]

## 2024-01-13 NOTE — Assessment & Plan Note (Signed)
 Hold on valsartan  to avoid hypotension

## 2024-01-13 NOTE — Assessment & Plan Note (Signed)
 Continue glucose cover and monitoring with insulin  sliding scale.  Hold on oral hypoglycemic agents, sitagliptin and metformin    Continue atorvastatin 

## 2024-01-14 DIAGNOSIS — K3532 Acute appendicitis with perforation and localized peritonitis, without abscess: Secondary | ICD-10-CM | POA: Diagnosis not present

## 2024-01-14 LAB — CBC WITH DIFFERENTIAL/PLATELET
Abs Immature Granulocytes: 0.23 K/uL — ABNORMAL HIGH (ref 0.00–0.07)
Basophils Absolute: 0.1 K/uL (ref 0.0–0.1)
Basophils Relative: 1 %
Eosinophils Absolute: 0.5 K/uL (ref 0.0–0.5)
Eosinophils Relative: 4 %
HCT: 40.1 % (ref 36.0–46.0)
Hemoglobin: 13.1 g/dL (ref 12.0–15.0)
Immature Granulocytes: 2 %
Lymphocytes Relative: 21 %
Lymphs Abs: 2.8 K/uL (ref 0.7–4.0)
MCH: 31.4 pg (ref 26.0–34.0)
MCHC: 32.7 g/dL (ref 30.0–36.0)
MCV: 96.2 fL (ref 80.0–100.0)
Monocytes Absolute: 0.9 K/uL (ref 0.1–1.0)
Monocytes Relative: 7 %
Neutro Abs: 8.4 K/uL — ABNORMAL HIGH (ref 1.7–7.7)
Neutrophils Relative %: 65 %
Platelets: 365 K/uL (ref 150–400)
RBC: 4.17 MIL/uL (ref 3.87–5.11)
RDW: 13.2 % (ref 11.5–15.5)
WBC: 12.9 K/uL — ABNORMAL HIGH (ref 4.0–10.5)
nRBC: 0 % (ref 0.0–0.2)

## 2024-01-14 LAB — BASIC METABOLIC PANEL WITH GFR
Anion gap: 7 (ref 5–15)
BUN: 11 mg/dL (ref 8–23)
CO2: 28 mmol/L (ref 22–32)
Calcium: 8.6 mg/dL — ABNORMAL LOW (ref 8.9–10.3)
Chloride: 101 mmol/L (ref 98–111)
Creatinine, Ser: 0.85 mg/dL (ref 0.44–1.00)
GFR, Estimated: >60 mL/min (ref >60)
Glucose, Bld: 138 mg/dL — ABNORMAL HIGH (ref 70–99)
Potassium: 4.6 mmol/L (ref 3.5–5.1)
Sodium: 136 mmol/L (ref 135–145)

## 2024-01-14 LAB — BLOOD CULTURE ID PANEL (REFLEXED) - BCID2

## 2024-01-14 LAB — GLUCOSE, CAPILLARY
Glucose-Capillary: 146 mg/dL — ABNORMAL HIGH (ref 70–99)
Glucose-Capillary: 147 mg/dL — ABNORMAL HIGH (ref 70–99)
Glucose-Capillary: 149 mg/dL — ABNORMAL HIGH (ref 70–99)
Glucose-Capillary: 174 mg/dL — ABNORMAL HIGH (ref 70–99)
Glucose-Capillary: 183 mg/dL — ABNORMAL HIGH (ref 70–99)
Glucose-Capillary: 186 mg/dL — ABNORMAL HIGH (ref 70–99)

## 2024-01-14 MED ORDER — INSULIN ASPART 100 UNIT/ML IJ SOLN
0.0000 [IU] | Freq: Three times a day (TID) | INTRAMUSCULAR | Status: DC
  Administered 2024-01-14 (×2): 2 [IU] via SUBCUTANEOUS
  Administered 2024-01-15: 1 [IU] via SUBCUTANEOUS
  Administered 2024-01-15 (×2): 2 [IU] via SUBCUTANEOUS
  Administered 2024-01-16 (×2): 1 [IU] via SUBCUTANEOUS
  Administered 2024-01-16: 2 [IU] via SUBCUTANEOUS
  Administered 2024-01-17 (×2): 1 [IU] via SUBCUTANEOUS
  Administered 2024-01-17: 2 [IU] via SUBCUTANEOUS
  Administered 2024-01-18: 1 [IU] via SUBCUTANEOUS

## 2024-01-14 MED ORDER — INSULIN ASPART 100 UNIT/ML IJ SOLN: 0.0000 [IU] | Freq: Every day | INTRAMUSCULAR | Status: DC

## 2024-01-14 MED ORDER — POLYETHYLENE GLYCOL 3350 17 G PO PACK
17.0000 g | PACK | Freq: Once | ORAL | Status: AC
  Administered 2024-01-14: 17 g via ORAL
  Filled 2024-01-14: qty 1

## 2024-01-14 NOTE — Progress Notes (Signed)
 Subjective: Patient has some soreness in the right lower quadrant after her drain site.  Otherwise she feels okay.  Objective: Vital signs in last 24 hours: Temp:  [97.8 F (36.6 C)-98.4 F (36.9 C)] 98.4 F (36.9 C) (07/08 0443) Pulse Rate:  [71-82] 81 (07/08 0443) Resp:  [13-20] 16 (07/08 0443) BP: (104-142)/(73-89) 124/79 (07/08 0443) SpO2:  [93 %-99 %] 95 % (07/08 0443) Last BM Date : 01/12/24  Intake/Output from previous day: 07/07 0701 - 07/08 0700 In: 1400.2 [P.O.:240; I.V.:560.2; IV Piggyback:600] Out: 20 [Drains:20] Intake/Output this shift: No intake/output data recorded.  General appearance: alert, cooperative, and no distress GI: Soft without rigidity.  Drain site in right lower quadrant with cloudy yellow drainage.  Lab Results:  Recent Labs    01/13/24 0439 01/14/24 0355  WBC 14.5* 12.9*  HGB 14.4 13.1  HCT 44.2 40.1  PLT 372 365   BMET Recent Labs    01/13/24 0439 01/14/24 0355  NA 137 136  K 4.4 4.6  CL 99 101  CO2 27 28  GLUCOSE 184* 138*  BUN 12 11  CREATININE 0.74  0.79 0.85  CALCIUM  9.7 8.6*   PT/INR No results for input(s): LABPROT, INR in the last 72 hours.  Studies/Results: CT GUIDED PERITONEAL/RETROPERITONEAL FLUID DRAIN BY PERC CATH Result Date: 01/13/2024 INDICATION: 63 year old with a right lower quadrant abscess and suggestive for a perforated appendicitis. Patient presents for image guided drainage of the right lower quadrant abdominal abscess. EXAM: CT-GUIDED DRAINAGE OF RIGHT LOWER QUADRANT ABDOMINAL ABSCESS TECHNIQUE: Multidetector CT imaging of the abdomen was performed following the standard protocol without IV contrast. RADIATION DOSE REDUCTION: This exam was performed according to the departmental dose-optimization program which includes automated exposure control, adjustment of the mA and/or kV according to patient size and/or use of iterative reconstruction technique. MEDICATIONS: Moderate sedation  ANESTHESIA/SEDATION: Moderate (conscious) sedation was employed during this procedure. A total of Versed  2 mg and Fentanyl  100 mcg was administered intravenously by the radiology nurse. Total intra-service moderate Sedation Time: 24 minutes. The patient's level of consciousness and vital signs were monitored continuously by radiology nursing throughout the procedure under my direct supervision. COMPLICATIONS: None immediate. PROCEDURE: Informed written consent was obtained from the patient after a thorough discussion of the procedural risks, benefits and alternatives. All questions were addressed. Maximal Sterile Barrier Technique was utilized including caps, mask, sterile gowns, sterile gloves, sterile drape, hand hygiene and skin antiseptic. A timeout was performed prior to the initiation of the procedure. Patient was placed on the CT scanner. CT images were obtained with the right side slightly elevated. Right lower quadrant was prepped with chlorhexidine  and sterile field was created. Skin was anesthetized using 1% lidocaine . Small incision was made. Using CT guidance, an 18 gauge trocar needle was directed into the right lower quadrant abscess and yellow purulent fluid was aspirated. Superstiff Amplatz wire was advanced into the collection. The tract was dilated to accommodate 10 [place] multipurpose drain. 25 mL of purulent fluid was removed. Drain was sutured to skin and attached to a suction bulb. Dressing was placed. FINDINGS: 25 mL of yellow purulent fluid was removed from the right lower quadrant abscess. IMPRESSION: CT-guided placement of a drainage catheter in the right lower quadrant abscess. Electronically Signed   By: Juliene Balder M.D.   On: 01/13/2024 21:31   CT ABDOMEN PELVIS W CONTRAST Result Date: 01/13/2024 CLINICAL DATA:  Right lower quadrant abdominal pain. Nausea, vomiting, and constipation for 1 week. Abnormal electrolytes with elevated liver enzymes.  EXAM: CT ABDOMEN AND PELVIS WITH CONTRAST  TECHNIQUE: Multidetector CT imaging of the abdomen and pelvis was performed using the standard protocol following bolus administration of intravenous contrast. RADIATION DOSE REDUCTION: This exam was performed according to the departmental dose-optimization program which includes automated exposure control, adjustment of the mA and/or kV according to patient size and/or use of iterative reconstruction technique. CONTRAST:  OMNIPAQUE  IOHEXOL  300 MG/ML  SOLN COMPARISON:  CT chest 06/15/2020 FINDINGS: Lower chest: Mild dependent atelectasis in the lung bases. Hepatobiliary: Mild diffuse fatty infiltration of the liver. No focal lesions. Gallbladder and bile ducts are normal. Pancreas: Unremarkable. No pancreatic ductal dilatation or surrounding inflammatory changes. Spleen: Normal in size without focal abnormality. Adrenals/Urinary Tract: Adrenal glands are unremarkable. Kidneys are normal, without renal calculi, focal lesion, or hydronephrosis. Bladder is unremarkable. Stomach/Bowel: Stomach is not abnormally distended no discrete wall thickening is identified. Fluid-filled mildly dilated small bowel loops with decompressed terminal ileum. This could be reactive or may indicate enteritis. There is a loculated collection in the right lower quadrant measuring 5.1 x 3.6 cm and containing surrounding stranding consistent with an abscess. This is posterior and medial to the cecum. The appendix is only partially visualized but appears to be leading into this collection consistent with acute appendicitis with appendiceal abscess. Adjacent small bowel wall thickening is likely reactive. Stool-filled colon without distention. Vascular/Lymphatic: No significant vascular findings are present. No enlarged abdominal or pelvic lymph nodes. Reproductive: Uterus and bilateral adnexa are unremarkable. Other: Small amount of free fluid in the abdomen and pelvis, likely reactive. No free air. Abdominal wall musculature appears  intact. Musculoskeletal: Postoperative left hip arthroplasty. No acute bony abnormalities. IMPRESSION: 1. Right lower quadrant abscess with adjacent stranding likely representing acute appendicitis with appendiceal abscess. 2. Mildly dilated fluid-filled small bowel with some small bowel wall thickening is likely reactive ileus but could also indicate enteritis. 3. Small amount of free fluid in the pelvis is likely reactive. 4. Mild fatty infiltration of the liver. Electronically Signed   By: Elsie Gravely M.D.   On: 01/13/2024 00:18    Anti-infectives: Anti-infectives (From admission, onward)    Start     Dose/Rate Route Frequency Ordered Stop   01/13/24 1300  ciprofloxacin  (CIPRO ) IVPB 400 mg        400 mg 200 mL/hr over 60 Minutes Intravenous Every 12 hours 01/13/24 0238     01/13/24 0400  metroNIDAZOLE  (FLAGYL ) IVPB 500 mg        500 mg 100 mL/hr over 60 Minutes Intravenous Every 12 hours 01/13/24 0238     01/13/24 0030  ciprofloxacin  (CIPRO ) IVPB 400 mg        400 mg 200 mL/hr over 60 Minutes Intravenous  Once 01/13/24 0027 01/13/24 0206   01/13/24 0030  metroNIDAZOLE  (FLAGYL ) IVPB 500 mg  Status:  Discontinued        500 mg 100 mL/hr over 60 Minutes Intravenous  Once 01/13/24 0027 01/13/24 0246       Assessment/Plan: Impression: Status post percutaneous drainage of appendiceal abscess.  Initial cultures show gram-negative rods and gram-positive cocci.  She did have a positive blood culture, 1 out of 2 with gram-positive cocci.  ID is pending. Plan: No need for acute surgical intervention.  Any consideration for interval appendectomy would be done in 6 weeks.  Will defer to the hospitalist concerning the positive blood culture.  Patient should have a follow-up appointment with IR drain clinic upon discharge.  LOS: 1 day  Oneil Budge 01/14/2024

## 2024-01-14 NOTE — Progress Notes (Signed)
 PROGRESS NOTE  Caitlyn Anderson  DOB: 1961-06-22  PCP: Luetta Chew, MD FMW:[id number]  DOA: 01/12/2024  LOS: 1 day  Hospital Day: 3  Brief narrative: Caitlyn Anderson is a 63 y.o. female with PMH significant for DM2, HTN, HLD, obesity, hypothyroidism, GERD, tracheal stenosis 7/6, patient presented to the ED with complaint of nausea, vomiting, constipation, abdominal pain, poor oral tolerance for 1 week.  Was seen by PCP 6 days ago, reportedly noted elevated liver enzymes and abnormal electrolytes.  Recommended to self hydrate, monitor symptoms at home and if worsens present to the ED.  With worsening symptoms, patient presented to the ED  In the ED, patient was afebrile, blood pressure elevated to 150s, breathing on room air Labs with WBC count 14.5, renal function normal, glucose elevated 204  CT abdomen and pelvis showed  -Right lower quadrant abscess with adjacent stranding likely representing acute appendicitis with appendiceal abscess. -Mildly dilated fluid-filled small bowel with some small bowel wall thickening -reactive ileus versus enteritis.  EDP discussed with general surgery, recommended inpatient admission, IV antibiotics and IR consultation Blood culture was collected Admitted to [facility] 7/7, underwent CT-guided drain placement by IR at [facility].  25 mL of purulent fluid was removed.  Fluid sent for culture.  Subjective: Patient was seen and examined this morning.  Lying down in bed.  Feels weak. Hemodynamically stable. Labs from this morning shows WBC count better at 12.9 Blood culture is growing gram-positive cocci in aerobic bottle only, pending identification Fluid sent by IR is growing abundant gram-negative rods and few gram-positive cocci, pending administration  Assessment and plan: Acute appendicitis with abscess S/p CT-guided drain placement - IR 7/7 Presented with 5 days of abdominal symptoms  No fever, WBC count elevated.  Did not meet sepsis criteria CT  abdomen as above showing appendiceal abscess General surgery recommended IR consultation 7/7, patient underwent CT-guided drain placement by IR at [facility].  25 mL of purulent fluid was removed.  Fluid sent by IR is growing abundant gram-negative rods and few gram-positive cocci, pending administration. No fever, WBC count gradually improving. Continue IV ciprofloxacin  and IV Flagyl  for now. Pain meds with IV morphine  PRN Recent Labs  Lab 01/12/24 2250 01/13/24 0439 01/14/24 0355  WBC 15.7* 14.5* 12.9*   Positive blood culture Blood culture is growing gram-positive cocci in aerobic bottle only, pending identification Likely contamination.  Type 2 diabetes mellitus hyperglycemia A1c 7.2 on 01/13/2024 PTA meds-metformin  100 mg twice daily, currently on hold Continue SSI/Accu-Cheks. Recent Labs  Lab 01/13/24 1643 01/13/24 1955 01/14/24 0012 01/14/24 0438 01/14/24 0719  GLUCAP 152* 172* 174* 149* 146*   Essential hypertension PTA meds- valsartan  160 mg daily Currently on hold.  Blood pressure in normal range.  Continue to monitor  Hyperlipidemia PTA meds- aspirin  and Lipitor  Aspirin  on hold.   Reportedly had abnormal LFTs with PCP 5 days ago.  Repeat LFTs here are normal. Continue Lipitor . Recent Labs  Lab 01/12/24 2250  AST 22  ALT 44  ALKPHOS 101  BILITOT 0.9  PROT 7.8  ALBUMIN  3.3*    Asthma No signs of acute exacerbation  Continue bronchodilator therapy    Hypothyroidism Continue with levothyroxine  75 mcg daily   Depression Continue with escitalopram  10 mg daily    Mobility: Independent at baseline.  Encourage ambulation  Goals of care   Code Status: Full Code     DVT prophylaxis:  enoxaparin  (LOVENOX ) injection 40 mg Start: 01/13/24 1000 SCDs Start: 01/13/24 0239  Antimicrobials: IV Cipro  and IV Flagyl  Fluid:  Can stop IV fluid today Consultants: General Surgery, IR Family Communication: None at bedside  Status: Inpatient Level of care:   Med-Surg   Patient is from: Home Needs to continue in-hospital care: Underwent drain placement by IR yesterday.  Improving WBC.  Pending culture report Anticipated d/c to: Pending clinical course, ultimately home    Diet:  Diet Order             Diet heart healthy/carb modified Room service appropriate? Yes; Fluid consistency: Thin  Diet effective now                   Scheduled Meds:  atorvastatin   20 mg Oral Daily   enoxaparin  (LOVENOX ) injection  40 mg Subcutaneous Q24H   erythromycin   1 Application Both Eyes QHS   escitalopram   10 mg Oral Daily   insulin  aspart  0-5 Units Subcutaneous QHS   insulin  aspart  0-9 Units Subcutaneous TID WC   latanoprost   1 drop Both Eyes QHS   levothyroxine   75 mcg Oral QAC breakfast   pantoprazole  (PROTONIX ) IV  40 mg Intravenous Q24H   polyethylene glycol  17 g Oral Once    PRN meds: acetaminophen  **OR** acetaminophen , albuterol , docusate sodium , morphine  injection, ondansetron  **OR** ondansetron  (ZOFRAN ) IV   Infusions:   ciprofloxacin  400 mg (01/13/24 2347)   metronidazole  500 mg (01/14/24 0555)    Antimicrobials: Anti-infectives (From admission, onward)    Start     Dose/Rate Route Frequency Ordered Stop   01/13/24 1300  ciprofloxacin  (CIPRO ) IVPB 400 mg        400 mg 200 mL/hr over 60 Minutes Intravenous Every 12 hours 01/13/24 0238     01/13/24 0400  metroNIDAZOLE  (FLAGYL ) IVPB 500 mg        500 mg 100 mL/hr over 60 Minutes Intravenous Every 12 hours 01/13/24 0238     01/13/24 0030  ciprofloxacin  (CIPRO ) IVPB 400 mg        400 mg 200 mL/hr over 60 Minutes Intravenous  Once 01/13/24 0027 01/13/24 0206   01/13/24 0030  metroNIDAZOLE  (FLAGYL ) IVPB 500 mg  Status:  Discontinued        500 mg 100 mL/hr over 60 Minutes Intravenous  Once 01/13/24 0027 01/13/24 0246       Objective: Vitals:   01/13/24 1951 01/14/24 0443  BP: 131/73 124/79  Pulse: 73 81  Resp: 18 16  Temp: 97.8 F (36.6 C) 98.4 F (36.9 C)  SpO2:  96% 95%    Intake/Output Summary (Last 24 hours) at 01/14/2024 0950 Last data filed at 01/14/2024 0700 Gross per 24 hour  Intake 1400.2 ml  Output 20 ml  Net 1380.2 ml   Filed Weights   01/12/24 2156 01/13/24 0200  Weight: 87.5 kg 85.5 kg   Weight change:  Body mass index is 35.62 kg/m.   Physical Exam this morning: General exam: Pleasant, middle-aged Caucasian female.  Feels a little weak.  Not in distress Skin: No rashes, lesions or ulcers. HEENT: Atraumatic, normocephalic, no obvious bleeding Lungs: Clear to auscultation bilaterally,  CVS: S1, S2, no murmur,   GI/Abd: Soft, has a JP drain on the right lower quadrant, nondistended, bowel sound present. CNS: Alert, awake, oriented x 3 Psychiatry: Mood appropriate,  Extremities: No pedal edema, no calf tenderness,   Data Review: I have personally reviewed the laboratory data and studies available.  F/u labs  [employer] (From admission, onward)     Start  Ordered   01/20/24 0500  Creatinine, serum  (enoxaparin  (LOVENOX )    CrCl >/= 30 ml/min)  Weekly,   R     Comments: while on enoxaparin  therapy    01/13/24 0238   01/15/24 0500  Basic metabolic panel with GFR  Tomorrow morning,   R        01/14/24 0950   01/15/24 0500  CBC with Differential/Platelet  Tomorrow morning,   R        01/14/24 0950           Signed, Chapman Rota, MD [facility] 01/14/2024

## 2024-01-14 NOTE — Progress Notes (Signed)
 Lab called with aerobic +gram cocci result from last blood culture draw.

## 2024-01-14 NOTE — Plan of Care (Signed)

## 2024-01-15 DIAGNOSIS — K3532 Acute appendicitis with perforation and localized peritonitis, without abscess: Secondary | ICD-10-CM | POA: Diagnosis not present

## 2024-01-15 LAB — GLUCOSE, CAPILLARY
Glucose-Capillary: 152 mg/dL — ABNORMAL HIGH (ref 70–99)
Glucose-Capillary: 193 mg/dL — ABNORMAL HIGH (ref 70–99)
Glucose-Capillary: 131 mg/dL — ABNORMAL HIGH (ref 70–99)
Glucose-Capillary: 176 mg/dL — ABNORMAL HIGH (ref 70–99)

## 2024-01-15 LAB — CBC WITH DIFFERENTIAL/PLATELET
Abs Immature Granulocytes: 0.33 K/uL — ABNORMAL HIGH (ref 0.00–0.07)
Basophils Absolute: 0.1 K/uL (ref 0.0–0.1)
Basophils Relative: 1 %
Eosinophils Absolute: 0.4 K/uL (ref 0.0–0.5)
Eosinophils Relative: 3 %
HCT: 39.2 % (ref 36.0–46.0)
Hemoglobin: 13 g/dL (ref 12.0–15.0)
Immature Granulocytes: 3 %
Lymphocytes Relative: 28 %
Lymphs Abs: 3.4 K/uL (ref 0.7–4.0)
MCH: 31.7 pg (ref 26.0–34.0)
MCHC: 33.2 g/dL (ref 30.0–36.0)
MCV: 95.6 fL (ref 80.0–100.0)
Monocytes Absolute: 0.9 K/uL (ref 0.1–1.0)
Monocytes Relative: 7 %
Neutro Abs: 7.2 K/uL (ref 1.7–7.7)
Neutrophils Relative %: 58 %
Platelets: 355 K/uL (ref 150–400)
RBC: 4.1 MIL/uL (ref 3.87–5.11)
RDW: 13 % (ref 11.5–15.5)
WBC: 12.4 K/uL — ABNORMAL HIGH (ref 4.0–10.5)
nRBC: 0 % (ref 0.0–0.2)

## 2024-01-15 LAB — BASIC METABOLIC PANEL WITH GFR
Anion gap: 9 (ref 5–15)
BUN: 13 mg/dL (ref 8–23)
CO2: 28 mmol/L (ref 22–32)
Calcium: 8.9 mg/dL (ref 8.9–10.3)
Chloride: 101 mmol/L (ref 98–111)
Creatinine, Ser: 0.83 mg/dL (ref 0.44–1.00)
GFR, Estimated: >60 mL/min (ref >60)
Glucose, Bld: 145 mg/dL — ABNORMAL HIGH (ref 70–99)
Potassium: 4.4 mmol/L (ref 3.5–5.1)
Sodium: 138 mmol/L (ref 135–145)

## 2024-01-15 MED ORDER — HYDROCODONE-ACETAMINOPHEN 5-325 MG PO TABS
1.0000 | ORAL_TABLET | Freq: Four times a day (QID) | ORAL | Status: DC | PRN
  Administered 2024-01-15 – 2024-01-16 (×4): 1 via ORAL
  Filled 2024-01-15 (×4): qty 1

## 2024-01-15 MED ORDER — SODIUM CHLORIDE 0.9 % IV SOLN
2.0000 g | INTRAVENOUS | Status: DC
  Administered 2024-01-15: 2 g via INTRAVENOUS
  Filled 2024-01-15 (×2): qty 20

## 2024-01-15 MED ORDER — BRIMONIDINE TARTRATE 0.2 % OP SOLN
1.0000 [drp] | Freq: Two times a day (BID) | OPHTHALMIC | Status: DC
  Administered 2024-01-15 – 2024-01-18 (×6): 1 [drp] via OPHTHALMIC
  Filled 2024-01-15: qty 5

## 2024-01-15 NOTE — Plan of Care (Signed)
  Problem: Clinical Measurements: Goal: Will remain free from infection Outcome: Progressing Goal: Diagnostic test results will improve Outcome: Progressing   Problem: Nutrition: Goal: Adequate nutrition will be maintained Outcome: Progressing   Problem: Pain Managment: Goal: General experience of comfort will improve and/or be controlled Outcome: Progressing   Problem: Fluid Volume: Goal: Ability to maintain a balanced intake and output will improve Outcome: Progressing   Problem: Metabolic: Goal: Ability to maintain appropriate glucose levels will improve Outcome: Progressing   Problem: Nutritional: Goal: Maintenance of adequate nutrition will improve Outcome: Progressing Goal: Progress toward achieving an optimal weight will improve Outcome: Progressing

## 2024-01-15 NOTE — Progress Notes (Signed)
 Ate over half of breakfast with no increased pain.  Asked for pain medication for when she starts to move around and walk.  Last bm was yesterday and was normal.

## 2024-01-15 NOTE — Progress Notes (Signed)
 Subjective: Having mild abdominal pain at drain site.  Objective: Vital signs in last 24 hours: Temp:  [97.4 F (36.3 C)-98.3 F (36.8 C)] 97.4 F (36.3 C) (07/09 0510) Pulse Rate:  [70-82] 70 (07/09 0510) Resp:  [15-20] 15 (07/09 0510) BP: (123-127)/(68-79) 124/72 (07/09 0510) SpO2:  [92 %-95 %] 92 % (07/09 0510) Last BM Date : 01/14/24  Intake/Output from previous day: 07/08 0701 - 07/09 0700 In: 1200 [P.O.:1200] Out: -  Intake/Output this shift: Total I/O In: -  Out: 5 [Drains:5]  General appearance: alert, cooperative, and no distress GI: Soft, no rigidity noted.  JP drainage minimal and serous in nature.  Lab Results:  Recent Labs    01/14/24 0355 01/15/24 0407  WBC 12.9* 12.4*  HGB 13.1 13.0  HCT 40.1 39.2  PLT 365 355   BMET Recent Labs    01/14/24 0355 01/15/24 0407  NA 136 138  K 4.6 4.4  CL 101 101  CO2 28 28  GLUCOSE 138* 145*  BUN 11 13  CREATININE 0.85 0.83  CALCIUM  8.6* 8.9   PT/INR No results for input(s): LABPROT, INR in the last 72 hours.  Studies/Results: CT GUIDED PERITONEAL/RETROPERITONEAL FLUID DRAIN BY PERC CATH Result Date: 01/13/2024 INDICATION: 63 year old with a right lower quadrant abscess and suggestive for a perforated appendicitis. Patient presents for image guided drainage of the right lower quadrant abdominal abscess. EXAM: CT-GUIDED DRAINAGE OF RIGHT LOWER QUADRANT ABDOMINAL ABSCESS TECHNIQUE: Multidetector CT imaging of the abdomen was performed following the standard protocol without IV contrast. RADIATION DOSE REDUCTION: This exam was performed according to the departmental dose-optimization program which includes automated exposure control, adjustment of the mA and/or kV according to patient size and/or use of iterative reconstruction technique. MEDICATIONS: Moderate sedation ANESTHESIA/SEDATION: Moderate (conscious) sedation was employed during this procedure. A total of Versed  2 mg and Fentanyl  100 mcg was  administered intravenously by the radiology nurse. Total intra-service moderate Sedation Time: 24 minutes. The patient's level of consciousness and vital signs were monitored continuously by radiology nursing throughout the procedure under my direct supervision. COMPLICATIONS: None immediate. PROCEDURE: Informed written consent was obtained from the patient after a thorough discussion of the procedural risks, benefits and alternatives. All questions were addressed. Maximal Sterile Barrier Technique was utilized including caps, mask, sterile gowns, sterile gloves, sterile drape, hand hygiene and skin antiseptic. A timeout was performed prior to the initiation of the procedure. Patient was placed on the CT scanner. CT images were obtained with the right side slightly elevated. Right lower quadrant was prepped with chlorhexidine  and sterile field was created. Skin was anesthetized using 1% lidocaine . Small incision was made. Using CT guidance, an 18 gauge trocar needle was directed into the right lower quadrant abscess and yellow purulent fluid was aspirated. Superstiff Amplatz wire was advanced into the collection. The tract was dilated to accommodate 10 [place] multipurpose drain. 25 mL of purulent fluid was removed. Drain was sutured to skin and attached to a suction bulb. Dressing was placed. FINDINGS: 25 mL of yellow purulent fluid was removed from the right lower quadrant abscess. IMPRESSION: CT-guided placement of a drainage catheter in the right lower quadrant abscess. Electronically Signed   By: Juliene Balder M.D.   On: 01/13/2024 21:31    Anti-infectives: Anti-infectives (From admission, onward)    Start     Dose/Rate Route Frequency Ordered Stop   01/15/24 1145  cefTRIAXone  (ROCEPHIN ) 2 g in sodium chloride  0.9 % 100 mL IVPB  2 g 200 mL/hr over 30 Minutes Intravenous Every 24 hours 01/15/24 1045     01/13/24 1300  ciprofloxacin  (CIPRO ) IVPB 400 mg  Status:  Discontinued        400 mg 200 mL/hr  over 60 Minutes Intravenous Every 12 hours 01/13/24 0238 01/15/24 1045   01/13/24 0400  metroNIDAZOLE  (FLAGYL ) IVPB 500 mg        500 mg 100 mL/hr over 60 Minutes Intravenous Every 12 hours 01/13/24 0238     01/13/24 0030  ciprofloxacin  (CIPRO ) IVPB 400 mg        400 mg 200 mL/hr over 60 Minutes Intravenous  Once 01/13/24 0027 01/13/24 0206   01/13/24 0030  metroNIDAZOLE  (FLAGYL ) IVPB 500 mg  Status:  Discontinued        500 mg 100 mL/hr over 60 Minutes Intravenous  Once 01/13/24 0027 01/13/24 0246       Assessment/Plan: Impression: Status post percutaneous drainage of appendiceal abscess.  Awaiting final ID of the bacteria.  May be discharged from the surgical standpoint on appropriate antibiotic.  Probably Augmentin will be the drug of choice.  I will see the patient again in 4 weeks in my office.  Patient should follow-up with interventional radiology drain clinic for follow-up management of her drain.  Discussed with Dr. Vann.  LOS: 2 days    Oneil Budge 01/15/2024

## 2024-01-15 NOTE — Progress Notes (Signed)
 PROGRESS NOTE  Rock [name]  DOB: 19-Mar-1961  PCP: Caitlyn Chew, MD FMW:[id number]  DOA: 01/12/2024  LOS: 2 days  Hospital Day: 4  Brief narrative: Caitlyn Anderson is a 63 y.o. female with PMH significant for DM2, HTN, HLD, obesity, hypothyroidism, GERD, tracheal stenosis on 7/6, patient presented to the ED with complaint of nausea, vomiting, constipation, abdominal pain, poor oral tolerance for 1 week.  Was seen by PCP 6 days ago, reportedly noted elevated liver enzymes and abnormal electrolytes. CT abdomen and pelvis showed Right lower quadrant abscess with adjacent stranding likely representing acute appendicitis with appendiceal abscess. -Mildly dilated fluid-filled small bowel with some small bowel wall thickening -reactive ileus versus enteritis. 7/7, underwent CT-guided drain placement by IR at [facility].  25 mL of purulent fluid was removed.  Fluid sent for culture.  Subjective: Eating well, and overall feeling better, knows we are waiting on cultures prior to discharge  Assessment and plan: Acute appendicitis with abscess S/p CT-guided drain placement - IR 7/7 Presented with 5 days of abdominal symptoms   Did not meet sepsis criteria CT abdomen as above showing appendiceal abscess General surgery recommended IR consultation 7/7, patient underwent CT-guided drain placement by IR at [facility].  25 mL of purulent fluid was removed.  Fluid sent by IR is growing abundant gram-negative rods and few gram-positive cocci, pending identification -IV Abx  Positive blood culture Blood culture is growing gram-positive cocci in aerobic bottle only, pending identification -- on IV abx  Type 2 diabetes mellitus hyperglycemia A1c 7.2 on 01/13/2024 PTA meds-metformin  100 mg twice daily, currently on hold Continue SSI/Accu-Cheks.  Essential hypertension PTA meds- valsartan  160 mg daily Currently on hold.  Changing to make something like little bit like an Ensure so  either  Hyperlipidemia PTA meds- aspirin  and Lipitor  Aspirin  on hold.   Reportedly had abnormal LFTs with PCP 5 days ago.  Repeat LFTs here are normal. Continue Lipitor .  Asthma No signs of acute exacerbation  Continue bronchodilator therapy    Hypothyroidism Continue with levothyroxine  75 mcg daily   Depression Continue with escitalopram  10 mg daily    Mobility: Independent at baseline.  Encourage ambulation  Goals of care   Code Status: Full Code     DVT prophylaxis:  enoxaparin  (LOVENOX ) injection 40 mg Start: 01/13/24 1000 SCDs Start: 01/13/24 0239  Consultants: General Surgery, IR Family Communication: None at bedside  Status: Inpatient Level of care:  Med-Surg   Patient is from: Home  Anticipated d/c to: Pending clinical course, ultimately home once cultures have resulted and patient can be on oral antibiotics       Scheduled Meds:  atorvastatin   20 mg Oral Daily   enoxaparin  (LOVENOX ) injection  40 mg Subcutaneous Q24H   erythromycin   1 Application Both Eyes QHS   escitalopram   10 mg Oral Daily   insulin  aspart  0-5 Units Subcutaneous QHS   insulin  aspart  0-9 Units Subcutaneous TID WC   latanoprost   1 drop Both Eyes QHS   levothyroxine   75 mcg Oral QAC breakfast   pantoprazole  (PROTONIX ) IV  40 mg Intravenous Q24H    PRN meds: acetaminophen  **OR** acetaminophen , albuterol , docusate sodium , HYDROcodone -acetaminophen , morphine  injection, ondansetron  **OR** ondansetron  (ZOFRAN ) IV   Infusions:   cefTRIAXone  (ROCEPHIN )  IV     metronidazole  500 mg (01/15/24 0514)      Objective: Vitals:   01/14/24 2058 01/15/24 0510  BP: 127/79 124/72  Pulse: 74 70  Resp: 20 15  Temp:  98.3 F (36.8 C) (!) 97.4 F (36.3 C)  SpO2: 95% 92%    Intake/Output Summary (Last 24 hours) at 01/15/2024 1105 Last data filed at 01/15/2024 0830 Gross per 24 hour  Intake 960 ml  Output 5 ml  Net 955 ml   Filed Weights   01/12/24 2156 01/13/24 0200  Weight: 87.5  kg 85.5 kg   Weight change:  Body mass index is 35.62 kg/m.   Physical Exam this morning:   General: Appearance:  Patient female in no acute distress     Lungs:     respirations unlabored  Heart:    Normal heart rate.    MS:   All extremities are intact.   Neurologic:   Awake, alert    Data Review: I have personally reviewed the laboratory data and studies available.  WBCs trending down  Signed, Caitlyn [name], DO [facility] 01/15/2024

## 2024-01-16 ENCOUNTER — Other Ambulatory Visit (HOSPITAL_COMMUNITY): Payer: Self-pay | Admitting: *Deleted

## 2024-01-16 ENCOUNTER — Inpatient Hospital Stay (HOSPITAL_COMMUNITY): Payer: Self-pay

## 2024-01-16 DIAGNOSIS — K3532 Acute appendicitis with perforation and localized peritonitis, without abscess: Secondary | ICD-10-CM | POA: Diagnosis not present

## 2024-01-16 DIAGNOSIS — R7881 Bacteremia: Secondary | ICD-10-CM

## 2024-01-16 LAB — GLUCOSE, CAPILLARY
Glucose-Capillary: 146 mg/dL — ABNORMAL HIGH (ref 70–99)
Glucose-Capillary: 168 mg/dL — ABNORMAL HIGH (ref 70–99)
Glucose-Capillary: 145 mg/dL — ABNORMAL HIGH (ref 70–99)
Glucose-Capillary: 177 mg/dL — ABNORMAL HIGH (ref 70–99)
Glucose-Capillary: 180 mg/dL — ABNORMAL HIGH (ref 70–99)

## 2024-01-16 LAB — BASIC METABOLIC PANEL WITH GFR
Anion gap: 10 (ref 5–15)
BUN: 13 mg/dL (ref 8–23)
CO2: 24 mmol/L (ref 22–32)
Calcium: 8.9 mg/dL (ref 8.9–10.3)
Chloride: 102 mmol/L (ref 98–111)
Creatinine, Ser: 0.74 mg/dL (ref 0.44–1.00)
GFR, Estimated: >60 mL/min (ref >60)
Glucose, Bld: 126 mg/dL — ABNORMAL HIGH (ref 70–99)
Potassium: 4.3 mmol/L (ref 3.5–5.1)
Sodium: 136 mmol/L (ref 135–145)

## 2024-01-16 LAB — CBC
HCT: 41.6 % (ref 36.0–46.0)
Hemoglobin: 13.2 g/dL (ref 12.0–15.0)
MCH: 30.8 pg (ref 26.0–34.0)
MCHC: 31.7 g/dL (ref 30.0–36.0)
MCV: 97 fL (ref 80.0–100.0)
Platelets: 361 K/uL (ref 150–400)
RBC: 4.29 MIL/uL (ref 3.87–5.11)
RDW: 12.9 % (ref 11.5–15.5)
WBC: 12.2 K/uL — ABNORMAL HIGH (ref 4.0–10.5)
nRBC: 0 % (ref 0.0–0.2)

## 2024-01-16 LAB — ECHOCARDIOGRAM COMPLETE
Area-P 1/2: 5.66 cm2
Height: 61 in
S' Lateral: 2.9 cm
Weight: 3015.89 [oz_av]

## 2024-01-16 LAB — CULTURE, BLOOD (ROUTINE X 2)
Culture  Setup Time: NO GROWTH
Special Requests: ADEQUATE

## 2024-01-16 MED ORDER — SODIUM CHLORIDE 0.9 % IV SOLN
2.0000 g | INTRAVENOUS | Status: DC
  Administered 2024-01-16 – 2024-01-17 (×2): 2 g via INTRAVENOUS
  Filled 2024-01-16: qty 20

## 2024-01-16 MED ORDER — METRONIDAZOLE 500 MG PO TABS
500.0000 mg | ORAL_TABLET | Freq: Two times a day (BID) | ORAL | Status: DC
  Administered 2024-01-16 – 2024-01-18 (×4): 500 mg via ORAL
  Filled 2024-01-16 (×4): qty 1

## 2024-01-16 NOTE — Progress Notes (Signed)
*  PRELIMINARY RESULTS* Echocardiogram 2D Echocardiogram has been performed.  Caitlyn Anderson 01/16/2024, 3:26 PM

## 2024-01-16 NOTE — Plan of Care (Signed)
  Problem: Education: Goal: Knowledge of General Education information will improve Description: Including pain rating scale, medication(s)/side effects and non-pharmacologic comfort measures Outcome: Adequate for Discharge   Problem: Health Behavior/Discharge Planning: Goal: Ability to manage health-related needs will improve Outcome: Progressing   Problem: Clinical Measurements: Goal: Ability to maintain clinical measurements within normal limits will improve Outcome: Progressing Goal: Will remain free from infection Outcome: Progressing Goal: Diagnostic test results will improve Outcome: Progressing Goal: Respiratory complications will improve Outcome: Adequate for Discharge Goal: Cardiovascular complication will be avoided Outcome: Adequate for Discharge   Problem: Activity: Goal: Risk for activity intolerance will decrease Outcome: Adequate for Discharge   Problem: Nutrition: Goal: Adequate nutrition will be maintained Outcome: Adequate for Discharge   Problem: Coping: Goal: Level of anxiety will decrease Outcome: Adequate for Discharge   Problem: Elimination: Goal: Will not experience complications related to bowel motility Outcome: Progressing Goal: Will not experience complications related to urinary retention Outcome: Adequate for Discharge   Problem: Pain Managment: Goal: General experience of comfort will improve and/or be controlled Outcome: Progressing   Problem: Safety: Goal: Ability to remain free from injury will improve Outcome: Progressing   Problem: Skin Integrity: Goal: Risk for impaired skin integrity will decrease Outcome: Progressing   Problem: Education: Goal: Ability to describe self-care measures that may prevent or decrease complications (Diabetes Survival Skills Education) will improve Outcome: Progressing Goal: Individualized Educational Video(s) Outcome: Progressing   Problem: Coping: Goal: Ability to adjust to condition or  change in health will improve Outcome: Adequate for Discharge   Problem: Fluid Volume: Goal: Ability to maintain a balanced intake and output will improve Outcome: Adequate for Discharge   Problem: Health Behavior/Discharge Planning: Goal: Ability to identify and utilize available resources and services will improve Outcome: Adequate for Discharge Goal: Ability to manage health-related needs will improve Outcome: Adequate for Discharge   Problem: Metabolic: Goal: Ability to maintain appropriate glucose levels will improve Outcome: Progressing   Problem: Nutritional: Goal: Maintenance of adequate nutrition will improve Outcome: Adequate for Discharge Goal: Progress toward achieving an optimal weight will improve Outcome: Progressing   Problem: Skin Integrity: Goal: Risk for impaired skin integrity will decrease Outcome: Progressing   Problem: Tissue Perfusion: Goal: Adequacy of tissue perfusion will improve Outcome: Adequate for Discharge

## 2024-01-16 NOTE — Plan of Care (Signed)
   Problem: Education: Goal: Knowledge of General Education information will improve Description: Including pain rating scale, medication(s)/side effects and non-pharmacologic comfort measures Outcome: Progressing   Problem: Clinical Measurements: Goal: Ability to maintain clinical measurements within normal limits will improve Outcome: Progressing Goal: Diagnostic test results will improve Outcome: Progressing

## 2024-01-16 NOTE — Progress Notes (Signed)
 PROGRESS NOTE  Rock [name]  DOB: [birth year]  PCP: Luetta Chew, MD FMW:[id number]  DOA: 01/12/2024  LOS: 3 days  Hospital Day: 5  Brief narrative: Caitlyn Anderson is a 63 y.o. female with PMH significant for DM2, HTN, HLD, obesity, hypothyroidism, GERD, tracheal stenosis on 7/6, patient presented to the ED with complaint of nausea, vomiting, constipation, abdominal pain, poor oral tolerance for 1 week.  Was seen by PCP 6 days ago, reportedly noted elevated liver enzymes and abnormal electrolytes. CT abdomen and pelvis showed Right lower quadrant abscess with adjacent stranding likely representing acute appendicitis with appendiceal abscess. -Mildly dilated fluid-filled small bowel with some small bowel wall thickening -reactive ileus versus enteritis. 7/7, underwent CT-guided drain placement by IR at [facility].  25 mL of purulent fluid was removed.  Fluid sent for culture.  Subjective: Eating well, and overall feeling better, knows we are waiting on cultures prior to discharge  Assessment and plan: Acute appendicitis with abscess S/p CT-guided drain placement - IR 7/7 Presented with 5 days of abdominal symptoms   Did not meet sepsis criteria CT abdomen as above showing appendiceal abscess General surgery recommended IR consultation 7/7, patient underwent CT-guided drain placement by IR at [facility].  25 mL of purulent fluid was removed.  Fluid sent by IR is growing abundant ecoli -IV Abx  Positive blood culture Blood culture: strep mitis-- discussed with ID-- would repeat blood cultures and get echo -on IV abx  Type 2 diabetes mellitus hyperglycemia A1c 7.2 on 01/13/2024 PTA meds-metformin  100 mg twice daily, currently on hold Continue SSI/Accu-Cheks.  Essential hypertension PTA meds- valsartan  160 mg daily Currently on hold.  Changing to make something like little bit like an Ensure so either  Hyperlipidemia PTA meds- aspirin  and Lipitor  Aspirin  on hold.   Reportedly  had abnormal LFTs with PCP 5 days ago.  Repeat LFTs here are normal. Continue Lipitor .  Asthma No signs of acute exacerbation  Continue bronchodilator therapy    Hypothyroidism Continue with levothyroxine  75 mcg daily   Depression Continue with escitalopram  10 mg daily    Mobility: Independent at baseline.  Encourage ambulation  Goals of care   Code Status: Full Code     DVT prophylaxis:  enoxaparin  (LOVENOX ) injection 40 mg Start: 01/13/24 1000 SCDs Start: 01/13/24 0239  Consultants: General Surgery, IR, ID Family Communication: None at bedside  Status: Inpatient Level of care:  Med-Surg   Patient is from: Home  Anticipated d/c to: Pending clinical course, ultimately home once cultures have resulted and patient can be on oral antibiotics       Scheduled Meds:  atorvastatin   20 mg Oral Daily   brimonidine   1 drop Both Eyes BID   enoxaparin  (LOVENOX ) injection  40 mg Subcutaneous Q24H   erythromycin   1 Application Both Eyes QHS   escitalopram   10 mg Oral Daily   insulin  aspart  0-5 Units Subcutaneous QHS   insulin  aspart  0-9 Units Subcutaneous TID WC   latanoprost   1 drop Both Eyes QHS   levothyroxine   75 mcg Oral QAC breakfast   pantoprazole  (PROTONIX ) IV  40 mg Intravenous Q24H    PRN meds: acetaminophen  **OR** acetaminophen , albuterol , docusate sodium , HYDROcodone -acetaminophen , morphine  injection, ondansetron  **OR** ondansetron  (ZOFRAN ) IV   Infusions:   cefTRIAXone  (ROCEPHIN )  IV        Objective: Vitals:   01/15/24 1953 01/16/24 0543  BP: 137/88 133/89  Pulse: 70 71  Resp: 18 18  Temp: 98.3 F (36.8  C) 97.6 F (36.4 C)  SpO2: 98% 95%    Intake/Output Summary (Last 24 hours) at 01/16/2024 1150 Last data filed at 01/16/2024 0338 Gross per 24 hour  Intake 740 ml  Output --  Net 740 ml   Filed Weights   01/12/24 2156 01/13/24 0200  Weight: 87.5 kg 85.5 kg   Weight change:  Body mass index is 35.62 kg/m.   Physical Exam this  morning:  General: Appearance:    Obese female in no acute distress     Lungs:     respirations unlabored  Heart:    Normal heart rate.   MS:   All extremities are intact.   Neurologic:   Awake, alert      Data Review: I have personally reviewed the laboratory data and studies available.  WBCs trending down  Signed, Harlene [name], DO [facility] 01/16/2024

## 2024-01-17 ENCOUNTER — Other Ambulatory Visit (HOSPITAL_COMMUNITY): Payer: Self-pay

## 2024-01-17 ENCOUNTER — Other Ambulatory Visit: Payer: Self-pay | Admitting: Student

## 2024-01-17 DIAGNOSIS — K3532 Acute appendicitis with perforation and localized peritonitis, without abscess: Secondary | ICD-10-CM | POA: Diagnosis not present

## 2024-01-17 LAB — GLUCOSE, CAPILLARY
Glucose-Capillary: 128 mg/dL — ABNORMAL HIGH (ref 70–99)
Glucose-Capillary: 145 mg/dL — ABNORMAL HIGH (ref 70–99)
Glucose-Capillary: 154 mg/dL — ABNORMAL HIGH (ref 70–99)
Glucose-Capillary: 150 mg/dL — ABNORMAL HIGH (ref 70–99)

## 2024-01-17 LAB — AEROBIC/ANAEROBIC CULTURE W GRAM STAIN (SURGICAL/DEEP WOUND)

## 2024-01-17 MED ORDER — SODIUM CHLORIDE FLUSH 0.9 % IV SOLN
5.0000 mL | Freq: Every day | INTRAVENOUS | 0 refills | Status: AC
  Filled 2024-01-17: qty 100, 20d supply, fill #0

## 2024-01-17 MED ORDER — SODIUM CHLORIDE 0.9% FLUSH
5.0000 mL | Freq: Three times a day (TID) | INTRAVENOUS | Status: DC
  Administered 2024-01-17 – 2024-01-18 (×2): 5 mL

## 2024-01-17 NOTE — Progress Notes (Signed)
 PROGRESS NOTE  [name]  DOB: [birth year]  PCP: Caitlyn Chew, MD FMW:[id number]  DOA: 01/12/2024  LOS: 4 days  Hospital Day: 6  Brief narrative: Caitlyn Anderson is a 63 y.o. female with PMH significant for DM2, HTN, HLD, obesity, hypothyroidism, GERD, tracheal stenosis on 7/6, patient presented to the ED with complaint of nausea, vomiting, constipation, abdominal pain, poor oral tolerance for 1 week.  Was seen by PCP 6 days ago, reportedly noted elevated liver enzymes and abnormal electrolytes. CT abdomen and pelvis showed Right lower quadrant abscess with adjacent stranding likely representing acute appendicitis with appendiceal abscess. -Mildly dilated fluid-filled small bowel with some small bowel wall thickening -reactive ileus versus enteritis. 7/7, underwent CT-guided drain placement by IR at [facility].  25 mL of purulent fluid was removed.  Fluid sent for culture.  Subjective: No complaints, feels well  Assessment and plan: Acute appendicitis with abscess S/p CT-guided drain placement - IR 7/7 Presented with 5 days of abdominal symptoms   Did not meet sepsis criteria CT abdomen as above showing appendiceal abscess General surgery recommended IR consultation 7/7, patient underwent CT-guided drain placement by IR at [facility].  25 mL of purulent fluid was removed.  Fluid sent by IR is growing abundant ecoli and a couple other organisms -IV Abx plus oral Flagyl   Positive blood culture Blood culture: strep mitis-- discussed with ID-- would repeat blood cultures and get echo-repeat blood cultures no growth to date and echo without any endocarditis -on IV abx  Type 2 diabetes mellitus hyperglycemia A1c 7.2 on 01/13/2024 PTA meds-metformin  100 mg twice daily, currently on hold Continue SSI/Accu-Cheks.  Essential hypertension PTA meds- valsartan  160 mg daily Currently on hold.  Changing to make something like little bit like an Ensure so either  Hyperlipidemia PTA meds-  aspirin  and Lipitor  Aspirin  on hold.   Reportedly had abnormal LFTs with PCP 5 days ago.  Repeat LFTs here are normal. Continue Lipitor .  Asthma No signs of acute exacerbation  Continue bronchodilator therapy    Hypothyroidism Continue with levothyroxine  75 mcg daily   Depression Continue with escitalopram  10 mg daily    Mobility: Independent at baseline.  Encourage ambulation  Goals of care   Code Status: Full Code     DVT prophylaxis:  enoxaparin  (LOVENOX ) injection 40 mg Start: 01/13/24 1000 SCDs Start: 01/13/24 0239  Consultants: General Surgery, IR, ID Family Communication: None at bedside  Status: Inpatient Level of care:  Med-Surg   Patient is from: Home  Anticipated d/c to: Pending clinical course, ultimately home once cultures have resulted and patient can be on oral antibiotics tomorrow if cultures remain negative       Scheduled Meds:  atorvastatin   20 mg Oral Daily   brimonidine   1 drop Both Eyes BID   enoxaparin  (LOVENOX ) injection  40 mg Subcutaneous Q24H   erythromycin   1 Application Both Eyes QHS   escitalopram   10 mg Oral Daily   insulin  aspart  0-5 Units Subcutaneous QHS   insulin  aspart  0-9 Units Subcutaneous TID WC   latanoprost   1 drop Both Eyes QHS   levothyroxine   75 mcg Oral QAC breakfast   metroNIDAZOLE   500 mg Oral Q12H   pantoprazole  (PROTONIX ) IV  40 mg Intravenous Q24H    PRN meds: acetaminophen  **OR** acetaminophen , albuterol , docusate sodium , HYDROcodone -acetaminophen , morphine  injection, ondansetron  **OR** ondansetron  (ZOFRAN ) IV   Infusions:   cefTRIAXone  (ROCEPHIN )  IV 2 g (01/16/24 1217)      Objective: Vitals:  01/17/24 0800 01/17/24 0924  BP: (!) 155/90 134/74  Pulse: 63   Resp: 18   Temp: 97.7 F (36.5 C)   SpO2: 97%     Intake/Output Summary (Last 24 hours) at 01/17/2024 0948 Last data filed at 01/17/2024 0913 Gross per 24 hour  Intake 340 ml  Output --  Net 340 ml   Filed Weights   01/12/24  2156 01/13/24 0200  Weight: 87.5 kg 85.5 kg   Weight change:  Body mass index is 35.62 kg/m.   Physical Exam this morning:  General: Appearance:    Obese female in no acute distress-sitting up in chair, shoes on     Lungs:     respirations unlabored  Heart:    Normal heart rate.   MS:   All extremities are intact.   Neurologic:   Awake, alert      Data Review: I have personally reviewed the laboratory data and studies available.    Signed, Harlene [name], DO [facility] 01/17/2024

## 2024-01-17 NOTE — Progress Notes (Signed)
 IR notified that patient is anticipating d/c soon.  Outpatient f/u has beet set up, prescription for pre filled saline flush sent to [facility] pharmacy.   IR Drain Care Instruction     Always wash your hands before manipulating drain Flush the drain with 5 cc normal saline daily - Please pick up saline flushes at [facility] outpatient pharmacy  Record output daily (subtract 5 cc that was used to flush the drain from the total daily output)  Dressing change as needed and at least once a week Detailed instruction with pictures can be found in your discharge summary.  You will receive a call from [facility]. If you have any questions about your follow up appointment, please call [phone] If you have concerns about your drain, please call [phone]   Toya VEAR Cousin PA-C 01/17/2024 11:28 AM

## 2024-01-17 NOTE — Progress Notes (Signed)
 Patient awaiting repeat blood culture results.  Has had minimal serous drainage from percutaneous drain.  Will get follow-up CT scan of the abdomen and pelvis tomorrow to assess the appendiceal abscess.  Should it be resolved, will pull the drain.  Discussed with Dr. Juvenal and patient.

## 2024-01-17 NOTE — Discharge Instructions (Signed)
 Interventional Radiology Percutaneous Abscess Drain Placement After Care  This sheet gives you information about how to care for yourself after your procedure. Your health care provider may also give you more specific instructions. Your drain was placed by an interventional radiologist with [facility]. If you have questions or concerns, contact [facility] at [phone]. What is a percutaneous drain? A drain is a small plastic tube (catheter) that goes into the fluid collection in your body through your skin. How long will I need the drain? How long the drain needs to stay in is determined by where the drain is, how much comes out of the drain each day and if you are having any other surgical procedures. Interventional radiology will determine when it is time to remove the drain. It is important to follow up as directed so that the drain can be removed as soon as it is safe to do so. What can I expect after the procedure? After the procedure, it is common to have: A small amount of bruising and discomfort in the area where the drainage tube (catheter) was placed. Sleepiness and fatigue. This should go away after the medicines you were given have worn off. Follow these instructions at home: Insertion site care Check your insertion site when you change the bandage. Check for: More redness, swelling, or pain. More fluid or blood. Warmth. Pus or a bad smell. When caring for your insertion site: Wash your hands with soap and water for at least 20 seconds before and after you change your bandage (dressing). If soap and water are not available, use hand sanitizer. You do not need to change your dressing everyday if it is clean and dry. Change your dressing every 3 days or as needed when it is soiled, wet or becoming dislodged. You will need to change your dressing each time you shower. Leave stitches (sutures), skin glue, or adhesive strips in place. These skin closures may need  to stay in place for 2 weeks or longer. If adhesive strip edges start to loosen and curl up, you may trim the loose edges. Do not remove adhesive strips completely unless your health care provider tells you to do so.  Catheter care Flush the catheter once per day with 5 mL of 0.9% normal saline unless you are told otherwise by your healthcare provider. This helps to prevent clogs in the catheter. To disconnect the drain, turn the clear plastic tube to the left. Attach the saline syringe by placing it on the white end of the drain and turning gently to the right. Once attached gently push the plunger to the 5 mL mark. After you are done flushing, disconnect the syringe by turning to the left and reattach your drainage container    If you have a bulb please be sure the bulb is charged after reconnecting it - to do this pinch the bulb between your thumb and first finger and close the stopper located on the top of the bulb.   Check for fluid leaking from around your catheter (instead of fluid draining through your catheter). This may be a sign that the drain is no longer working correctly. Write down the following information every time you empty your bag: The date and time. The amount of drainage. Activity Rest at home for 1-2 days after your procedure. For the first 48 hours do not lift anything more than 10 lbs (about a gallon of milk). You may perform moderate activities/exercise. Please avoid strenuous activities during this  time. Avoid any activities which may pull on your drain as this can cause your drain to become dislodged. If you were given a sedative during the procedure, it can affect you for several hours. Do not drive or operate machinery until your health care provider says that it is safe. General instructions For mild pain take over-the-counter medications as needed for pain such as Tylenol or Advil. If you are experiencing severe pain please call our office as this may indicate an  issue with your drain.  If you were prescribed an antibiotic medicine, take it as told by your health care provider. Do not stop using the antibiotic even if you start to feel better. You may shower 24 hours after the drain is placed. To do this cover the insertion site with a water tight material such as saran wrap and seal the edges with tape, you may also purchase waterproof dressings at your local drug store. Shower as usual and then remove the water tight dressing and any gauze/tape underneath it once you have exited the shower and dried off. Allow the area to air dry or pat dry with a clean towel. Once the skin is completely dry place a new gauze dressing. It is important to keep the site dry at all times to prevent infection. Do not submerge the drain - this means you cannot take baths, swim, use a hot tub, etc. until the drain is removed.  Do not use any products that contain nicotine or tobacco, such as cigarettes, e-cigarettes, and chewing tobacco. If you need help quitting, ask your health care provider. Keep all follow-up visits as told by your health care provider. This is important. Contact a health care provider if: You have less than 10 mL of drainage a day for 2-3 days in a row, or as directed by your health care provider. You have any of these signs of infection: More redness, swelling, or pain around your incision area. More fluid or blood coming from your incision area. Warmth coming from your incision area. Pus or a bad smell coming from your incision area. You have fluid leaking from around your catheter (instead of through your catheter). You are unable to flush the drain. You have a fever or chills. You have pain that does not get better with medicine. You have not been contacted to schedule a drain follow up appointment within 10 days of discharge from the hospital. Please call [facility] at [phone] with any questions or concerns. Get help right away  if: Your catheter comes out. You suddenly stop having drainage from your catheter. You suddenly have blood in the fluid that is draining from your catheter. You become dizzy or you faint. You develop a rash. You have nausea or vomiting. You have difficulty breathing or you feel short of breath. You develop chest pain. You have problems with your speech or vision. You have trouble balancing or moving your arms or legs. Summary It is common to have a small amount of bruising and discomfort in the area where the drainage tube (catheter) was placed. You may also have minor discomfort with movement while the drain is in place. Flush the drain once per day with 5 mL of 0.9% normal saline (unless you were told otherwise by your healthcare provider).  Record the amount of drainage from the bag every time you empty it. Change the dressing every 3 days or earlier if soiled/wet. Keep the skin dry under the dressing. You may shower with  the drain in place. Do not submerge the drain (no baths, swimming, hot tubs, etc.). Contact [facility] at [phone] if you have more redness, swelling, or pain around your incision area or if you have pain that does not get better with medicine. This information is not intended to replace advice given to you by your health care provider. Make sure you discuss any questions you have with your health care provider. Document Revised: 09/28/2021 Document Reviewed: 06/20/2019 [employer] Patient Education  2023 [employer]    Interventional Radiology Drain Record Empty your drain at least once per day. You may empty it as often as needed. Use this form to write down the amount of fluid that has collected in the drainage container. Bring this form with you to your follow-up visits. Please call [facility] at [phone] with any questions or concerns prior to your appointment.  Drain #1 location: ___________________ Date __________ Time __________  Amount __________ Date __________ Time __________ Amount __________ Date __________ Time __________ Amount __________ Date __________ Time __________ Amount __________ Date __________ Time __________ Amount __________ Date __________ Time __________ Amount __________ Date __________ Time __________ Amount __________ Date __________ Time __________ Amount __________ Date __________ Time __________ Amount __________ Date __________ Time __________ Amount __________ Date __________ Time __________ Amount __________ Date __________ Time __________ Amount __________ Date __________ Time __________ Amount __________ Date __________ Time __________ Amount __________     Horace Porteous #2 location: ___________________ Date __________ Time __________ Amount __________ Date __________ Time __________ Amount __________ Date __________ Time __________ Amount __________ Date __________ Time __________ Amount __________ Date __________ Time __________ Amount __________ Date __________ Time __________ Amount __________ Date __________ Time __________ Amount __________ Date __________ Time __________ Amount __________ Date __________ Time __________ Amount __________ Date __________ Time __________ Amount __________ Date __________ Time __________ Amount __________ Date __________ Time __________ Amount __________ Date __________ Time __________ Amount __________ Date __________ Time __________ Amount __________

## 2024-01-17 NOTE — Plan of Care (Signed)
  Problem: Education: Goal: Knowledge of General Education information will improve Description: Including pain rating scale, medication(s)/side effects and non-pharmacologic comfort measures Outcome: Adequate for Discharge   Problem: Clinical Measurements: Goal: Ability to maintain clinical measurements within normal limits will improve Outcome: Adequate for Discharge Goal: Diagnostic test results will improve Outcome: Adequate for Discharge

## 2024-01-17 NOTE — Plan of Care (Signed)
  Problem: Education: Goal: Knowledge of General Education information will improve Description: Including pain rating scale, medication(s)/side effects and non-pharmacologic comfort measures Outcome: Progressing   Problem: Activity: Goal: Risk for activity intolerance will decrease Outcome: Progressing   Problem: Nutrition: Goal: Adequate nutrition will be maintained Outcome: Progressing   Problem: Coping: Goal: Level of anxiety will decrease Outcome: Progressing   Problem: Elimination: Goal: Will not experience complications related to urinary retention Outcome: Progressing   

## 2024-01-17 NOTE — Progress Notes (Signed)
 Mobility Specialist Progress Note:    01/17/24 1050  Mobility  Activity Ambulated with assistance in hallway  Level of Assistance Independent  Assistive Device None  Distance Ambulated (ft) 400 ft  Range of Motion/Exercises Active;All extremities  Activity Response Tolerated well  Mobility Referral Yes  Mobility visit 1 Mobility  Mobility Specialist Start Time (ACUTE ONLY) 1050  Mobility Specialist Stop Time (ACUTE ONLY) 1108  Mobility Specialist Time Calculation (min) (ACUTE ONLY) 18 min   Pt received in bed, agreeable to mobility. Independently able to stand and ambulate with no AD. Tolerated well,asx throughout. All needs met.  Malania Gawthrop Mobility Specialist Please contact via Special educational needs teacher or  Rehab office at [phone]

## 2024-01-18 ENCOUNTER — Inpatient Hospital Stay (HOSPITAL_COMMUNITY): Payer: Self-pay

## 2024-01-18 DIAGNOSIS — K3532 Acute appendicitis with perforation and localized peritonitis, without abscess: Secondary | ICD-10-CM | POA: Diagnosis not present

## 2024-01-18 LAB — CULTURE, BLOOD (ROUTINE X 2)
Culture: NO GROWTH
Special Requests: ADEQUATE

## 2024-01-18 LAB — GLUCOSE, CAPILLARY
Glucose-Capillary: 146 mg/dL — ABNORMAL HIGH (ref 70–99)
Glucose-Capillary: 225 mg/dL — ABNORMAL HIGH (ref 70–99)

## 2024-01-18 MED ORDER — IOHEXOL 9 MG/ML PO SOLN
ORAL | Status: AC
  Filled 2024-01-18: qty 1000

## 2024-01-18 MED ORDER — IOHEXOL 300 MG/ML  SOLN
100.0000 mL | Freq: Once | INTRAMUSCULAR | Status: AC | PRN
  Administered 2024-01-18: 100 mL via INTRAVENOUS

## 2024-01-18 MED ORDER — IOHEXOL 9 MG/ML PO SOLN
500.0000 mL | ORAL | Status: AC
  Administered 2024-01-18 (×2): 500 mL via ORAL

## 2024-01-18 MED ORDER — CEFADROXIL 500 MG PO CAPS: 1000.0000 mg | ORAL_CAPSULE | Freq: Two times a day (BID) | ORAL | 0 refills | Status: AC

## 2024-01-18 MED ORDER — CEFADROXIL 500 MG PO CAPS
1000.0000 mg | ORAL_CAPSULE | Freq: Two times a day (BID) | ORAL | Status: DC
  Filled 2024-01-18 (×3): qty 2

## 2024-01-18 MED ORDER — METRONIDAZOLE 500 MG PO TABS: 500.0000 mg | ORAL_TABLET | Freq: Two times a day (BID) | ORAL | 0 refills | Status: DC

## 2024-01-18 NOTE — Progress Notes (Signed)
 Follow-up CT scan of abdomen reveals a resolved appendiceal abscess.  Drain removed.  Okay for discharge from surgery standpoint.  I will see the patient in follow-up in 3 weeks.

## 2024-01-18 NOTE — Plan of Care (Signed)

## 2024-01-18 NOTE — Discharge Summary (Signed)
 Physician Discharge Summary  Caitlyn Anderson FMW:[id number] DOB: 04-15-1961 DOA: 01/12/2024  PCP: Luetta Chew, MD  Admit date: 01/12/2024 Discharge date: 01/18/2024  Admitted From: Home Discharge disposition: Home   Recommendations for Outpatient Follow-Up:   Drain removed 7/12 by general surgery 2 weeks total of antibiotics   Discharge Diagnosis:   Principal Problem:   Acute perforated appendicitis Active Problems:   Essential hypertension   Type 2 diabetes mellitus with hyperlipidemia (HCC)   Obesity, class 2   Asthma   Hypothyroidism   Depression    Discharge Condition: Improved.  Diet recommendation: Low sodium, heart healthy.  Carbohydrate-modified.  Regular.  Wound care: None.  Code status: Full.   History of Present Illness:   Caitlyn Anderson is a 63 y.o. female with PMH significant for DM2, HTN, HLD, obesity, hypothyroidism, GERD, tracheal stenosis on 7/6, patient presented to the ED with complaint of nausea, vomiting, constipation, abdominal pain, poor oral tolerance for 1 week.  Was seen by PCP 6 days ago, reportedly noted elevated liver enzymes and abnormal electrolytes. CT abdomen and pelvis showed Right lower quadrant abscess with adjacent stranding likely representing acute appendicitis with appendiceal abscess. -Mildly dilated fluid-filled small bowel with some small bowel wall thickening -reactive ileus versus enteritis. 7/7, underwent CT-guided drain placement by IR at [facility].  25 mL of purulent fluid was removed.  Fluid sent for culture.   Hospital Course by Problem:   Acute appendicitis with abscess S/p CT-guided drain placement - IR 7/7 Presented with 5 days of abdominal symptoms   Did not meet sepsis criteria CT abdomen as above showing appendiceal abscess General surgery recommended IR consultation 7/7, patient underwent CT-guided drain placement by IR at [facility].  25 mL of purulent fluid was removed.  Fluid sent by IR is  growing abundant ecoli and a couple other organisms -IV Abx plus oral Flagyl    Positive blood culture Blood culture: strep mitis-- discussed with ID-- would repeat blood cultures and get echo-repeat blood cultures no growth to date and echo without any endocarditis   Type 2 diabetes mellitus hyperglycemia A1c 7.2 on 01/13/2024 -Resume home meds   Essential hypertension -Resume home meds   Hyperlipidemia PTA meds- aspirin  and Lipitor  Aspirin  on hold.   Reportedly had abnormal LFTs with PCP 5 days ago.  Repeat LFTs here are normal. Continue Lipitor .   Asthma No signs of acute exacerbation  Continue bronchodilator therapy    Hypothyroidism Continue with levothyroxine  75 mcg daily   Depression Continue with escitalopram  10 mg daily    Medical Consultants:   General Surgery IR   Discharge Exam:   Vitals:   01/17/24 2003 01/18/24 0349  BP: 129/89 (!) 145/83  Pulse: 80 73  Resp: 16 14  Temp: 98.4 F (36.9 C) 98.2 F (36.8 C)  SpO2: 96% 98%   Vitals:   01/17/24 0924 01/17/24 1547 01/17/24 2003 01/18/24 0349  BP: 134/74 128/77 129/89 (!) 145/83  Pulse:  70 80 73  Resp:  18 16 14   Temp:  98.4 F (36.9 C) 98.4 F (36.9 C) 98.2 F (36.8 C)  TempSrc:  Oral Oral Oral  SpO2:  97% 96% 98%  Weight:      Height:        General exam: Appears calm and comfortable.   The results of significant diagnostics from this hospitalization (including imaging, microbiology, ancillary and laboratory) are listed below for reference.     Procedures and Diagnostic Studies:  CT GUIDED PERITONEAL/RETROPERITONEAL FLUID DRAIN BY PERC CATH Result Date: 01/13/2024 INDICATION: 63 year old with a right lower quadrant abscess and suggestive for a perforated appendicitis. Patient presents for image guided drainage of the right lower quadrant abdominal abscess. EXAM: CT-GUIDED DRAINAGE OF RIGHT LOWER QUADRANT ABDOMINAL ABSCESS TECHNIQUE: Multidetector CT imaging of the abdomen was performed  following the standard protocol without IV contrast. RADIATION DOSE REDUCTION: This exam was performed according to the departmental dose-optimization program which includes automated exposure control, adjustment of the mA and/or kV according to patient size and/or use of iterative reconstruction technique. MEDICATIONS: Moderate sedation ANESTHESIA/SEDATION: Moderate (conscious) sedation was employed during this procedure. A total of Versed  2 mg and Fentanyl  100 mcg was administered intravenously by the radiology nurse. Total intra-service moderate Sedation Time: 24 minutes. The patient's level of consciousness and vital signs were monitored continuously by radiology nursing throughout the procedure under my direct supervision. COMPLICATIONS: None immediate. PROCEDURE: Informed written consent was obtained from the patient after a thorough discussion of the procedural risks, benefits and alternatives. All questions were addressed. Maximal Sterile Barrier Technique was utilized including caps, mask, sterile gowns, sterile gloves, sterile drape, hand hygiene and skin antiseptic. A timeout was performed prior to the initiation of the procedure. Patient was placed on the CT scanner. CT images were obtained with the right side slightly elevated. Right lower quadrant was prepped with chlorhexidine  and sterile field was created. Skin was anesthetized using 1% lidocaine . Small incision was made. Using CT guidance, an 18 gauge trocar needle was directed into the right lower quadrant abscess and yellow purulent fluid was aspirated. Superstiff Amplatz wire was advanced into the collection. The tract was dilated to accommodate 10 [place] multipurpose drain. 25 mL of purulent fluid was removed. Drain was sutured to skin and attached to a suction bulb. Dressing was placed. FINDINGS: 25 mL of yellow purulent fluid was removed from the right lower quadrant abscess. IMPRESSION: CT-guided placement of a drainage catheter in the right  lower quadrant abscess. Electronically Signed   By: Juliene Balder M.D.   On: 01/13/2024 21:31   CT ABDOMEN PELVIS W CONTRAST Result Date: 01/13/2024 CLINICAL DATA:  Right lower quadrant abdominal pain. Nausea, vomiting, and constipation for 1 week. Abnormal electrolytes with elevated liver enzymes. EXAM: CT ABDOMEN AND PELVIS WITH CONTRAST TECHNIQUE: Multidetector CT imaging of the abdomen and pelvis was performed using the standard protocol following bolus administration of intravenous contrast. RADIATION DOSE REDUCTION: This exam was performed according to the departmental dose-optimization program which includes automated exposure control, adjustment of the mA and/or kV according to patient size and/or use of iterative reconstruction technique. CONTRAST:  OMNIPAQUE  IOHEXOL  300 MG/ML  SOLN COMPARISON:  CT chest 06/15/2020 FINDINGS: Lower chest: Mild dependent atelectasis in the lung bases. Hepatobiliary: Mild diffuse fatty infiltration of the liver. No focal lesions. Gallbladder and bile ducts are normal. Pancreas: Unremarkable. No pancreatic ductal dilatation or surrounding inflammatory changes. Spleen: Normal in size without focal abnormality. Adrenals/Urinary Tract: Adrenal glands are unremarkable. Kidneys are normal, without renal calculi, focal lesion, or hydronephrosis. Bladder is unremarkable. Stomach/Bowel: Stomach is not abnormally distended no discrete wall thickening is identified. Fluid-filled mildly dilated small bowel loops with decompressed terminal ileum. This could be reactive or may indicate enteritis. There is a loculated collection in the right lower quadrant measuring 5.1 x 3.6 cm and containing surrounding stranding consistent with an abscess. This is posterior and medial to the cecum. The appendix is only partially visualized but appears to be leading into this  collection consistent with acute appendicitis with appendiceal abscess. Adjacent small bowel wall thickening is likely  reactive. Stool-filled colon without distention. Vascular/Lymphatic: No significant vascular findings are present. No enlarged abdominal or pelvic lymph nodes. Reproductive: Uterus and bilateral adnexa are unremarkable. Other: Small amount of free fluid in the abdomen and pelvis, likely reactive. No free air. Abdominal wall musculature appears intact. Musculoskeletal: Postoperative left hip arthroplasty. No acute bony abnormalities. IMPRESSION: 1. Right lower quadrant abscess with adjacent stranding likely representing acute appendicitis with appendiceal abscess. 2. Mildly dilated fluid-filled small bowel with some small bowel wall thickening is likely reactive ileus but could also indicate enteritis. 3. Small amount of free fluid in the pelvis is likely reactive. 4. Mild fatty infiltration of the liver. Electronically Signed   By: Elsie Gravely M.D.   On: 01/13/2024 00:18     Labs:   Basic Metabolic Panel: Recent Labs  Lab 01/12/24 2250 01/13/24 0439 01/14/24 0355 01/15/24 0407 01/16/24 0421  NA 136 137 136 138 136  K 4.5 4.4 4.6 4.4 4.3  CL 95* 99 101 101 102  CO2 26 27 28 28 24   GLUCOSE 204* 184* 138* 145* 126*  BUN 12 12 11 13 13   CREATININE 0.79 0.74  0.79 0.85 0.83 0.74  CALCIUM  10.3 9.7 8.6* 8.9 8.9   GFR Estimated Creatinine Clearance: 70.5 mL/min (by C-G formula based on SCr of 0.74 mg/dL). Liver Function Tests: Recent Labs  Lab 01/12/24 2250  AST 22  ALT 44  ALKPHOS 101  BILITOT 0.9  PROT 7.8  ALBUMIN  3.3*   Recent Labs  Lab 01/12/24 2250  LIPASE 32   No results for input(s): AMMONIA in the last 168 hours. Coagulation profile No results for input(s): INR, PROTIME in the last 168 hours.  CBC: Recent Labs  Lab 01/12/24 2250 01/13/24 0439 01/14/24 0355 01/15/24 0407 01/16/24 0421  WBC 15.7* 14.5* 12.9* 12.4* 12.2*  NEUTROABS 12.4*  --  8.4* 7.2  --   HGB 15.4* 14.4 13.1 13.0 13.2  HCT 45.4 44.2 40.1 39.2 41.6  MCV 92.8 94.2 96.2 95.6 97.0   PLT 407* 372 365 355 361   Cardiac Enzymes: No results for input(s): CKTOTAL, CKMB, CKMBINDEX, TROPONINI in the last 168 hours. BNP: Invalid input(s): POCBNP CBG: Recent Labs  Lab 01/17/24 0745 01/17/24 1148 01/17/24 1614 01/17/24 2033 01/18/24 0707  GLUCAP 128* 145* 154* 150* 146*   D-Dimer No results for input(s): DDIMER in the last 72 hours. Hgb A1c No results for input(s): HGBA1C in the last 72 hours. Lipid Profile No results for input(s): CHOL, HDL, LDLCALC, TRIG, CHOLHDL, LDLDIRECT in the last 72 hours. Thyroid function studies No results for input(s): TSH, T4TOTAL, T3FREE, THYROIDAB in the last 72 hours.  Invalid input(s): FREET3 Anemia work up No results for input(s): VITAMINB12, FOLATE, FERRITIN, TIBC, IRON, RETICCTPCT in the last 72 hours. Microbiology Recent Results (from the past 240 hours)  Blood culture (routine x 2)     Status: Abnormal   Collection Time: 01/13/24 12:57 AM   Specimen: BLOOD RIGHT HAND  Result Value Ref Range Status   Specimen Description   Final    BLOOD RIGHT HAND Performed at [facility], [address], [place], [place] [address]    Special Requests   Final    BOTTLES DRAWN AEROBIC AND ANAEROBIC Blood Culture adequate volume Performed at [facility], [address], [place], [place] [address]    Culture  Setup Time   Final    [facility] POSITIVE COCCI  AEROBIC BOTTLE ONLY Gram Stain Report Called to,Read Back By and Verified With: S. STONE [id number] [id number], VIRAY,J CRITICAL RESULT CALLED TO, READ BACK BY AND VERIFIED WITH: PHARMD Lorie P on [id number] @1127  by SM Performed at [facility], [address], [place], [place] [address]    Culture STREPTOCOCCUS MITIS/ORALIS (A)  Final   Report Status 01/16/2024 FINAL  Final   Organism ID, Bacteria STREPTOCOCCUS MITIS/ORALIS  Final      Susceptibility   Streptococcus mitis/oralis - MIC*    PENICILLIN <=0.06 SENSITIVE Sensitive     CEFTRIAXONE   <=0.12 SENSITIVE Sensitive     LEVOFLOXACIN 4 INTERMEDIATE Intermediate     VANCOMYCIN  0.5 SENSITIVE Sensitive     * STREPTOCOCCUS MITIS/ORALIS  Blood Culture ID Panel (Reflexed)     Status: Abnormal   Collection Time: 01/13/24 12:57 AM  Result Value Ref Range Status   Enterococcus faecalis NOT DETECTED NOT DETECTED Final   Enterococcus Faecium NOT DETECTED NOT DETECTED Final   Listeria monocytogenes NOT DETECTED NOT DETECTED Final   Staphylococcus species NOT DETECTED NOT DETECTED Final   Staphylococcus aureus (BCID) NOT DETECTED NOT DETECTED Final   Staphylococcus epidermidis NOT DETECTED NOT DETECTED Final   Staphylococcus lugdunensis NOT DETECTED NOT DETECTED Final   Streptococcus species DETECTED (A) NOT DETECTED Final    Comment: Not Enterococcus species, Streptococcus agalactiae, Streptococcus pyogenes, or Streptococcus pneumoniae. CRITICAL RESULT CALLED TO, READ BACK BY AND VERIFIED WITH: PHARMD Lorie P on [id number] @1127  by SM    Streptococcus agalactiae NOT DETECTED NOT DETECTED Final   Streptococcus pneumoniae NOT DETECTED NOT DETECTED Final   Streptococcus pyogenes NOT DETECTED NOT DETECTED Final   A.calcoaceticus-baumannii NOT DETECTED NOT DETECTED Final   Bacteroides fragilis NOT DETECTED NOT DETECTED Final   Enterobacterales NOT DETECTED NOT DETECTED Final   Enterobacter cloacae complex NOT DETECTED NOT DETECTED Final   Escherichia coli NOT DETECTED NOT DETECTED Final   Klebsiella aerogenes NOT DETECTED NOT DETECTED Final   Klebsiella oxytoca NOT DETECTED NOT DETECTED Final   Klebsiella pneumoniae NOT DETECTED NOT DETECTED Final   Proteus species NOT DETECTED NOT DETECTED Final   Salmonella species NOT DETECTED NOT DETECTED Final   Serratia marcescens NOT DETECTED NOT DETECTED Final   Haemophilus influenzae NOT DETECTED NOT DETECTED Final   Neisseria meningitidis NOT DETECTED NOT DETECTED Final   Pseudomonas aeruginosa NOT DETECTED NOT DETECTED Final   Stenotrophomonas  maltophilia NOT DETECTED NOT DETECTED Final   Candida albicans NOT DETECTED NOT DETECTED Final   Candida auris NOT DETECTED NOT DETECTED Final   Candida glabrata NOT DETECTED NOT DETECTED Final   Candida krusei NOT DETECTED NOT DETECTED Final   Candida parapsilosis NOT DETECTED NOT DETECTED Final   Candida tropicalis NOT DETECTED NOT DETECTED Final   Cryptococcus neoformans/gattii NOT DETECTED NOT DETECTED Final    Comment: Performed at [facility], [address] [place], [place] [address]  Blood culture (routine x 2)     Status: None   Collection Time: 01/13/24  1:06 AM   Specimen: BLOOD LEFT HAND  Result Value Ref Range Status   Specimen Description BLOOD LEFT HAND  Final   Special Requests   Final    BOTTLES DRAWN AEROBIC AND ANAEROBIC Blood Culture adequate volume   Culture   Final    NO GROWTH 5 DAYS Performed at [facility], [address], [place], [place] [address]    Report Status 01/18/2024 FINAL  Final  Aerobic/Anaerobic Culture w Gram Stain (surgical/deep wound)  Status: None   Collection Time: 01/13/24  1:37 PM   Specimen: Abscess  Result Value Ref Range Status   Specimen Description ABSCESS ABDOMEN  Final   Special Requests RLQ  Final   Gram Stain   Final    ABUNDANT WBC PRESENT, PREDOMINANTLY PMN ABUNDANT GRAM NEGATIVE RODS FEW GRAM POSITIVE COCCI    Culture   Final    ABUNDANT ESCHERICHIA COLI MODERATE STREPTOCOCCUS ANGINOSIS RARE KLEBSIELLA PNEUMONIAE ABUNDANT BACTEROIDES FRAGILIS BETA LACTAMASE POSITIVE Performed at [facility], [address] [place], [place] [address]    Report Status 01/17/2024 FINAL  Final   Organism ID, Bacteria ESCHERICHIA COLI  Final   Organism ID, Bacteria STREPTOCOCCUS ANGINOSIS  Final   Organism ID, Bacteria KLEBSIELLA PNEUMONIAE  Final      Susceptibility   Escherichia coli - MIC*    AMPICILLIN 8 SENSITIVE Sensitive     CEFEPIME <=0.12 SENSITIVE Sensitive     CEFTAZIDIME <=1 SENSITIVE Sensitive      CEFTRIAXONE  <=0.25 SENSITIVE Sensitive     CIPROFLOXACIN  <=0.25 SENSITIVE Sensitive     GENTAMICIN <=1 SENSITIVE Sensitive     IMIPENEM <=0.25 SENSITIVE Sensitive     TRIMETH/SULFA <=20 SENSITIVE Sensitive     AMPICILLIN/SULBACTAM <=2 SENSITIVE Sensitive     PIP/TAZO <=4 SENSITIVE Sensitive ug/mL    * ABUNDANT ESCHERICHIA COLI   Klebsiella pneumoniae - MIC*    AMPICILLIN >=32 RESISTANT Resistant     CEFEPIME <=0.12 SENSITIVE Sensitive     CEFTAZIDIME <=1 SENSITIVE Sensitive     CEFTRIAXONE  <=0.25 SENSITIVE Sensitive     CIPROFLOXACIN  <=0.25 SENSITIVE Sensitive     GENTAMICIN <=1 SENSITIVE Sensitive     IMIPENEM 0.5 SENSITIVE Sensitive     TRIMETH/SULFA <=20 SENSITIVE Sensitive     AMPICILLIN/SULBACTAM 4 SENSITIVE Sensitive     PIP/TAZO <=4 SENSITIVE Sensitive ug/mL    * RARE KLEBSIELLA PNEUMONIAE   Streptococcus anginosis - MIC*    PENICILLIN <=0.06 SENSITIVE Sensitive     CEFTRIAXONE  <=0.12 SENSITIVE Sensitive     ERYTHROMYCIN  <=0.12 SENSITIVE Sensitive     LEVOFLOXACIN 0.5 SENSITIVE Sensitive     VANCOMYCIN  0.5 SENSITIVE Sensitive     * MODERATE STREPTOCOCCUS ANGINOSIS  Culture, blood (Routine X 2) w Reflex to ID Panel     Status: None (Preliminary result)   Collection Time: 01/16/24  9:28 AM   Specimen: BLOOD  Result Value Ref Range Status   Specimen Description BLOOD BLOOD RIGHT HAND  Final   Special Requests   Final    BOTTLES DRAWN AEROBIC ONLY Blood Culture results may not be optimal due to an inadequate volume of blood received in culture bottles   Culture   Final    NO GROWTH 2 DAYS Performed at [facility], [address], [place], [place] [address]    Report Status PENDING  Incomplete  Culture, blood (Routine X 2) w Reflex to ID Panel     Status: None (Preliminary result)   Collection Time: 01/16/24  9:31 AM   Specimen: BLOOD  Result Value Ref Range Status   Specimen Description BLOOD BLOOD LEFT HAND  Final   Special Requests   Final    BOTTLES DRAWN AEROBIC  AND ANAEROBIC Blood Culture adequate volume   Culture   Final    NO GROWTH 2 DAYS Performed at [facility], [address] [place], [place] [address]    Report Status PENDING  Incomplete     Discharge Instructions:   Discharge Instructions  Diet - low sodium heart healthy   Complete by: As directed    Diet Carb Modified   Complete by: As directed    Discharge instructions   Complete by: As directed    Can use an over the counter probiotic whil on abx to help with GI symptoms No need to follow up with IR now that drain has been removed   Increase activity slowly   Complete by: As directed    No wound care   Complete by: As directed       Allergies as of 01/18/2024       Reactions   Penicillins Hives, Rash, Other (See Comments)   Has patient had a PCN reaction causing immediate rash, facial/tongue/throat swelling, SOB or lightheadedness with hypotension: No Has patient had a PCN reaction causing severe rash involving mucus membranes or skin necrosis: No Has patient had a PCN reaction that required hospitalization: No Has patient had a PCN reaction occurring within the last 10 years: No If all of the above answers are NO, then may proceed with Cephalosporin use.        Medication List     TAKE these medications    albuterol  108 (90 Base) MCG/ACT inhaler Commonly known as: VENTOLIN  HFA Inhale 2 puffs into the lungs every 6 (six) hours as needed for wheezing or shortness of breath.   atorvastatin  20 MG tablet Commonly known as: LIPITOR  Take 20 mg by mouth daily.   brimonidine  0.2 % ophthalmic solution Commonly known as: ALPHAGAN  Place 1 drop into both eyes 2 (two) times daily.   cefadroxil  500 MG capsule Commonly known as: DURICEF Take 2 capsules (1,000 mg total) by mouth 2 (two) times daily.   escitalopram  10 MG tablet Commonly known as: LEXAPRO  Take 10 mg by mouth daily.   latanoprost  0.005 % ophthalmic solution Commonly known as: XALATAN  Place 1  drop into both eyes at bedtime.   levothyroxine  75 MCG tablet Commonly known as: SYNTHROID  Take 75 mcg by mouth daily before breakfast.   metroNIDAZOLE  500 MG tablet Commonly known as: FLAGYL  Take 1 tablet (500 mg total) by mouth every 12 (twelve) hours.   sodium chloride  flush 0.9 % Soln injection Flush the drain with 5 mL normal saline every day for 20 days. Measure and record output.        Follow-up Information     Mavis Anes, MD. Schedule an appointment as soon as possible for a visit on 02/11/2024.   Specialty: General Surgery Contact information: [address] [place] [address] [phone]         Luetta Chew, MD Follow up.   Specialty: Internal Medicine Contact information: [address] [place] [phone]         Philip Cornet, MD Follow up.   Specialties: Interventional Radiology, Radiology Why: Drain follow up at the [facility]. Our schedulers will call you to set up the appointment. Contact information: [address] [place] [address] [phone]                  Time coordinating discharge: 45 minutes  Signed:  Harlene [name] DO  [facility] 01/18/2024, 11:16 AM

## 2024-01-21 LAB — CULTURE, BLOOD (ROUTINE X 2)
Culture: NO GROWTH
Culture: NO GROWTH
Special Requests: ADEQUATE

## 2024-01-27 ENCOUNTER — Other Ambulatory Visit (HOSPITAL_COMMUNITY): Payer: Self-pay

## 2024-02-11 ENCOUNTER — Encounter: Payer: Self-pay | Admitting: General Surgery

## 2024-02-11 ENCOUNTER — Ambulatory Visit (INDEPENDENT_AMBULATORY_CARE_PROVIDER_SITE_OTHER): Payer: Self-pay | Admitting: General Surgery

## 2024-02-11 VITALS — BP 125/84 | HR 75 | Temp 98.1°F | Resp 14 | Ht 61.0 in | Wt 188.0 lb

## 2024-02-11 DIAGNOSIS — K3532 Acute appendicitis with perforation and localized peritonitis, without abscess: Secondary | ICD-10-CM

## 2024-02-11 NOTE — Patient Instructions (Signed)
 [facility],  Dr. Jerel Sieving Dr. Glendia Fielding - [place] Dr. Rollene Pesa - [place]

## 2024-02-12 NOTE — Progress Notes (Signed)
 Subjective:     Caitlyn Anderson  Patient here for follow-up of hospitalization, status post percutaneous drainage of acute appendicitis with abscess.  The patient's catheter was removed on hospital day 5.  She was continued on Augmentin for a total of 2 weeks.  She denies any fever or chills.  She has had no significant right lower quadrant abdominal pain.  Her bowel movements have returned to normal. She states she had a colonoscopy approximately 5 to 6 years ago which was for the most part unremarkable. 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 Objective:    BP 125/84   Pulse 75   Temp 98.1 F (36.7 C) (Oral)   Resp 14   Ht 5' 1 (1.549 m)   Wt 188 lb (85.3 kg)   SpO2 94%   BMI 35.52 kg/m   General:  alert, cooperative, and no distress  Abdomen soft, nontender, nondistended.  No right lower quadrant fullness or pain is noted.     Assessment:    Doing well, status post percutaneous drainage of appendiceal abscess.  Clinically, the patient's infection has resolved.    Plan:   Given her significant history of subglottis tracheal stenosis, I told her the risks of undergoing general anesthesia to do an interval appendectomy outweigh the benefits at this point.  She understands and agrees.  She does realize that she can have an episode of recurrent appendicitis.  Should she develop any of these signs or symptoms, she was instructed to go to a tertiary care center as [facility] would not be able to handle the anesthesia portion of the procedure.  Follow-up as needed.

## 2024-04-09 ENCOUNTER — Encounter (INDEPENDENT_AMBULATORY_CARE_PROVIDER_SITE_OTHER): Payer: Self-pay | Admitting: *Deleted
